# Patient Record
Sex: Male | Born: 1955
Health system: Southern US, Community
[De-identification: ages and names within clinical notes are randomized; demographics above are authoritative.]

## PROBLEM LIST (undated history)

## (undated) DIAGNOSIS — H409 Unspecified glaucoma: Secondary | ICD-10-CM

## (undated) DIAGNOSIS — T7840XA Allergy, unspecified, initial encounter: Secondary | ICD-10-CM

## (undated) DIAGNOSIS — E119 Type 2 diabetes mellitus without complications: Secondary | ICD-10-CM

## (undated) DIAGNOSIS — N400 Enlarged prostate without lower urinary tract symptoms: Secondary | ICD-10-CM

## (undated) DIAGNOSIS — E1169 Type 2 diabetes mellitus with other specified complication: Secondary | ICD-10-CM

## (undated) DIAGNOSIS — I10 Essential (primary) hypertension: Secondary | ICD-10-CM

## (undated) HISTORY — DX: Obesity, unspecified: E11.69

## (undated) HISTORY — DX: Benign prostatic hyperplasia without lower urinary tract symptoms: N40.0

## (undated) HISTORY — DX: Allergy, unspecified, initial encounter: T78.40XA

## (undated) HISTORY — DX: Type 2 diabetes mellitus without complications: E11.9

## (undated) HISTORY — DX: Unspecified glaucoma: H40.9

## (undated) HISTORY — PX: OTHER SURGICAL HISTORY: SHX169

## (undated) HISTORY — PX: TONSILLECTOMY: SUR1361

## (undated) HISTORY — DX: Essential (primary) hypertension: I10

## (undated) HISTORY — DX: Type 2 diabetes mellitus with other specified complication: E66.9

---

## 2015-01-14 DIAGNOSIS — F988 Other specified behavioral and emotional disorders with onset usually occurring in childhood and adolescence: Secondary | ICD-10-CM | POA: Insufficient documentation

## 2015-01-14 DIAGNOSIS — M62830 Muscle spasm of back: Secondary | ICD-10-CM | POA: Insufficient documentation

## 2015-01-14 DIAGNOSIS — E669 Obesity, unspecified: Secondary | ICD-10-CM | POA: Insufficient documentation

## 2015-01-14 DIAGNOSIS — L719 Rosacea, unspecified: Secondary | ICD-10-CM | POA: Insufficient documentation

## 2015-01-14 DIAGNOSIS — R030 Elevated blood-pressure reading, without diagnosis of hypertension: Secondary | ICD-10-CM | POA: Insufficient documentation

## 2015-01-15 DIAGNOSIS — R7989 Other specified abnormal findings of blood chemistry: Secondary | ICD-10-CM | POA: Insufficient documentation

## 2016-02-03 DIAGNOSIS — Z0000 Encounter for general adult medical examination without abnormal findings: Secondary | ICD-10-CM | POA: Insufficient documentation

## 2016-04-03 DIAGNOSIS — Z87898 Personal history of other specified conditions: Secondary | ICD-10-CM | POA: Insufficient documentation

## 2016-04-03 DIAGNOSIS — G8929 Other chronic pain: Secondary | ICD-10-CM | POA: Insufficient documentation

## 2018-01-17 ENCOUNTER — Encounter: Payer: Self-pay | Admitting: Family Medicine

## 2018-01-17 ENCOUNTER — Ambulatory Visit (INDEPENDENT_AMBULATORY_CARE_PROVIDER_SITE_OTHER): Payer: BLUE CROSS/BLUE SHIELD | Admitting: Family Medicine

## 2018-01-17 ENCOUNTER — Other Ambulatory Visit: Payer: Self-pay | Admitting: Family Medicine

## 2018-01-17 VITALS — BP 112/68 | HR 72 | Temp 98.0°F | Ht 73.0 in | Wt 254.0 lb

## 2018-01-17 DIAGNOSIS — Z125 Encounter for screening for malignant neoplasm of prostate: Secondary | ICD-10-CM | POA: Diagnosis not present

## 2018-01-17 DIAGNOSIS — Z0000 Encounter for general adult medical examination without abnormal findings: Secondary | ICD-10-CM

## 2018-01-17 DIAGNOSIS — Z1211 Encounter for screening for malignant neoplasm of colon: Secondary | ICD-10-CM

## 2018-01-17 DIAGNOSIS — N400 Enlarged prostate without lower urinary tract symptoms: Secondary | ICD-10-CM | POA: Insufficient documentation

## 2018-01-17 DIAGNOSIS — N401 Enlarged prostate with lower urinary tract symptoms: Secondary | ICD-10-CM

## 2018-01-17 DIAGNOSIS — Z114 Encounter for screening for human immunodeficiency virus [HIV]: Secondary | ICD-10-CM | POA: Diagnosis not present

## 2018-01-17 DIAGNOSIS — R350 Frequency of micturition: Secondary | ICD-10-CM

## 2018-01-17 DIAGNOSIS — R739 Hyperglycemia, unspecified: Secondary | ICD-10-CM

## 2018-01-17 LAB — COMPREHENSIVE METABOLIC PANEL
ALT: 26 U/L (ref 0–53)
AST: 16 U/L (ref 0–37)
Albumin: 3.9 g/dL (ref 3.5–5.2)
Alkaline Phosphatase: 75 U/L (ref 39–117)
BUN: 22 mg/dL (ref 6–23)
CO2: 30 meq/L (ref 19–32)
Calcium: 8.8 mg/dL (ref 8.4–10.5)
Chloride: 100 mEq/L (ref 96–112)
Creatinine, Ser: 0.92 mg/dL (ref 0.40–1.50)
GFR: 87.98 mL/min (ref >60.00)
Glucose, Bld: 180 mg/dL — ABNORMAL HIGH (ref 70–99)
POTASSIUM: 4.1 meq/L (ref 3.5–5.1)
Sodium: 138 mEq/L (ref 135–145)
Total Bilirubin: 1.1 mg/dL (ref 0.2–1.2)
Total Protein: 6.1 g/dL (ref 6.0–8.3)

## 2018-01-17 LAB — LIPID PANEL
Cholesterol: 150 mg/dL (ref 0–200)
HDL: 38 mg/dL — ABNORMAL LOW (ref >39.00)
LDL Cholesterol: 93 mg/dL (ref 0–99)
NONHDL: 111.52
Total CHOL/HDL Ratio: 4
Triglycerides: 95 mg/dL (ref 0.0–149.0)
VLDL: 19 mg/dL (ref 0.0–40.0)

## 2018-01-17 LAB — PSA: PSA: 1.29 ng/mL (ref 0.10–4.00)

## 2018-01-17 MED ORDER — TAMSULOSIN HCL 0.4 MG PO CAPS: 0.4000 mg | ORAL_CAPSULE | Freq: Every day | ORAL | 3 refills | Status: DC

## 2018-01-17 NOTE — Patient Instructions (Addendum)
Get me the info for when you got your Shingrix (shingles vaccine).  Give [place] 2-3 business days to get the results of your labs back.   Keep the diet clean and stay active.  Consider lifting weights to help with energy and functionality moving forward.  The biggest side effect from this medication is getting dizzy when you stand up. Stand up slowly.  Let us know if you need anything.

## 2018-01-17 NOTE — Progress Notes (Signed)
Chief Complaint  Patient presents with  . New Patient (Initial Visit)    [name] is here for a complete physical.   His last physical was >1 year ago.  Current diet: in general, an "OK" diet.  Current exercise: walking Weight trend: stable, has trouble losing weight they way he used to Daytime fatigue? No. Seat belt? Yes.    Health maintenance Colonoscopy- No Tetanus- Yes - 2 years ago HIV- No Hep C- No   Hx of freq urination, near incontinence and dribbling stream. Was placed on Flomax in past for kidney stone and it did wonders for his stream. Interested in this med. He has been screened with PSAs in past and all have been WNL. No bleeding or pain.    Past Medical History:  Diagnosis Date  . BPH (benign prostatic hyperplasia)   . Glaucoma   . Hypertension      History reviewed. No pertinent surgical history.  Medications  Current Outpatient Medications on File Prior to Visit  Medication Sig Dispense Refill  . amLODipine (NORVASC) 5 MG tablet Take 5 mg by mouth daily.    . Coenzyme Q10 (COQ10) 100 MG CAPS Take by mouth.    . Cyanocobalamin (VITAMIN B 12 PO) Take 1,000 mg by mouth daily.    . Ergocalciferol (VITAMIN D2) 50 MCG (2000 UT) TABS Take 2,000 mg by mouth daily.    . fexofenadine-pseudoephedrine (ALLEGRA-D 24) 180-240 MG 24 hr tablet Take 1 tablet by mouth daily.    Jon Townsend Oil 1000 MG CAPS Take 1,000 mg by mouth daily.    . Magnesium 400 MG CAPS Take 400 mg by mouth daily.    . metoprolol tartrate (LOPRESSOR) 50 MG tablet Take 50 mg by mouth 2 (two) times daily.    . TURMERIC CURCUMIN PO Take 750 capsules by mouth daily.     Allergies No Known Allergies  Family History Family History  Problem Relation Age of Onset  . Cancer Father   . Early death Father   . Diabetes Sister   . Heart disease Sister   . COPD Sister   . Diabetes Sister     Review of Systems: Constitutional:  no fevers Eye:  no recent significant change in  vision Ear/Nose/Mouth/Throat:  Ears:  no hearing loss Nose/Mouth/Throat:  no complaints of nasal congestion, no sore throat, +hoarseness (getting over cold) Cardiovascular:  no chest pain, no palpitations Respiratory:  no cough and no shortness of breath Gastrointestinal:  no abdominal pain, no change in bowel habits GU:  Male: negative for dysuria, frequency, and incontinence and positive for prostate symptoms Musculoskeletal/Extremities:  no pain, redness, or swelling of the joints Integumentary (Skin/Breast):  no abnormal skin lesions reported Neurologic:  no headaches Endocrine: No unexpected weight changes Hematologic/Lymphatic:  no abnormal bleeding  Exam BP 112/68 (BP Location: Left Arm, Patient Position: Sitting, Cuff Size: Large)   Pulse 72   Temp 98 F (36.7 C) (Oral)   Ht 6\' 1"  (1.854 m)   Wt 254 lb (115.2 kg)   SpO2 97%   BMI 33.51 kg/m  General:  well developed, well nourished, in no apparent distress Skin:  no significant moles, warts, or growths Head:  no masses, lesions, or tenderness Eyes:  pupils equal and round, sclera anicteric without injection Ears:  canals without lesions, TMs shiny without retraction, no obvious effusion, no erythema Nose:  nares patent, septum midline, mucosa normal Throat/Pharynx:  lips and gingiva without lesion; tongue and uvula midline; non-inflamed  pharynx; no exudates or postnasal drainage Neck: neck supple without adenopathy, thyromegaly, or masses Lungs:  clear to auscultation, breath sounds equal bilaterally, no respiratory distress Cardio:  regular rate and rhythm, no LE edema, no bruits Abdomen:  abdomen soft, nontender; bowel sounds normal; no masses or organomegaly Genital (male): circumcised penis, no lesions or discharge; testes present bilaterally without masses or tenderness Rectal: Deferred Musculoskeletal:  symmetrical muscle groups noted without atrophy or deformity Extremities:  no clubbing, cyanosis, or edema, no  deformities, no skin discoloration Neuro:  gait normal; deep tendon reflexes normal and symmetric Psych: well oriented with normal range of affect and appropriate judgment/insight  Assessment and Plan  Well adult exam - Plan: Comprehensive metabolic panel, Lipid panel  Benign prostatic hyperplasia with urinary frequency  Screening for prostate cancer - Plan: PSA  Screening for HIV (human immunodeficiency virus) - Plan: HIV Antibody (routine testing w rflx)  Screen for colon cancer - Plan: Ambulatory referral to Gastroenterology   Well 63 y.o. male. Counseled on diet and exercise. Counseled on risks and benefits of prostate cancer screening with PSA. The patient agrees to undergo testing. Immunizations, labs, and further orders as above. Follow up in 4 weeks to reck urination. The patient voiced understanding and agreement to the plan.  [place], DO 01/17/18 9:22 AM

## 2018-01-17 NOTE — Progress Notes (Signed)
Pre visit review using our clinic review tool, if applicable. No additional management support is needed unless otherwise documented below in the visit note. 

## 2018-01-18 ENCOUNTER — Other Ambulatory Visit (INDEPENDENT_AMBULATORY_CARE_PROVIDER_SITE_OTHER): Payer: BLUE CROSS/BLUE SHIELD

## 2018-01-18 ENCOUNTER — Encounter: Payer: Self-pay | Admitting: Family Medicine

## 2018-01-18 DIAGNOSIS — E1169 Type 2 diabetes mellitus with other specified complication: Secondary | ICD-10-CM | POA: Insufficient documentation

## 2018-01-18 DIAGNOSIS — R739 Hyperglycemia, unspecified: Secondary | ICD-10-CM

## 2018-01-18 DIAGNOSIS — E669 Obesity, unspecified: Secondary | ICD-10-CM

## 2018-01-18 DIAGNOSIS — E119 Type 2 diabetes mellitus without complications: Secondary | ICD-10-CM | POA: Insufficient documentation

## 2018-01-18 LAB — HIV ANTIBODY (ROUTINE TESTING W REFLEX): HIV 1&2 Ab, 4th Generation: NONREACTIVE

## 2018-01-18 LAB — HEMOGLOBIN A1C: Hgb A1c MFr Bld: 9.8 % — ABNORMAL HIGH (ref 4.6–6.5)

## 2018-01-23 ENCOUNTER — Encounter: Payer: Self-pay | Admitting: Family Medicine

## 2018-01-23 ENCOUNTER — Ambulatory Visit: Payer: BLUE CROSS/BLUE SHIELD | Admitting: Family Medicine

## 2018-01-23 VITALS — BP 118/64 | HR 76 | Temp 98.0°F | Ht 73.0 in | Wt 255.5 lb

## 2018-01-23 DIAGNOSIS — E1169 Type 2 diabetes mellitus with other specified complication: Secondary | ICD-10-CM | POA: Diagnosis not present

## 2018-01-23 DIAGNOSIS — E669 Obesity, unspecified: Secondary | ICD-10-CM | POA: Diagnosis not present

## 2018-01-23 DIAGNOSIS — Z23 Encounter for immunization: Secondary | ICD-10-CM

## 2018-01-23 LAB — GLUCOSE, POCT (MANUAL RESULT ENTRY): POC GLUCOSE: 327 mg/dL — AB (ref 70–99)

## 2018-01-23 LAB — MICROALBUMIN / CREATININE URINE RATIO
Creatinine,U: 134.8 mg/dL
MICROALB/CREAT RATIO: 0.5 mg/g (ref 0.0–30.0)
Microalb, Ur: <0.7 mg/dL (ref 0.0–1.9)

## 2018-01-23 MED ORDER — ATORVASTATIN CALCIUM 40 MG PO TABS: 40.0000 mg | ORAL_TABLET | Freq: Every day | ORAL | 3 refills | Status: DC

## 2018-01-23 MED ORDER — GLUCOSE BLOOD VI STRP: ORAL_STRIP | 3 refills | Status: DC

## 2018-01-23 MED ORDER — AZITHROMYCIN 250 MG PO TABS: ORAL_TABLET | ORAL | 0 refills | Status: DC

## 2018-01-23 MED ORDER — ONETOUCH ULTRASOFT LANCETS MISC: 3 refills | Status: DC

## 2018-01-23 MED ORDER — ONETOUCH VERIO W/DEVICE KIT: PACK | 0 refills | Status: DC

## 2018-01-23 MED ORDER — METFORMIN HCL 500 MG PO TABS: ORAL_TABLET | ORAL | 1 refills | Status: DC

## 2018-01-23 NOTE — Addendum Note (Signed)
Addended by: Sharon Seller B on: 01/23/2018 10:42 AM   Modules accepted: Orders

## 2018-01-23 NOTE — Patient Instructions (Signed)
Give [place] 2-3 business days to get the results of your labs back.   Aim to do some physical exertion for 150 minutes per week. This is typically divided into 5 days per week, 30 minutes per day. The activity should be enough to get your heart rate up. Anything is better than nothing if you have time constraints.  Call your eye doctor to let them know about your diagnosis and they will do a special exam once a year.  Healthy Eating Plan Many factors influence your heart health, including eating and exercise habits. Heart (coronary) risk increases with abnormal blood fat (lipid) levels. Heart-healthy meal planning includes limiting unhealthy fats, increasing healthy fats, and making other small dietary changes. This includes maintaining a healthy body weight to help keep lipid levels within a normal range.  WHAT IS MY PLAN?  Your health care provider recommends that you:  Drink a glass of water before meals to help with satiety.  Eat slowly.  An alternative to the water is to add Metamucil. This will help with satiety as well. It does contain calories, unlike water.  WHAT TYPES OF FAT SHOULD I CHOOSE?  Choose healthy fats more often. Choose monounsaturated and polyunsaturated fats, such as olive oil and canola oil, flaxseeds, walnuts, almonds, and seeds.  Eat more omega-3 fats. Good choices include salmon, mackerel, sardines, tuna, flaxseed oil, and ground flaxseeds. Aim to eat fish at least two times each week.  Avoid foods with partially hydrogenated oils in them. These contain trans fats. Examples of foods that contain trans fats are stick margarine, some tub margarines, cookies, crackers, and other baked goods. If you are going to avoid a fat, this is the one to avoid!  WHAT GENERAL GUIDELINES DO I NEED TO FOLLOW?  Check food labels carefully to identify foods with trans fats. Avoid these types of options when possible.  Fill one half of your plate with vegetables and green salads. Eat  4-5 servings of vegetables per day. A serving of vegetables equals 1 cup of raw leafy vegetables,  cup of raw or cooked cut-up vegetables, or  cup of vegetable juice.  Fill one fourth of your plate with whole grains. Look for the word "whole" as the first word in the ingredient list.  Fill one fourth of your plate with lean protein foods.  Eat 4-5 servings of fruit per day. A serving of fruit equals one medium whole fruit,  cup of dried fruit,  cup of fresh, frozen, or canned fruit. Try to avoid fruits in cups/syrups as the sugar content can be high.  Eat more foods that contain soluble fiber. Examples of foods that contain this type of fiber are apples, broccoli, carrots, beans, peas, and barley. Aim to get 20-30 g of fiber per day.  Eat more home-cooked food and less restaurant, buffet, and fast food.  Limit or avoid alcohol.  Limit foods that are high in starch and sugar.  Avoid fried foods when able.  Cook foods by using methods other than frying. Baking, boiling, grilling, and broiling are all great options. Other fat-reducing suggestions include: ? Removing the skin from poultry. ? Removing all visible fats from meats. ? Skimming the fat off of stews, soups, and gravies before serving them. ? Steaming vegetables in water or broth.  Lose weight if you are overweight. Losing just 5-10% of your initial body weight can help your overall health and prevent diseases such as diabetes and heart disease.  Increase your consumption of nuts,  legumes, and seeds to 4-5 servings per week. One serving of dried beans or legumes equals  cup after being cooked, one serving of nuts equals 1 ounces, and one serving of seeds equals  ounce or 1 tablespoon.  WHAT ARE GOOD FOODS CAN I EAT? Grains Grainy breads (try to find bread that is 3 g of fiber per slice or greater), oatmeal, light popcorn. Whole-grain cereals. Rice and pasta, including brown rice and those that are made with whole wheat.  Edamame pasta is a great alternative to grain pasta. It has a higher protein content. Try to avoid significant consumption of white bread, sugary cereals, or pastries/baked goods.  Vegetables All vegetables. Cooked white potatoes do not count as vegetables.  Fruits All fruits, but limit pineapple and bananas as these fruits have a higher sugar content.  Meats and Other Protein Sources Lean, well-trimmed beef, veal, pork, and lamb. Chicken and [place] without skin. All fish and shellfish. Wild duck, rabbit, pheasant, and venison. Egg whites or low-cholesterol egg substitutes. Dried beans, peas, lentils, and tofu.Seeds and most nuts.  Dairy Low-fat or nonfat cheeses, including ricotta, string, and mozzarella. Skim or 1% milk that is liquid, powdered, or evaporated. Buttermilk that is made with low-fat milk. Nonfat or low-fat yogurt. Soy/Almond milk are good alternatives if you cannot handle dairy.  Beverages Water is the best for you. Sports drinks with less sugar are more desirable unless you are a highly active athlete.  Sweets and Desserts Sherbets and fruit ices. Honey, jam, marmalade, jelly, and syrups. Dark chocolate.  Eat all sweets and desserts in moderation.  Fats and Oils Nonhydrogenated (trans-free) margarines. Vegetable oils, including soybean, sesame, sunflower, olive, peanut, safflower, corn, canola, and cottonseed. Salad dressings or mayonnaise that are made with a vegetable oil. Limit added fats and oils that you use for cooking, baking, salads, and as spreads.  Other Cocoa powder. Coffee and tea. Most condiments.  The items listed above may not be a complete list of recommended foods or beverages. Contact your dietitian for more options.

## 2018-01-23 NOTE — Progress Notes (Signed)
Chief Complaint  Patient presents with  . Diabetes    Subjective: Patient is a 63 y.o. male here for f/u a1c.  A1c 9.8 from last visit. Here to discuss next steps. Has questions about carbs/exercise/prognosis. He does not know how to use a glucometer. He does have an eye provider.   Objective: BP 118/64 (BP Location: Left Arm, Patient Position: Sitting, Cuff Size: Large)   Pulse 76   Temp 98 F (36.7 C) (Oral)   Ht 6\' 1"  (1.854 m)   Wt 255 lb 8 oz (115.9 kg)   SpO2 96%   BMI 33.71 kg/m  General: Awake, appears stated age Heart: 2+ DP pulses b/l feet Skin: No ext lesions noted Neuro: Sensation intact to pinprick b/l Lungs: No accessory muscle use Psych: Age appropriate judgment and insight, normal affect and mood  Assessment and Plan: Diabetes mellitus type 2 in obese (HCC) - Plan: Microalbumin / creatinine urine ratio, metFORMIN (GLUCOPHAGE) 500 MG tablet, HM DIABETES FOOT EXAM, atorvastatin (LIPITOR) 40 MG tablet  Start Metformin and statin. Glucometer and supplies called in. Counseled on diet and exercise, healthy diet handout given. Ft exam.  F/u in 3 mo.  The patient and his spouse voiced understanding and agreement to the plan.  Greater than 25 minutes were spent face to face with the patient with greater than 50% of this time spent counseling on diet, exercise, prognosis, complications of diabetes, and teaching with glucometer.   [place], DO 01/23/18  10:27 AM

## 2018-01-23 NOTE — Addendum Note (Signed)
Addended by: Sharon Seller B on: 01/23/2018 10:47 AM   Modules accepted: Orders

## 2018-01-25 ENCOUNTER — Encounter: Payer: Self-pay | Admitting: Gastroenterology

## 2018-01-29 ENCOUNTER — Ambulatory Visit (AMBULATORY_SURGERY_CENTER): Payer: Self-pay

## 2018-01-29 ENCOUNTER — Encounter: Payer: Self-pay | Admitting: Gastroenterology

## 2018-01-29 VITALS — Ht 73.0 in | Wt 252.2 lb

## 2018-01-29 DIAGNOSIS — Z1211 Encounter for screening for malignant neoplasm of colon: Secondary | ICD-10-CM

## 2018-01-29 MED ORDER — NA SULFATE-K SULFATE-MG SULF 17.5-3.13-1.6 GM/177ML PO SOLN: 1.0000 | Freq: Once | ORAL | 0 refills | Status: AC

## 2018-01-29 NOTE — Progress Notes (Signed)
Denies allergies to eggs or soy products. Denies complication of anesthesia or sedation. Denies use of weight loss medication. Denies use of O2.   Emmi instructions declined.   A 15.00 coupon for Suprep was given to the patient.

## 2018-02-12 ENCOUNTER — Encounter: Payer: Self-pay | Admitting: Family Medicine

## 2018-02-12 MED ORDER — AMLODIPINE BESYLATE 5 MG PO TABS: 5.0000 mg | ORAL_TABLET | Freq: Every day | ORAL | 1 refills | Status: DC

## 2018-02-13 ENCOUNTER — Ambulatory Visit (AMBULATORY_SURGERY_CENTER): Payer: BLUE CROSS/BLUE SHIELD | Admitting: Gastroenterology

## 2018-02-13 ENCOUNTER — Encounter: Payer: Self-pay | Admitting: Gastroenterology

## 2018-02-13 VITALS — BP 117/65 | HR 54 | Temp 97.8°F | Resp 15 | Ht 73.0 in | Wt 252.0 lb

## 2018-02-13 DIAGNOSIS — D122 Benign neoplasm of ascending colon: Secondary | ICD-10-CM

## 2018-02-13 DIAGNOSIS — Z1211 Encounter for screening for malignant neoplasm of colon: Secondary | ICD-10-CM

## 2018-02-13 MED ORDER — SODIUM CHLORIDE 0.9 % IV SOLN: 500.0000 mL | Freq: Once | INTRAVENOUS | Status: DC

## 2018-02-13 NOTE — Op Note (Signed)
[facility] Patient Name: Jon Townsend Procedure Date: 02/13/2018 11:40 AM MRN: [id number] Endoscopist: Remo Lipps P. Havery Moros , MD Age: 63 Referring MD:  Date of Birth: September 28, 1955 Gender: Male Account #: [id number] Procedure:                Colonoscopy Indications:              Screening for colorectal malignant neoplasm Medicines:                Monitored Anesthesia Care Procedure:                Pre-Anesthesia Assessment:                           - Prior to the procedure, a History and Physical                            was performed, and patient medications and                            allergies were reviewed. The patient's tolerance of                            previous anesthesia was also reviewed. The risks                            and benefits of the procedure and the sedation                            options and risks were discussed with the patient.                            All questions were answered, and informed consent                            was obtained. Prior Anticoagulants: The patient has                            taken no previous anticoagulant or antiplatelet                            agents. ASA Grade Assessment: II - A patient with                            mild systemic disease. After reviewing the risks                            and benefits, the patient was deemed in                            satisfactory condition to undergo the procedure.                           After obtaining informed consent, the colonoscope  was passed under direct vision. Throughout the                            procedure, the patient's blood pressure, pulse, and                            oxygen saturations were monitored continuously. The                            Colonoscope was introduced through the anus and                            advanced to the the cecum, identified by                            appendiceal orifice  and ileocecal valve. The                            colonoscopy was performed without difficulty. The                            patient tolerated the procedure well. The quality                            of the bowel preparation was fair. The ileocecal                            valve, appendiceal orifice, and rectum were                            photographed. Scope In: 11:47:37 AM Scope Out: 12:01:24 PM Scope Withdrawal Time: 0 hours 7 minutes 42 seconds  Total Procedure Duration: 0 hours 13 minutes 47 seconds  Findings:                 The perianal and digital rectal examinations were                            normal.                           A diminutive polyp was found in the ascending                            colon. The polyp was sessile. The polyp was removed                            with a cold snare. Resection and retrieval were                            complete.                           A moderate amount of semi-liquid stool was found at  the splenic flexure and in the cecum, making                            visualization difficult. There was residual seeds /                            nuts in these areas which could not be cleared,                            clogged the colonoscope. No large polyps or mass                            lesions noted but small or flat polyps may not have                            been appreciated.                           The exam was otherwise without abnormality. Complications:            No immediate complications. Estimated blood loss:                            Minimal. Estimated Blood Loss:     Estimated blood loss was minimal. Impression:               - Preparation of the colon was fair, particularly                            in dependant portions of the colon such as cecum                            and splenic flexure                           - One diminutive polyp in the ascending colon,                             removed with a cold snare. Resected and retrieved.                           - The examination was otherwise normal. Recommendation:           - Patient has a contact number available for                            emergencies. The signs and symptoms of potential                            delayed complications were discussed with the                            patient. Return to normal activities tomorrow.  Written discharge instructions were provided to the                            patient.                           - Resume previous diet.                           - Continue present medications.                           - Await pathology results.                           - Repeat colonoscopy within 1 year because the                            bowel preparation was suboptimal. Remo Lipps P. Coraline Talwar, MD 02/13/2018 12:06:43 PM This report has been signed electronically.

## 2018-02-13 NOTE — Patient Instructions (Signed)
Recommend repeat screening colonoscopy in one year.  Resume previous diet and medications today.  Return to normal activities tomorrow.    YOU HAD AN ENDOSCOPIC PROCEDURE TODAY AT [facility]:   Refer to the procedure report that was given to you for any specific questions about what was found during the examination.  If the procedure report does not answer your questions, please call your gastroenterologist to clarify.  If you requested that your care partner not be given the details of your procedure findings, then the procedure report has been included in a sealed envelope for you to review at your convenience later.  YOU SHOULD EXPECT: Some feelings of bloating in the abdomen. Passage of more gas than usual.  Walking can help get rid of the air that was put into your GI tract during the procedure and reduce the bloating. If you had a lower endoscopy (such as a colonoscopy or flexible sigmoidoscopy) you may notice spotting of blood in your stool or on the toilet paper. If you underwent a bowel prep for your procedure, you may not have a normal bowel movement for a few days.  Please Note:  You might notice some irritation and congestion in your nose or some drainage.  This is from the oxygen used during your procedure.  There is no need for concern and it should clear up in a day or so.  SYMPTOMS TO REPORT IMMEDIATELY:   Following lower endoscopy (colonoscopy or flexible sigmoidoscopy):  Excessive amounts of blood in the stool  Significant tenderness or worsening of abdominal pains  Swelling of the abdomen that is new, acute  Fever of 100F or higher    For urgent or emergent issues, a gastroenterologist can be reached at any hour by calling [phone].   DIET:  We do recommend a small meal at first, but then you may proceed to your regular diet.  Drink plenty of fluids but you should avoid alcoholic beverages for 24 hours.  ACTIVITY:  You should plan to take it  easy for the rest of today and you should NOT DRIVE or use heavy machinery until tomorrow (because of the sedation medicines used during the test).    FOLLOW UP: Our staff will call the number listed on your records the next business day following your procedure to check on you and address any questions or concerns that you may have regarding the information given to you following your procedure. If we do not reach you, we will leave a message.  However, if you are feeling well and you are not experiencing any problems, there is no need to return our call.  We will assume that you have returned to your regular daily activities without incident.  If any biopsies were taken you will be contacted by phone or by letter within the next 1-3 weeks.  Please call us at [phone] if you have not heard about the biopsies in 3 weeks.    SIGNATURES/CONFIDENTIALITY: You and/or your care partner have signed paperwork which will be entered into your electronic medical record.  These signatures attest to the fact that that the information above on your After Visit Summary has been reviewed and is understood.  Full responsibility of the confidentiality of this discharge information lies with you and/or your care-partner.

## 2018-02-13 NOTE — Progress Notes (Signed)
Called to room to assist during endoscopic procedure.  Patient ID and intended procedure confirmed with present staff. Received instructions for my participation in the procedure from the performing physician.  

## 2018-02-13 NOTE — Progress Notes (Signed)
A/ox3, pleased with MAC, report to RN 

## 2018-02-14 ENCOUNTER — Telehealth: Payer: Self-pay

## 2018-02-14 ENCOUNTER — Ambulatory Visit: Payer: BLUE CROSS/BLUE SHIELD | Admitting: Family Medicine

## 2018-02-14 NOTE — Telephone Encounter (Signed)
Attempted to reach patient for post-procedure f/u call. No answer. Left message that we will make another attempt to call him again later today and for him to please not hesitate to call us if he has any questions/concerns regarding his care.

## 2018-02-14 NOTE — Telephone Encounter (Signed)
  Follow up Call-  Call back number 02/13/2018  Post procedure Call Back phone  # [phone]  Permission to leave phone message Yes     Patient questions:  Do you have a fever, pain , or abdominal swelling? No. Pain Score  0 *  Have you tolerated food without any problems? Yes.    Have you been able to return to your normal activities? Yes.    Do you have any questions about your discharge instructions: Diet   No. Medications  No. Follow up visit  No.  Do you have questions or concerns about your Care? No.  Actions: * If pain score is 4 or above: No action needed, pain <4.

## 2018-03-07 LAB — HM DIABETES EYE EXAM

## 2018-03-21 ENCOUNTER — Encounter: Payer: Self-pay | Admitting: Family Medicine

## 2018-03-22 MED ORDER — METOPROLOL TARTRATE 50 MG PO TABS: 50.0000 mg | ORAL_TABLET | Freq: Two times a day (BID) | ORAL | 1 refills | Status: DC

## 2018-04-03 ENCOUNTER — Other Ambulatory Visit: Payer: Self-pay | Admitting: Family Medicine

## 2018-04-03 DIAGNOSIS — E1169 Type 2 diabetes mellitus with other specified complication: Secondary | ICD-10-CM

## 2018-04-03 DIAGNOSIS — E669 Obesity, unspecified: Principal | ICD-10-CM

## 2018-04-03 MED ORDER — METFORMIN HCL 500 MG PO TABS: ORAL_TABLET | ORAL | 3 refills | Status: DC

## 2018-04-23 ENCOUNTER — Encounter: Payer: Self-pay | Admitting: Family Medicine

## 2018-04-25 ENCOUNTER — Ambulatory Visit: Payer: BLUE CROSS/BLUE SHIELD | Admitting: Family Medicine

## 2018-04-26 ENCOUNTER — Other Ambulatory Visit: Payer: Self-pay

## 2018-04-26 ENCOUNTER — Ambulatory Visit (INDEPENDENT_AMBULATORY_CARE_PROVIDER_SITE_OTHER): Payer: BLUE CROSS/BLUE SHIELD | Admitting: Family Medicine

## 2018-04-26 ENCOUNTER — Encounter: Payer: Self-pay | Admitting: Family Medicine

## 2018-04-26 DIAGNOSIS — I10 Essential (primary) hypertension: Secondary | ICD-10-CM | POA: Diagnosis not present

## 2018-04-26 DIAGNOSIS — E669 Obesity, unspecified: Secondary | ICD-10-CM

## 2018-04-26 DIAGNOSIS — E1169 Type 2 diabetes mellitus with other specified complication: Secondary | ICD-10-CM

## 2018-04-26 NOTE — Progress Notes (Addendum)
Subjective:   Chief Complaint  Patient presents with  . Follow-up    diabetes    Jon Townsend is a 63 y.o. male here for follow-up of diabetes. Due to outbreak, we are interacting via web portal for an electronic face-to-face visit. I verified patient's ID using 2 identifiers.   Jon Townsend's self monitored glucose range is low 100's Patient denies hypoglycemic reactions. Patient does not require insulin.   Medications include: Metformin 1000 mg bid Exercise: walking, exercise bands Diet improved, has lost 13 lbs since last visit  Hypertension Patient presents for hypertension follow up. He does monitor home blood pressures. Blood pressures ranging on average from 120-130's/60-70's. He is compliant with medications- Norvasc 10 mg/d, lopressor 25 mg bid. Patient has these side effects of medication: none He is adhering to a healthy diet overall. Exercise: walking   Past Medical History:  Diagnosis Date  . Allergy   . BPH (benign prostatic hyperplasia)   . Diabetes mellitus type 2 in obese ([place])   . Glaucoma   . Hypertension      Related testing: Date of retinal exam: was scheduled then cancelled due to COVID-19 Pneumovax: done Flu Shot: done  Review of Systems: Pulmonary:  No SOB Cardiovascular:  No chest pain  Objective:  No conversational dyspnea Age appropriate judgment and insight Nml affect and mood  Assessment:   Diabetes mellitus type 2 in obese ([place]) - Plan: metFORMIN (GLUCOPHAGE) 500 MG tablet, Hemoglobin A1c  Essential hypertension   Plan:   Orders as above. Cont meds. Counseled on diet and exercise. He is doing well.  Cont BP meds.  F/u in 3-6 mo pending A1c. The patient voiced understanding and agreement to the plan.  [place], DO 04/26/18 4:17 PM

## 2018-05-20 ENCOUNTER — Other Ambulatory Visit: Payer: Self-pay | Admitting: Family Medicine

## 2018-06-20 ENCOUNTER — Other Ambulatory Visit: Payer: Self-pay | Admitting: Family Medicine

## 2018-06-20 ENCOUNTER — Encounter: Payer: Self-pay | Admitting: Family Medicine

## 2018-06-21 MED ORDER — TAMSULOSIN HCL 0.4 MG PO CAPS: 0.4000 mg | ORAL_CAPSULE | Freq: Every day | ORAL | 1 refills | Status: DC

## 2018-08-04 ENCOUNTER — Encounter: Payer: Self-pay | Admitting: Family Medicine

## 2018-08-05 ENCOUNTER — Other Ambulatory Visit: Payer: Self-pay | Admitting: Family Medicine

## 2018-08-05 DIAGNOSIS — E1169 Type 2 diabetes mellitus with other specified complication: Secondary | ICD-10-CM

## 2018-08-05 DIAGNOSIS — E669 Obesity, unspecified: Secondary | ICD-10-CM

## 2018-08-05 MED ORDER — METFORMIN HCL ER 500 MG PO TB24: 1000.0000 mg | ORAL_TABLET | Freq: Every day | ORAL | 5 refills | Status: DC

## 2018-08-05 MED ORDER — METFORMIN HCL 500 MG PO TABS: 1000.0000 mg | ORAL_TABLET | Freq: Two times a day (BID) | ORAL | 3 refills | Status: DC

## 2018-08-19 ENCOUNTER — Encounter: Payer: Self-pay | Admitting: Family Medicine

## 2018-08-19 ENCOUNTER — Other Ambulatory Visit: Payer: Self-pay

## 2018-08-19 ENCOUNTER — Ambulatory Visit: Payer: BLUE CROSS/BLUE SHIELD | Admitting: Family Medicine

## 2018-08-19 VITALS — BP 110/72 | HR 71 | Temp 97.8°F | Ht 73.0 in | Wt 245.2 lb

## 2018-08-19 DIAGNOSIS — M6751 Plica syndrome, right knee: Secondary | ICD-10-CM

## 2018-08-19 DIAGNOSIS — E669 Obesity, unspecified: Secondary | ICD-10-CM | POA: Diagnosis not present

## 2018-08-19 DIAGNOSIS — E1169 Type 2 diabetes mellitus with other specified complication: Secondary | ICD-10-CM

## 2018-08-19 MED ORDER — PREDNISONE 20 MG PO TABS: 40.0000 mg | ORAL_TABLET | Freq: Every day | ORAL | 0 refills | Status: AC

## 2018-08-19 MED ORDER — FEXOFENADINE-PSEUDOEPHED ER 180-240 MG PO TB24: 1.0000 | ORAL_TABLET | Freq: Every day | ORAL | 1 refills | Status: DC

## 2018-08-19 NOTE — Patient Instructions (Addendum)
Ice/cold pack over area for 10-15 min twice daily.  Give [place] 2-3 business days to get the results of your labs back.   OK to take Tylenol 1000 mg (2 extra strength tabs) or 975 mg (3 regular strength tabs) every 6 hours as needed.  No more ibuprofen.  If no improvement over next week, return to clinic and we will do injection.   Let us know if you need anything.

## 2018-08-19 NOTE — Progress Notes (Signed)
Musculoskeletal Exam  Patient: Jon Townsend DOB: [birth year]  DOS: 08/19/2018  SUBJECTIVE:  Chief Complaint:   Chief Complaint  Patient presents with  . Knee Pain    right knee swelling    Jon Townsend is a 63 y.o.  male for evaluation and treatment of R knee pain.   Onset:  2 weeks ago. No inj or change in activity.  Location: medial R knee Character:  aching and sharp  Progression of issue:  is unchanged Associated symptoms: swelling, worse at night or before bed, decreased ROM Treatment: to date has been icing, OTC NSAIDS and salon pas.   Neurovascular symptoms: no  ROS: Musculoskeletal/Extremities: +R knee pain  Past Medical History:  Diagnosis Date  . Allergy   . BPH (benign prostatic hyperplasia)   . Diabetes mellitus type 2 in obese ([place])   . Glaucoma   . Hypertension     Objective: VITAL SIGNS: BP 110/72 (BP Location: Left Arm, Patient Position: Sitting, Cuff Size: Large)   Pulse 71   Temp 97.8 F (36.6 C) (Oral)   Ht 6\' 1"  (1.854 m)   Wt 245 lb 4 oz (111.2 kg)   SpO2 97%   BMI 32.36 kg/m  Constitutional: Well formed, well developed. No acute distress. Cardiovascular: Brisk cap refill Thorax & Lungs: No accessory muscle use Musculoskeletal: R knee.   Normal active range of motion: yes.   Normal passive range of motion: yes Tenderness to palpation: yes over medial fem condyle, thin band of soft tissue that is ttp Deformity: no Ecchymosis: no Tests positive: none Tests negative: pat app/grind, ant/post drawer, Stine's, varus/valgus Neurologic: Normal sensory function. No focal deficits noted.  Psychiatric: Normal mood. Age appropriate judgment and insight. Alert & oriented x 3.    Assessment:  Synovial plica syndrome of right knee - Plan: predniSONE (DELTASONE) 20 MG tablet, ice, activity as tolerated. If no better in 1 week, will try injection.  Diabetes mellitus type 2 in obese ([place]) - Plan: Hemoglobin A1c  Plan: Orders as above. F/u  pending results. The patient voiced understanding and agreement to the plan.   [place], DO 08/19/18  2:39 PM

## 2018-08-20 LAB — HEMOGLOBIN A1C: Hgb A1c MFr Bld: 6.4 % (ref 4.6–6.5)

## 2018-08-28 ENCOUNTER — Other Ambulatory Visit: Payer: Self-pay | Admitting: Family Medicine

## 2018-09-05 LAB — HM DIABETES EYE EXAM

## 2018-09-13 ENCOUNTER — Encounter: Payer: Self-pay | Admitting: Family Medicine

## 2018-12-01 ENCOUNTER — Other Ambulatory Visit: Payer: Self-pay | Admitting: Family Medicine

## 2018-12-03 ENCOUNTER — Other Ambulatory Visit: Payer: Self-pay | Admitting: Family Medicine

## 2018-12-03 DIAGNOSIS — E1169 Type 2 diabetes mellitus with other specified complication: Secondary | ICD-10-CM

## 2018-12-06 ENCOUNTER — Other Ambulatory Visit: Payer: Self-pay | Admitting: Family Medicine

## 2018-12-18 ENCOUNTER — Other Ambulatory Visit: Payer: Self-pay | Admitting: Family Medicine

## 2019-01-14 ENCOUNTER — Telehealth: Payer: BC Managed Care – PPO | Admitting: Family

## 2019-01-14 DIAGNOSIS — B9689 Other specified bacterial agents as the cause of diseases classified elsewhere: Secondary | ICD-10-CM | POA: Diagnosis not present

## 2019-01-14 DIAGNOSIS — J019 Acute sinusitis, unspecified: Secondary | ICD-10-CM

## 2019-01-14 MED ORDER — AMOXICILLIN-POT CLAVULANATE 875-125 MG PO TABS: 1.0000 | ORAL_TABLET | Freq: Two times a day (BID) | ORAL | 0 refills | Status: DC

## 2019-01-14 NOTE — Progress Notes (Signed)

## 2019-01-22 ENCOUNTER — Telehealth: Payer: BC Managed Care – PPO | Admitting: Nurse Practitioner

## 2019-01-22 DIAGNOSIS — J00 Acute nasopharyngitis [common cold]: Secondary | ICD-10-CM | POA: Diagnosis not present

## 2019-01-22 MED ORDER — FLUTICASONE PROPIONATE 50 MCG/ACT NA SUSP: 2.0000 | Freq: Every day | NASAL | 6 refills | Status: DC

## 2019-01-22 NOTE — Progress Notes (Signed)
We are sorry you are not feeling well.  Here is how we plan to help!  Based on what you have shared with me, it looks like you may have a viral upper respiratory infection.  Upper respiratory infections are caused by a large number of viruses; however, rhinovirus is the most common cause.  * the fact that you have been on augmentin and you still are no better, tells me that this was a virus and not a bacterial upper resp infection. Antibiotics do not work for viruses. [place] resp infections last for 10 days no matter what you do. See plan of care below.  Symptoms vary from person to person, with common symptoms including sore throat, cough, fatigue or lack of energy and feeling of general discomfort.  A low-grade fever of up to 100.4 may present, but is often uncommon.  Symptoms vary however, and are closely related to a person's age or underlying illnesses.  The most common symptoms associated with an upper respiratory infection are nasal discharge or congestion, cough, sneezing, headache and pressure in the ears and face.  These symptoms usually persist for about 3 to 10 days, but can last up to 2 weeks.  It is important to know that upper respiratory infections do not cause serious illness or complications in most cases.    Upper respiratory infections can be transmitted from person to person, with the most common method of transmission being a person's hands.  The virus is able to live on the skin and can infect other persons for up to 2 hours after direct contact.  Also, these can be transmitted when someone coughs or sneezes; thus, it is important to cover the mouth to reduce this risk.  To keep the spread of the illness at [place], good hand hygiene is very important.  This is an infection that is most likely caused by a virus. There are no specific treatments other than to help you with the symptoms until the infection runs its course.  We are sorry you are not feeling well.  Here is how we plan to  help!   For nasal congestion, you may use an oral decongestants such as Mucinex D or if you have glaucoma or high blood pressure use plain Mucinex.  Saline nasal spray or nasal drops can help and can safely be used as often as needed for congestion.  For your congestion, I have prescribed Fluticasone nasal spray one spray in each nostril twice a day  If you do not have a history of heart disease, hypertension, diabetes or thyroid disease, prostate/bladder issues or glaucoma, you may also use Sudafed to treat nasal congestion.  It is highly recommended that you consult with a pharmacist or your primary care physician to ensure this medication is safe for you to take.     If you have a cough, you may use cough suppressants such as Delsym and Robitussin.  If you have glaucoma or high blood pressure, you can also use Coricidin HBP.     If you have a sore or scratchy throat, use a saltwater gargle-  to  teaspoon of salt dissolved in a 4-ounce to 8-ounce glass of warm water.  Gargle the solution for approximately 15-30 seconds and then spit.  It is important not to swallow the solution.  You can also use throat lozenges/cough drops and Chloraseptic spray to help with throat pain or discomfort.  Warm or cold liquids can also be helpful in relieving throat pain.  For  headache, pain or general discomfort, you can use Ibuprofen or Tylenol as directed.   Some authorities believe that zinc sprays or the use of Echinacea may shorten the course of your symptoms.   HOME CARE . Only take medications as instructed by your medical team. . Be sure to drink plenty of fluids. Water is fine as well as fruit juices, sodas and electrolyte beverages. You may want to stay away from caffeine or alcohol. If you are nauseated, try taking small sips of liquids. How do you know if you are getting enough fluid? Your urine should be a pale yellow or almost colorless. . Get rest. . Taking a steamy shower or using a humidifier  may help nasal congestion and ease sore throat pain. You can place a towel over your head and breathe in the steam from hot water coming from a faucet. . Using a saline nasal spray works much the same way. . Cough drops, hard candies and sore throat lozenges may ease your cough. . Avoid close contacts especially the very young and the elderly . Cover your mouth if you cough or sneeze . Always remember to wash your hands.   GET HELP RIGHT AWAY IF: . You develop worsening fever. . If your symptoms do not improve within 10 days . You develop yellow or green discharge from your nose over 3 days. . You have coughing fits . You develop a severe head ache or visual changes. . You develop shortness of breath, difficulty breathing or start having chest pain . Your symptoms persist after you have completed your treatment plan  MAKE SURE YOU   Understand these instructions.  Will watch your condition.  Will get help right away if you are not doing well or get worse.  Your e-visit answers were reviewed by a board certified advanced clinical practitioner to complete your personal care plan. Depending upon the condition, your plan could have included both over the counter or prescription medications. Please review your pharmacy choice. If there is a problem, you may call our nursing hot line at and have the prescription routed to another pharmacy. Your safety is important to [place]. If you have drug allergies check your prescription carefully.   You can use MyChart to ask questions about today's visit, request a non-urgent call back, or ask for a work or school excuse for 24 hours related to this e-Visit. If it has been greater than 24 hours you will need to follow up with your provider, or enter a new e-Visit to address those concerns. You will get an e-mail in the next two days asking about your experience.  I hope that your e-visit has been valuable and will speed your recovery. Thank you for using  e-visits.   5-10 minutes spent reviewing and documenting in chart.

## 2019-01-28 ENCOUNTER — Other Ambulatory Visit: Payer: Self-pay | Admitting: Family Medicine

## 2019-01-28 DIAGNOSIS — E669 Obesity, unspecified: Secondary | ICD-10-CM

## 2019-01-28 DIAGNOSIS — E1169 Type 2 diabetes mellitus with other specified complication: Secondary | ICD-10-CM

## 2019-03-02 ENCOUNTER — Other Ambulatory Visit: Payer: Self-pay | Admitting: Family Medicine

## 2019-03-10 ENCOUNTER — Other Ambulatory Visit: Payer: Self-pay | Admitting: Family Medicine

## 2019-03-10 DIAGNOSIS — E1169 Type 2 diabetes mellitus with other specified complication: Secondary | ICD-10-CM

## 2019-03-11 ENCOUNTER — Telehealth: Payer: Self-pay | Admitting: Family Medicine

## 2019-03-11 NOTE — Telephone Encounter (Signed)
Patient states recently diagnosis with covid on Sunday Feb 28,2021. Please advise patient on what do next. Patient states that he is concerned about his diabetes having covid .

## 2019-03-12 ENCOUNTER — Other Ambulatory Visit: Payer: Self-pay

## 2019-03-12 ENCOUNTER — Other Ambulatory Visit: Payer: Self-pay | Admitting: Adult Health

## 2019-03-12 ENCOUNTER — Encounter: Payer: Self-pay | Admitting: Family Medicine

## 2019-03-12 ENCOUNTER — Other Ambulatory Visit: Payer: Self-pay | Admitting: Internal Medicine

## 2019-03-12 ENCOUNTER — Ambulatory Visit (INDEPENDENT_AMBULATORY_CARE_PROVIDER_SITE_OTHER): Payer: BC Managed Care – PPO | Admitting: Family Medicine

## 2019-03-12 DIAGNOSIS — U071 COVID-19: Secondary | ICD-10-CM

## 2019-03-12 MED ORDER — SODIUM CHLORIDE 0.9 % IV SOLN
700.0000 mg | Freq: Once | INTRAVENOUS | Status: AC
  Administered 2019-03-13: 700 mg via INTRAVENOUS
  Filled 2019-03-12: qty 20

## 2019-03-12 MED ORDER — BENZONATATE 100 MG PO CAPS: 100.0000 mg | ORAL_CAPSULE | Freq: Three times a day (TID) | ORAL | 0 refills | Status: DC | PRN

## 2019-03-12 MED ORDER — ONDANSETRON 4 MG PO TBDP: 4.0000 mg | ORAL_TABLET | Freq: Three times a day (TID) | ORAL | 0 refills | Status: DC | PRN

## 2019-03-12 NOTE — Progress Notes (Signed)
Chief Complaint  Patient presents with  . Covid Exposure    tested postive on 2/28 , runny nose and coughing and chills.Marland Kitchen and wants to do antibody infusion.    [name] here for URI complaints. Due to COVID-19 pandemic, we are interacting via web portal for an electronic face-to-face visit. I verified patient's ID using 2 identifiers. Patient agreed to proceed with visit via this method. Patient is at home, I am at office. Patient and I are present for visit. His spouse also nearby.   Duration: 4 days  Associated symptoms: sinus congestion, low grade fevers, rhinorrhea, sore throat, chest tightness, myalgia and nausea, cough Denies: sinus pain, itchy watery eyes, ear pain, ear drainage, wheezing, shortness of breath and vomiting, diarrhea Treatment to date: Vit C, ASA, Tylenol Sick contacts: Yes- likely sick contacts at work  ROS:  HEENT: As noted in HPI Lungs: No SOB  Past Medical History:  Diagnosis Date  . Allergy   . BPH (benign prostatic hyperplasia)   . Diabetes mellitus type 2 in obese ([place])   . Glaucoma   . Hypertension    Exam No conversational dyspnea Age appropriate judgment and insight Nml affect and mood  COVID-19 - Plan: ondansetron (ZOFRAN-ODT) 4 MG disintegrating tablet, benzonatate (TESSALON) 100 MG capsule  Requesting infusions. Will reach out to infusion center. Deferred management decision to them as I am not certain he meets criteria.  Continue to push fluids, practice good hand hygiene, cover mouth when coughing. F/u prn. If starting to experience fevers, shaking, or shortness of breath, seek immediate care. Pt voiced understanding and agreement to the plan.  [place], DO 03/12/19 12:00 PM

## 2019-03-12 NOTE — Progress Notes (Signed)
  I connected by phone with Jon Townsend on 03/12/2019 at 1:55 PM to discuss the potential use of an new treatment for mild to moderate COVID-19 viral infection in non-hospitalized patients.  This patient is a 64 y.o. male that meets the FDA criteria for Emergency Use Authorization of bamlanivimab or casirivimab\imdevimab.  Has a (+) direct SARS-CoV-2 viral test result  Has mild or moderate COVID-19   Is ? 66 years of age and weighs ? 40 kg  Is NOT hospitalized due to COVID-19  Is NOT requiring oxygen therapy or requiring an increase in baseline oxygen flow rate due to COVID-19  Is within 10 days of symptom onset  Has at least one of the high risk factor(s) for progression to severe COVID-19 and/or hospitalization as defined in EUA.  Specific high risk criteria : Diabetes, htn, 65yo   I have spoken and communicated the following to the patient or parent/caregiver:  1. FDA has authorized the emergency use of bamlanivimab and casirivimab\imdevimab for the treatment of mild to moderate COVID-19 in adults and pediatric patients with positive results of direct SARS-CoV-2 viral testing who are 46 years of age and older weighing at least 40 kg, and who are at high risk for progressing to severe COVID-19 and/or hospitalization.  2. The significant known and potential risks and benefits of bamlanivimab and casirivimab\imdevimab, and the extent to which such potential risks and benefits are unknown.  3. Information on available alternative treatments and the risks and benefits of those alternatives, including clinical trials.  4. Patients treated with bamlanivimab and casirivimab\imdevimab should continue to self-isolate and use infection control measures (e.g., wear mask, isolate, social distance, avoid sharing personal items, clean and disinfect "high touch" surfaces, and frequent handwashing) according to CDC guidelines.   5. The patient or parent/caregiver has the option to accept or refuse  bamlanivimab or casirivimab\imdevimab .  After reviewing this information with the patient, The patient agreed to proceed with receiving the bamlanimivab infusion and will be provided a copy of the Fact sheet prior to receiving the infusion.   Infusion scheduled for 3/4 at 1030. Day 7 of symptoms  Alan Ripper, NP-C [place]

## 2019-03-12 NOTE — Telephone Encounter (Signed)
Contacted pt and he stated that he is feeling congested. He was prescribed prednisone and amoxicillin from an Urgent Care ([facility]?).  No fever currently. Pt did have chills last night and was not able to sleep very well.   Pt stated that he is scheduled for Friday with Percell Miller and would like to do virtual visit with Dr. Nani Ravens if possible.   Pt also asked about getting the infusion. If you feel the pt is a candidate a staff message can be sent to Kathrine Haddock, NP.

## 2019-03-12 NOTE — Telephone Encounter (Signed)
DONE

## 2019-03-12 NOTE — Telephone Encounter (Signed)
OK to put in at [phone] today. Ty.

## 2019-03-13 ENCOUNTER — Encounter (HOSPITAL_COMMUNITY): Payer: Self-pay

## 2019-03-13 ENCOUNTER — Ambulatory Visit (HOSPITAL_COMMUNITY)
Admission: RE | Admit: 2019-03-13 | Discharge: 2019-03-13 | Disposition: A | Discharge status: Home / Self Care | Payer: BC Managed Care – PPO | Source: Ambulatory Visit | Attending: Pulmonary Disease | Admitting: Pulmonary Disease

## 2019-03-13 DIAGNOSIS — U071 COVID-19: Secondary | ICD-10-CM | POA: Insufficient documentation

## 2019-03-13 MED ORDER — METHYLPREDNISOLONE SODIUM SUCC 125 MG IJ SOLR: 125.0000 mg | Freq: Once | INTRAMUSCULAR | Status: DC | PRN

## 2019-03-13 MED ORDER — ALBUTEROL SULFATE HFA 108 (90 BASE) MCG/ACT IN AERS: 2.0000 | INHALATION_SPRAY | Freq: Once | RESPIRATORY_TRACT | Status: DC | PRN

## 2019-03-13 MED ORDER — EPINEPHRINE 0.3 MG/0.3ML IJ SOAJ: 0.3000 mg | Freq: Once | INTRAMUSCULAR | Status: DC | PRN

## 2019-03-13 MED ORDER — SODIUM CHLORIDE 0.9 % IV SOLN
INTRAVENOUS | Status: DC | PRN
  Administered 2019-03-13: 250 mL via INTRAVENOUS

## 2019-03-13 MED ORDER — FAMOTIDINE IN NACL 20-0.9 MG/50ML-% IV SOLN: 20.0000 mg | Freq: Once | INTRAVENOUS | Status: DC | PRN

## 2019-03-13 MED ORDER — DIPHENHYDRAMINE HCL 50 MG/ML IJ SOLN: 50.0000 mg | Freq: Once | INTRAMUSCULAR | Status: DC | PRN

## 2019-03-13 NOTE — Progress Notes (Signed)
  Diagnosis: COVID-19  Physician:  Procedure: Covid Infusion Clinic Med: bamlanivimab infusion - Provided patient with bamlanimivab fact sheet for patients, parents and caregivers prior to infusion.  Complications: No immediate complications noted.  Discharge: Discharged home   [place], [place] 03/13/2019

## 2019-03-13 NOTE — Discharge Instructions (Signed)
10 Things You Can Do to Manage Your COVID-19 Symptoms at Home If you have possible or confirmed COVID-19: 1. Stay home from work and school. And stay away from other public places. If you must go out, avoid using any kind of public transportation, ridesharing, or taxis. 2. Monitor your symptoms carefully. If your symptoms get worse, call your healthcare provider immediately. 3. Get rest and stay hydrated. 4. If you have a medical appointment, call the healthcare provider ahead of time and tell them that you have or may have COVID-19. 5. For medical emergencies, call 911 and notify the dispatch personnel that you have or may have COVID-19. 6. Cover your cough and sneezes with a tissue or use the inside of your elbow. 7. Wash your hands often with soap and water for at least 20 seconds or clean your hands with an alcohol-based hand sanitizer that contains at least 60% alcohol. 8. As much as possible, stay in a specific room and away from other people in your home. Also, you should use a separate bathroom, if available. If you need to be around other people in or outside of the home, wear a mask. 9. Avoid sharing personal items with other people in your household, like dishes, towels, and bedding. 10. Clean all surfaces that are touched often, like counters, tabletops, and doorknobs. Use household cleaning sprays or wipes according to the label instructions. [email or web address] 07/10/2018 This information is not intended to replace advice given to you by your health care provider. Make sure you discuss any questions you have with your health care provider. Document Revised: 12/12/2018 Document Reviewed: 12/12/2018 Elsevier Patient Education  [place]. What types of side effects do monoclonal antibody drugs cause?  Common side effects  In general, the more common side effects caused by monoclonal antibody drugs include: . Allergic reactions, such as hives or itching . Flu-like signs and  symptoms, including chills, fatigue, fever, and muscle aches and pains . Nausea, vomiting . Diarrhea . Skin rashes . Low blood pressure   The CDC is recommending patients who receive monoclonal antibody treatments wait at least 90 days before being vaccinated.  Currently, there are no data on the safety and efficacy of mRNA COVID-19 vaccines in persons who received monoclonal antibodies or convalescent plasma as part of COVID-19 treatment. Based on the estimated half-life of such therapies as well as evidence suggesting that reinfection is uncommon in the 90 days after initial infection, vaccination should be deferred for at least 90 days, as a precautionary measure until additional information becomes available, to avoid interference of the antibody treatment with vaccine-induced immune responses.    03/13/2019

## 2019-03-14 ENCOUNTER — Ambulatory Visit: Payer: BC Managed Care – PPO | Admitting: Family Medicine

## 2019-03-14 ENCOUNTER — Ambulatory Visit: Payer: BC Managed Care – PPO | Admitting: Medical

## 2019-03-20 ENCOUNTER — Encounter: Payer: Self-pay | Admitting: Family Medicine

## 2019-03-21 ENCOUNTER — Encounter: Payer: Self-pay | Admitting: Family Medicine

## 2019-03-24 ENCOUNTER — Other Ambulatory Visit: Payer: Self-pay | Admitting: Family Medicine

## 2019-04-30 ENCOUNTER — Other Ambulatory Visit: Payer: Self-pay | Admitting: Family Medicine

## 2019-04-30 DIAGNOSIS — E1169 Type 2 diabetes mellitus with other specified complication: Secondary | ICD-10-CM

## 2019-06-05 ENCOUNTER — Other Ambulatory Visit: Payer: Self-pay | Admitting: Family Medicine

## 2019-06-05 DIAGNOSIS — E669 Obesity, unspecified: Secondary | ICD-10-CM

## 2019-06-26 ENCOUNTER — Other Ambulatory Visit: Payer: Self-pay | Admitting: Family Medicine

## 2019-07-03 LAB — HM DIABETES EYE EXAM

## 2019-07-08 ENCOUNTER — Encounter: Payer: Self-pay | Admitting: *Deleted

## 2019-08-04 ENCOUNTER — Other Ambulatory Visit: Payer: Self-pay | Admitting: Family Medicine

## 2019-08-04 DIAGNOSIS — E1169 Type 2 diabetes mellitus with other specified complication: Secondary | ICD-10-CM

## 2019-08-04 MED ORDER — ATORVASTATIN CALCIUM 40 MG PO TABS: 40.0000 mg | ORAL_TABLET | Freq: Every day | ORAL | 0 refills | Status: DC

## 2019-08-04 NOTE — Telephone Encounter (Signed)
Rx sent 

## 2019-08-04 NOTE — Telephone Encounter (Signed)
Medication:  atorvastatin (LIPITOR) 40 MG tablet [[id number]]   Has the patient contacted their pharmacy? No. (If no, request that the patient contact the pharmacy for the refill.) (If yes, when and what did the pharmacy advise?)  Preferred Pharmacy (with phone number or street name):  [facility] [address], [place],  [address] Agent: Please be advised that RX refills may take up to 3 business days. We ask that you follow-up with your pharmacy.

## 2019-08-20 ENCOUNTER — Other Ambulatory Visit: Payer: Self-pay

## 2019-08-20 ENCOUNTER — Encounter: Payer: Self-pay | Admitting: Family Medicine

## 2019-08-20 ENCOUNTER — Ambulatory Visit (INDEPENDENT_AMBULATORY_CARE_PROVIDER_SITE_OTHER): Payer: BC Managed Care – PPO | Admitting: Family Medicine

## 2019-08-20 VITALS — BP 128/82 | HR 74 | Temp 98.0°F | Ht 73.0 in | Wt 250.0 lb

## 2019-08-20 DIAGNOSIS — E1169 Type 2 diabetes mellitus with other specified complication: Secondary | ICD-10-CM

## 2019-08-20 DIAGNOSIS — Z0000 Encounter for general adult medical examination without abnormal findings: Secondary | ICD-10-CM

## 2019-08-20 DIAGNOSIS — Z125 Encounter for screening for malignant neoplasm of prostate: Secondary | ICD-10-CM | POA: Diagnosis not present

## 2019-08-20 DIAGNOSIS — E669 Obesity, unspecified: Secondary | ICD-10-CM

## 2019-08-20 DIAGNOSIS — Z136 Encounter for screening for cardiovascular disorders: Secondary | ICD-10-CM | POA: Diagnosis not present

## 2019-08-20 LAB — COMPREHENSIVE METABOLIC PANEL
ALT: 16 U/L (ref 0–53)
AST: 16 U/L (ref 0–37)
Albumin: 4.2 g/dL (ref 3.5–5.2)
Alkaline Phosphatase: 77 U/L (ref 39–117)
BUN: 17 mg/dL (ref 6–23)
CO2: 29 mEq/L (ref 19–32)
Calcium: 9.2 mg/dL (ref 8.4–10.5)
Chloride: 101 mEq/L (ref 96–112)
Creatinine, Ser: 0.92 mg/dL (ref 0.40–1.50)
GFR: 82.37 mL/min (ref >60.00)
Glucose, Bld: 121 mg/dL — ABNORMAL HIGH (ref 70–99)
Potassium: 4.6 mEq/L (ref 3.5–5.1)
Sodium: 140 mEq/L (ref 135–145)
Total Bilirubin: 1.4 mg/dL — ABNORMAL HIGH (ref 0.2–1.2)
Total Protein: 6.5 g/dL (ref 6.0–8.3)

## 2019-08-20 LAB — CBC
HCT: 45.5 % (ref 39.0–52.0)
Hemoglobin: 15.3 g/dL (ref 13.0–17.0)
MCHC: 33.5 g/dL (ref 30.0–36.0)
MCV: 93.5 fl (ref 78.0–100.0)
Platelets: 200 10*3/uL (ref 150.0–400.0)
RBC: 4.86 Mil/uL (ref 4.22–5.81)
RDW: 13.4 % (ref 11.5–15.5)
WBC: 6.1 10*3/uL (ref 4.0–10.5)

## 2019-08-20 LAB — LIPID PANEL
Cholesterol: 107 mg/dL (ref 0–200)
HDL: 41 mg/dL (ref >39.00)
LDL Cholesterol: 52 mg/dL (ref 0–99)
NonHDL: 66.32
Total CHOL/HDL Ratio: 3
Triglycerides: 74 mg/dL (ref 0.0–149.0)
VLDL: 14.8 mg/dL (ref 0.0–40.0)

## 2019-08-20 LAB — MICROALBUMIN / CREATININE URINE RATIO
Creatinine,U: 31.4 mg/dL
Microalb Creat Ratio: 2.2 mg/g (ref 0.0–30.0)
Microalb, Ur: <0.7 mg/dL (ref 0.0–1.9)

## 2019-08-20 LAB — PSA: PSA: 0.83 ng/mL (ref 0.10–4.00)

## 2019-08-20 LAB — HEMOGLOBIN A1C: Hgb A1c MFr Bld: 6.6 % — ABNORMAL HIGH (ref 4.6–6.5)

## 2019-08-20 NOTE — Progress Notes (Signed)
Chief Complaint  Patient presents with  . Annual Exam    [name] is here for a complete physical.   His last physical was >1 year ago.  Current diet: in general, a "healthy" diet.   Current exercise: walking Weight trend: stable Fatigue out of ordinary? No. Seat belt? Yes.    Health maintenance Shingrix- Yes Colonoscopy- Yes Tetanus- Yes Hep C- Yes Pneumonia vaccine- Yes AAA screening- No  Past Medical History:  Diagnosis Date  . Allergy   . BPH (benign prostatic hyperplasia)   . Diabetes mellitus type 2 in obese ([facility])   . Glaucoma   . Hypertension      Past Surgical History:  Procedure Laterality Date  . Broken finger    . TONSILLECTOMY      Medications  Current Outpatient Medications on File Prior to Visit  Medication Sig Dispense Refill  . amLODipine (NORVASC) 5 MG tablet Take 1 tablet by mouth once daily 90 tablet 0  . atorvastatin (LIPITOR) 40 MG tablet Take 1 tablet (40 mg total) by mouth daily. 90 tablet 0  . benzonatate (TESSALON) 100 MG capsule Take 1 capsule (100 mg total) by mouth 3 (three) times daily as needed. 30 capsule 0  . Blood Glucose Monitoring Suppl (ONETOUCH VERIO) w/Device KIT Use once daily to check blood sugar 1 kit 0  . Coenzyme Q10 (COQ10) 100 MG CAPS Take by mouth.    . Cyanocobalamin (VITAMIN B 12 PO) Take 1,000 mg by mouth daily.    . Ergocalciferol (VITAMIN D2) 50 MCG (2000 UT) TABS Take 2,000 mg by mouth daily.    . fexofenadine-pseudoephedrine (ALLEGRA-D 24) 180-240 MG 24 hr tablet Take 1 tablet by mouth daily. 90 tablet 1  . glucose blood (ONETOUCH VERIO) test strip Use as once daily to check blood sugar. 100 each 3  . Krill Oil 1000 MG CAPS Take 1,000 mg by mouth daily.    . Lancets (ONETOUCH ULTRASOFT) lancets Use daily to check blood sugar. 100 each 3  . Magnesium 400 MG CAPS Take 400 mg by mouth daily.    . metFORMIN (GLUCOPHAGE-XR) 500 MG 24 hr tablet Take 2 tablets (1,000 mg total) by mouth daily with  breakfast. 30 tablet 5  . metoprolol tartrate (LOPRESSOR) 50 MG tablet Take 1 tablet by mouth twice daily 180 tablet 0  . ondansetron (ZOFRAN-ODT) 4 MG disintegrating tablet Take 1 tablet (4 mg total) by mouth every 8 (eight) hours as needed for nausea or vomiting. 20 tablet 0  . tamsulosin (FLOMAX) 0.4 MG CAPS capsule Take 1 capsule by mouth once daily 90 capsule 0  . TURMERIC CURCUMIN PO Take 750 capsules by mouth daily.     Allergies No Known Allergies   Family History Family History  Problem Relation Age of Onset  . Cancer Father   . Early death Father   . Diabetes Sister   . Heart disease Sister   . COPD Sister   . Diabetes Sister   . Colon cancer Neg Hx   . Esophageal cancer Neg Hx   . Stomach cancer Neg Hx   . Rectal cancer Neg Hx     Review of Systems: Constitutional:  no fevers Eye:  no recent significant change in vision Ears:  No changes in hearing Nose/Mouth/Throat:  no complaints of nasal congestion, no sore throat Cardiovascular: no chest pain Respiratory:  No shortness of breath Gastrointestinal:  No change in bowel habits GU:  No frequency Integumentary:  no abnormal skin  lesions reported Neurologic:  no headaches Endocrine:  denies unexplained weight changes  Exam BP 128/82 (BP Location: Left Arm, Patient Position: Sitting, Cuff Size: Large)   Pulse 74   Temp 98 F (36.7 C) (Oral)   Ht _0  (1.854 m)   Wt 250 lb (113.4 kg)   SpO2 98%   BMI 32.98 kg/m  General:  well developed, well nourished, in no apparent distress Skin:  no significant moles, warts, or growths Head:  no masses, lesions, or tenderness Eyes:  pupils equal and round, sclera anicteric without injection Ears:  canals without lesions, TMs shiny without retraction, no obvious effusion, no erythema Nose:  nares patent, septum midline, mucosa normal Throat/Pharynx:  lips and gingiva without lesion; tongue and uvula midline; non-inflamed pharynx; no exudates or postnasal drainage Lungs:   clear to auscultation, breath sounds equal bilaterally, no respiratory distress Cardio:  regular rate and rhythm, no LE edema or bruits Rectal: Deferred GI: BS+, S, NT, ND, no masses or organomegaly Musculoskeletal:  symmetrical muscle groups noted without atrophy or deformity Neuro:  gait normal; deep tendon reflexes normal and symmetric Psych: well oriented with normal range of affect and appropriate judgment/insight  Assessment and Plan  Well adult exam - Plan: CBC, Comprehensive metabolic panel, Lipid panel  Diabetes mellitus type 2 in obese ([facility]) - Plan: Microalbumin / creatinine urine ratio, Hemoglobin A1c  Screening for AAA (abdominal aortic aneurysm) - Plan: US AORTA MEDICARE SCREENING  Screening for prostate cancer - Plan: PSA   Well 64 y.o. male. Counseled on diet and exercise. Other orders as above. Counseled on risks and benefits of prostate cancer screening. He agrees to undergo testing.  We had a good talk about the COVID vaccination. He is still hesitant, but appears to be more open to getting it. I offered to answer any future question he may have about it. Follow up in 3-6 mo pending results.   The patient voiced understanding and agreement to the plan.  [place], DO 08/20/19 10:14 AM

## 2019-08-20 NOTE — Patient Instructions (Addendum)
Give [place] 2-3 business days to get the results of your labs back.   Keep the diet clean and stay active.  I recommend getting the COVID vaccine, specifically Coca-Cola or Moderna.   Let us know if you need anything.  Ankle Exercises It is normal to feel mild stretching, pulling, tightness, or discomfort as you do these exercises, but you should stop right away if you feel sudden pain or your pain gets worse.  Stretching and range of motion exercises These exercises warm up your muscles and joints and improve the movement and flexibility of your ankle. These exercises also help to relieve pain, numbness, and tingling. Exercise A: Dorsiflexion/Plantar Flexion   1. Sit with your affected knee straight or bent. Do not rest your foot on anything. 2. Flex your affected ankle to tilt the top of your foot toward your shin. 3. Hold this position for 5 seconds. 4. Point your toes downward to tilt the top of your foot away from your shin. 5. Hold this position for 5 seconds. Repeat 2 times. Complete this exercise 3 times per week. Exercise B: Ankle Alphabet   1. Sit with your affected foot supported at your lower leg. ? Do not rest your foot on anything. ? Make sure your foot has room to move freely. 2. Think of your affected foot as a paintbrush, and move your foot to trace each letter of the alphabet in the air. Keep your hip and knee still while you trace. Make the letters as large as you can without increasing any discomfort. 3. Trace every letter from A to Z. Repeat 2 times. Complete this exercise 3 times per week. Strengthening exercises These exercises build strength and endurance in your ankle. Endurance is the ability to use your muscles for a long time, even after they get tired. Exercise D: Dorsiflexors   1. Secure a rubber exercise band or tube to an object, such as a table leg, that will stay still when the band is pulled. Secure the other end around your affected foot. 2. Sit on  the floor, facing the object with your affected leg extended. The band or tube should be slightly tense when your foot is relaxed. 3. Slowly flex your affected ankle and toes to bring your foot toward you. 4. Hold this position for 3 seconds.  5. Slowly return your foot to the starting position, controlling the band as you do that. Do a total of 10 repetitions. Repeat 2 times. Complete this exercise 3 times per week. Exercise E: Plantar Flexors   1. Sit on the floor with your affected leg extended. 2. Loop a rubber exercise band or tube around the ball of your affected foot. The ball of your foot is on the walking surface, right under your toes. The band or tube should be slightly tense when your foot is relaxed. 3. Slowly point your toes downward, pushing them away from you. 4. Hold this position for 3 seconds. 5. Slowly release the tension in the band or tube, controlling smoothly until your foot is back in the starting position. Repeat for a total of 10 repetitions. Repeat 2 times. Complete this exercise 3 times per week. Exercise F: Towel Curls   1. Sit in a chair on a non-carpeted surface, and put your feet on the floor. 2. Place a towel in front of your feet.  3. Keeping your heel on the floor, put your affected foot on the towel. 4. Pull the towel toward you by grabbing  the towel with your toes and curling them under. Keep your heel on the floor. 5. Let your toes relax. 6. Grab the towel again. Keep going until the towel is completely underneath your foot. Repeat for a total of 10 repetitions. Repeat 2 times. Complete this exercise 3 times per week. Exercise G: Heel Raise ( Plantar Flexors, Standing)    1. Stand with your feet shoulder-width apart. 2. Keep your weight spread evenly over the width of your feet while you rise up on your toes. Use a wall or table to steady yourself, but try not to use it for support. 3. If this exercise is too easy, try these options: ? Shift  your weight toward your affected leg until you feel challenged. ? If told by your health care provider, lift your uninjured leg off the floor. 4. Hold this position for 3 seconds. Repeat for a total of 10 repetitions. Repeat 2 times. Complete this exercise 3 times per week. Exercise H: Tandem Walking 1. Stand with one foot directly in front of the other. 2. Slowly raise your back foot up, lifting your heel before your toes, and place it directly in front of your other foot. 3. Continue to walk in this heel-to-toe way for 10 steps or for as long as told by your health care provider. Have a countertop or wall nearby to use if needed to keep your balance, but try not to hold onto anything for support. Repeat 2 times. Complete this exercises 3 times per week. Make sure you discuss any questions you have with your health care provider. Document Released: 11/09/2004 Document Revised: 08/26/2015 Document Reviewed: 09/13/2014 Elsevier Interactive Patient Education  2018 [employer].

## 2019-08-22 ENCOUNTER — Other Ambulatory Visit: Payer: Self-pay | Admitting: Family Medicine

## 2019-08-22 DIAGNOSIS — Z136 Encounter for screening for cardiovascular disorders: Secondary | ICD-10-CM

## 2019-08-22 NOTE — Progress Notes (Signed)
US

## 2019-09-07 ENCOUNTER — Other Ambulatory Visit: Payer: Self-pay

## 2019-09-08 MED ORDER — METFORMIN HCL ER 500 MG PO TB24: 1000.0000 mg | ORAL_TABLET | Freq: Every day | ORAL | 5 refills | Status: DC

## 2019-09-08 MED ORDER — AMLODIPINE BESYLATE 5 MG PO TABS: 5.0000 mg | ORAL_TABLET | Freq: Every day | ORAL | 1 refills | Status: DC

## 2019-09-09 ENCOUNTER — Other Ambulatory Visit: Payer: Self-pay | Admitting: Family Medicine

## 2019-09-10 ENCOUNTER — Ambulatory Visit (HOSPITAL_BASED_OUTPATIENT_CLINIC_OR_DEPARTMENT_OTHER): Payer: BC Managed Care – PPO

## 2019-09-12 ENCOUNTER — Other Ambulatory Visit: Payer: Self-pay | Admitting: Family Medicine

## 2019-09-12 DIAGNOSIS — E1169 Type 2 diabetes mellitus with other specified complication: Secondary | ICD-10-CM

## 2019-10-07 ENCOUNTER — Other Ambulatory Visit: Payer: Self-pay | Admitting: Family Medicine

## 2019-10-14 MED ORDER — TAMSULOSIN HCL 0.4 MG PO CAPS: 0.4000 mg | ORAL_CAPSULE | Freq: Every day | ORAL | 3 refills | Status: DC

## 2019-11-07 DIAGNOSIS — E1169 Type 2 diabetes mellitus with other specified complication: Secondary | ICD-10-CM

## 2019-11-07 MED ORDER — METOPROLOL TARTRATE 50 MG PO TABS: 50.0000 mg | ORAL_TABLET | Freq: Two times a day (BID) | ORAL | 3 refills | Status: DC

## 2019-11-07 MED ORDER — ATORVASTATIN CALCIUM 40 MG PO TABS: 40.0000 mg | ORAL_TABLET | Freq: Every day | ORAL | 3 refills | Status: DC

## 2019-11-20 ENCOUNTER — Ambulatory Visit: Payer: BC Managed Care – PPO | Admitting: Orthopaedic Surgery

## 2019-12-10 ENCOUNTER — Ambulatory Visit: Payer: Self-pay

## 2019-12-10 ENCOUNTER — Ambulatory Visit: Payer: BC Managed Care – PPO | Admitting: Orthopaedic Surgery

## 2019-12-10 ENCOUNTER — Encounter: Payer: Self-pay | Admitting: Orthopaedic Surgery

## 2019-12-10 DIAGNOSIS — M25551 Pain in right hip: Secondary | ICD-10-CM

## 2019-12-10 DIAGNOSIS — M1711 Unilateral primary osteoarthritis, right knee: Secondary | ICD-10-CM | POA: Diagnosis not present

## 2019-12-10 MED ORDER — BUPIVACAINE HCL 0.25 % IJ SOLN
2.0000 mL | INTRAMUSCULAR | Status: AC | PRN
  Administered 2019-12-10: 2 mL via INTRA_ARTICULAR

## 2019-12-10 MED ORDER — METHYLPREDNISOLONE ACETATE 40 MG/ML IJ SUSP
40.0000 mg | INTRAMUSCULAR | Status: AC | PRN
  Administered 2019-12-10: 40 mg via INTRA_ARTICULAR

## 2019-12-10 MED ORDER — LIDOCAINE HCL 1 % IJ SOLN
2.0000 mL | INTRAMUSCULAR | Status: AC | PRN
  Administered 2019-12-10: 2 mL

## 2019-12-10 MED ORDER — MELOXICAM 7.5 MG PO TABS: 7.5000 mg | ORAL_TABLET | Freq: Two times a day (BID) | ORAL | 2 refills | Status: DC | PRN

## 2019-12-10 NOTE — Progress Notes (Signed)
Office Visit Note   Patient: Jon Townsend           Date of Birth: 06-19-1955           MRN: [id number] Visit Date: 12/10/2019              Requested by: [name], [address] [place],  [place] [address] PCP: [name], DO   Assessment & Plan: Visit Diagnoses:  1. Primary osteoarthritis of right knee   2. Pain in right hip     Plan: Impression is right knee osteoarthritis and mild trochanteric bursitis to the right hip.  I believe the patient has been walking with an altered gait due to his knee pain which is caused bursitis and possible referred pain from the back.  We discussed cortisone injection to the right knee today for which she would like to proceed.  We have also called in anti-inflammatories and provided him with a handout for Voltaren gel.  I have also provided him with a iliotibial band exercise program.  He will follow up with [place] as needed.  Follow-Up Instructions: Return if symptoms worsen or fail to improve.   Orders:  Orders Placed This Encounter  Procedures  . Large Joint Inj: R knee  . XR HIP UNILAT W OR W/O PELVIS 2-3 VIEWS RIGHT  . XR KNEE 3 VIEW RIGHT   Meds ordered this encounter  Medications  . meloxicam (MOBIC) 7.5 MG tablet    Sig: Take 1 tablet (7.5 mg total) by mouth 2 (two) times daily as needed for pain.    Dispense:  30 tablet    Refill:  2      Procedures: Large Joint Inj: R knee on 12/10/2019 9:57 AM Indications: pain Details: 22 G needle, anterolateral approach Medications: 2 mL lidocaine 1 %; 2 mL bupivacaine 0.25 %; 40 mg methylPREDNISolone acetate 40 MG/ML      Clinical Data: No additional findings.   Subjective: Chief Complaint  Patient presents with  . Right Hip - Pain  . Right Knee - Pain    HPI patient is a very pleasant 64 year old gentleman who comes in today with right hip and right knee pain.  In regards to his right hip pain is on only to the groin but the  lateral hip and occasionally into the buttocks.  He has had this intermittently for over a year but has progressively worsened over the past 6 months to 1 year.  No known injury or change in activity.  He denies any weakness to the right lower extremity.  Pain he has is primarily with external rotation of the hip is when he is putting on his shoes.  No numbness, tingling or burning to the right lower extremity.  He has not previously had an epidural steroid injection or surgical intervention to his back or hip.  He has not been to physical therapy but he notes he has been seeing a chiropractor for this.  In regards to his knee, the pain is all to the medial aspect.  He describes this as an ache with a feeling of heaviness at times.  He has been using ice, Salonpas and taking Tylenol without significant relief of symptoms.  No previous cortisone injection to the right knee.  Review of Systems as detailed in HPI.  All others reviewed and are negative.   Objective: Vital Signs: There were no vitals taken for this visit.  Physical Exam well-developed well-nourished gentleman  in no acute distress.  Alert oriented x3.  Ortho Exam right hip exam shows negative logroll negative FADIR.  He has mild tenderness to the greater trochanter.  Negative straight leg raise.  Right knee exam shows no effusion.  Range of motion 0 to 120 degrees.  No joint line tenderness.  Ligaments are stable.  He is neurovascular intact distally.  Specialty Comments:  No specialty comments available.  Imaging: XR HIP UNILAT W OR W/O PELVIS 2-3 VIEWS RIGHT  Result Date: 12/10/2019 X-rays reveal well-preserved joint space  XR KNEE 3 VIEW RIGHT  Result Date: 12/10/2019 Moderate degenerative changes the medial patellofemoral compartments    PMFS History: Patient Active Problem List   Diagnosis Date Noted  . Diabetes mellitus type 2 in obese ([place])   . BPH (benign prostatic hyperplasia)    Past Medical History:  Diagnosis  Date  . Allergy   . BPH (benign prostatic hyperplasia)   . Diabetes mellitus type 2 in obese ([place])   . Glaucoma   . Hypertension     Family History  Problem Relation Age of Onset  . Cancer Father   . Early death Father   . Diabetes Sister   . Heart disease Sister   . COPD Sister   . Diabetes Sister   . Colon cancer Neg Hx   . Esophageal cancer Neg Hx   . Stomach cancer Neg Hx   . Rectal cancer Neg Hx     Past Surgical History:  Procedure Laterality Date  . Broken finger    . TONSILLECTOMY     Social History   Occupational History  . Not on file  Tobacco Use  . Smoking status: Former Research scientist (life sciences)  . Smokeless tobacco: Never Used  . Tobacco comment: Quit 35 years ago  Substance and Sexual Activity  . Alcohol use: Never  . Drug use: Never  . Sexual activity: Not on file

## 2019-12-16 ENCOUNTER — Telehealth (INDEPENDENT_AMBULATORY_CARE_PROVIDER_SITE_OTHER): Payer: BC Managed Care – PPO | Admitting: Family Medicine

## 2019-12-16 ENCOUNTER — Other Ambulatory Visit: Payer: Self-pay

## 2019-12-16 ENCOUNTER — Encounter: Payer: Self-pay | Admitting: Family Medicine

## 2019-12-16 DIAGNOSIS — J209 Acute bronchitis, unspecified: Secondary | ICD-10-CM | POA: Diagnosis not present

## 2019-12-16 MED ORDER — PREDNISONE 20 MG PO TABS: 40.0000 mg | ORAL_TABLET | Freq: Every day | ORAL | 0 refills | Status: AC

## 2019-12-16 NOTE — Progress Notes (Signed)
Chief Complaint  Patient presents with  . Cough    drainage    [name] here for URI complaints. Due to COVID-19 pandemic, we are interacting via web portal for an electronic face-to-face visit. I verified patient's ID using 2 identifiers. Patient agreed to proceed with visit via this method. Patient is at home, I am at office. Patient and I are present for visit.   Duration: 1 week  Associated symptoms: sinus congestion, rhinorrhea, itchy watery eyes, chest tightness and cough Denies: sinus pain, ear pain, ear drainage, sore throat, wheezing, shortness of breath, myalgia and fevers, N/V/D Treatment to date: OTC cold/flu meds, Chlortab Sick contacts: Yes- grandkids  Past Medical History:  Diagnosis Date  . Allergy   . BPH (benign prostatic hyperplasia)   . Diabetes mellitus type 2 in obese ([place])   . Glaucoma   . Hypertension    Exam No conversational dyspnea Age appropriate judgment and insight Nml affect and mood  Acute bronchitis, unspecified organism - Plan: predniSONE (DELTASONE) 20 MG tablet  5 d pred burst, 40 mg/d. Send message in 2-3 d, Zpak as contingency.  Continue to push fluids, practice good hand hygiene, cover mouth when coughing. F/u prn. If starting to experience fevers, shaking, or shortness of breath, seek immediate care. Pt voiced understanding and agreement to the plan.  [place], DO 12/16/19 3:41 PM

## 2020-01-28 MED ORDER — PREDNISONE 20 MG PO TABS: 40.0000 mg | ORAL_TABLET | Freq: Every day | ORAL | 0 refills | Status: AC

## 2020-01-28 NOTE — Telephone Encounter (Signed)
Patient would like to speak to you.

## 2020-02-02 ENCOUNTER — Emergency Department (HOSPITAL_COMMUNITY): Payer: Medicare Other

## 2020-02-02 ENCOUNTER — Other Ambulatory Visit: Payer: Self-pay

## 2020-02-02 ENCOUNTER — Encounter (HOSPITAL_COMMUNITY): Payer: Self-pay | Admitting: *Deleted

## 2020-02-02 ENCOUNTER — Emergency Department (EMERGENCY_DEPARTMENT_HOSPITAL)
Admission: EM | Admit: 2020-02-02 | Discharge: 2020-02-02 | Disposition: A | Discharge status: Home / Self Care | Payer: Medicare Other | Source: Home / Self Care | Attending: Emergency Medicine | Admitting: Emergency Medicine

## 2020-02-02 DIAGNOSIS — Z7984 Long term (current) use of oral hypoglycemic drugs: Secondary | ICD-10-CM | POA: Insufficient documentation

## 2020-02-02 DIAGNOSIS — E119 Type 2 diabetes mellitus without complications: Secondary | ICD-10-CM | POA: Insufficient documentation

## 2020-02-02 DIAGNOSIS — Z87891 Personal history of nicotine dependence: Secondary | ICD-10-CM | POA: Insufficient documentation

## 2020-02-02 DIAGNOSIS — R202 Paresthesia of skin: Secondary | ICD-10-CM | POA: Insufficient documentation

## 2020-02-02 DIAGNOSIS — Z79899 Other long term (current) drug therapy: Secondary | ICD-10-CM | POA: Insufficient documentation

## 2020-02-02 DIAGNOSIS — R519 Headache, unspecified: Secondary | ICD-10-CM

## 2020-02-02 DIAGNOSIS — U071 COVID-19: Secondary | ICD-10-CM | POA: Insufficient documentation

## 2020-02-02 DIAGNOSIS — R200 Anesthesia of skin: Secondary | ICD-10-CM | POA: Diagnosis not present

## 2020-02-02 DIAGNOSIS — R531 Weakness: Secondary | ICD-10-CM | POA: Diagnosis not present

## 2020-02-02 DIAGNOSIS — I10 Essential (primary) hypertension: Secondary | ICD-10-CM | POA: Insufficient documentation

## 2020-02-02 DIAGNOSIS — G610 Guillain-Barre syndrome: Secondary | ICD-10-CM | POA: Diagnosis not present

## 2020-02-02 LAB — CBC WITH DIFFERENTIAL/PLATELET
Abs Immature Granulocytes: 0.06 10*3/uL (ref 0.00–0.07)
Basophils Absolute: 0 10*3/uL (ref 0.0–0.1)
Basophils Relative: 0 %
Eosinophils Absolute: 0 10*3/uL (ref 0.0–0.5)
Eosinophils Relative: 0 %
HCT: 47.4 % (ref 39.0–52.0)
Hemoglobin: 16.3 g/dL (ref 13.0–17.0)
Immature Granulocytes: 1 %
Lymphocytes Relative: 15 %
Lymphs Abs: 1.6 10*3/uL (ref 0.7–4.0)
MCH: 31.4 pg (ref 26.0–34.0)
MCHC: 34.4 g/dL (ref 30.0–36.0)
MCV: 91.3 fL (ref 80.0–100.0)
Monocytes Absolute: 0.9 10*3/uL (ref 0.1–1.0)
Monocytes Relative: 8 %
Neutro Abs: 7.9 10*3/uL — ABNORMAL HIGH (ref 1.7–7.7)
Neutrophils Relative %: 76 %
Platelets: 200 10*3/uL (ref 150–400)
RBC: 5.19 MIL/uL (ref 4.22–5.81)
RDW: 12.3 % (ref 11.5–15.5)
WBC: 10.4 10*3/uL (ref 4.0–10.5)
nRBC: 0 % (ref 0.0–0.2)

## 2020-02-02 LAB — URINALYSIS, ROUTINE W REFLEX MICROSCOPIC
Bilirubin Urine: NEGATIVE
Glucose, UA: 50 mg/dL — AB
Hgb urine dipstick: NEGATIVE
Ketones, ur: NEGATIVE mg/dL
Leukocytes,Ua: NEGATIVE
Nitrite: NEGATIVE
Protein, ur: NEGATIVE mg/dL
Specific Gravity, Urine: 1.008 (ref 1.005–1.030)
pH: 7 (ref 5.0–8.0)

## 2020-02-02 LAB — COMPREHENSIVE METABOLIC PANEL
ALT: 19 U/L (ref 0–44)
AST: 15 U/L (ref 15–41)
Albumin: 3.5 g/dL (ref 3.5–5.0)
Alkaline Phosphatase: 77 U/L (ref 38–126)
Anion gap: 11 (ref 5–15)
BUN: 16 mg/dL (ref 8–23)
CO2: 27 mmol/L (ref 22–32)
Calcium: 9 mg/dL (ref 8.9–10.3)
Chloride: 101 mmol/L (ref 98–111)
Creatinine, Ser: 0.95 mg/dL (ref 0.61–1.24)
GFR, Estimated: >60 mL/min (ref >60)
Glucose, Bld: 152 mg/dL — ABNORMAL HIGH (ref 70–99)
Potassium: 3.3 mmol/L — ABNORMAL LOW (ref 3.5–5.1)
Sodium: 139 mmol/L (ref 135–145)
Total Bilirubin: 1.5 mg/dL — ABNORMAL HIGH (ref 0.3–1.2)
Total Protein: 6.6 g/dL (ref 6.5–8.1)

## 2020-02-02 LAB — POC SARS CORONAVIRUS 2 AG -  ED: SARS Coronavirus 2 Ag: POSITIVE — AB

## 2020-02-02 LAB — CK: Total CK: 96 U/L (ref 49–397)

## 2020-02-02 LAB — VITAMIN B12: Vitamin B-12: 1081 pg/mL — ABNORMAL HIGH (ref 180–914)

## 2020-02-02 MED ORDER — GADOBUTROL 1 MMOL/ML IV SOLN
10.0000 mL | Freq: Once | INTRAVENOUS | Status: AC | PRN
  Administered 2020-02-02: 10 mL via INTRAVENOUS

## 2020-02-02 MED ORDER — ACETAMINOPHEN 500 MG PO TABS
1000.0000 mg | ORAL_TABLET | Freq: Once | ORAL | Status: AC
  Administered 2020-02-02: 1000 mg via ORAL
  Filled 2020-02-02: qty 2

## 2020-02-02 MED ORDER — POTASSIUM CHLORIDE CRYS ER 20 MEQ PO TBCR
40.0000 meq | EXTENDED_RELEASE_TABLET | Freq: Once | ORAL | Status: AC
  Administered 2020-02-02: 40 meq via ORAL
  Filled 2020-02-02: qty 2

## 2020-02-02 NOTE — ED Provider Notes (Signed)
I provided a substantive portion of the care of this patient.  I personally performed the entirety of the history for this encounter.  EKG Interpretation  Date/Time:  Monday February 02 2020 07:45:46 EST Ventricular Rate:  78 PR Interval:    QRS Duration: 93 QT Interval:  364 QTC Calculation: 415 R Axis:   10 Text Interpretation: Sinus rhythm Sinus pause Abnormal R-wave progression, early transition LVH by voltage Borderline T abnormalities, inferior leads No old tracing to compare Confirmed by Addison Lank [phone]) on 02/03/2020 10:23:41 AM  Patient symptoms for start with headache and nausea.  He then started to develop some numbness and tingling sensation in hands and feet.  Subsequent to this severe general weakness has developed.  Patient has had 2 falls.  This is very atypical.  Patient at baseline is very active with normal mental status.  He reports it just feels like his legs are so weak and heavy that when he is in a certain position he loses balance and falls over.  Patient is alert with clear mental status.  Normal cognitive function.  Finger-nose exam patient exhibits some incoordination bilaterally.  He can elevate each lower extremity off the bed independently but fatigues and them to fall back to the bed sooner than anticipated given baseline normal strength.  Patient has tested positive for Covid.  Degree of weakness and neurologic symptoms of paresthesias atypical for uncomplicated Covid course.  Symptoms are diffuse and less consistent with stroke presentation.  Consideration given to Guillain-Barr, transverse myelitis, or other Covid complications such as venous sinus thrombosis.  Neurology consulted for further diagnostic and management planning.  Patient's respiratory status and airway remained stable.   Charlesetta Shanks, MD 02/04/20 [phone]

## 2020-02-02 NOTE — ED Triage Notes (Addendum)
To ED via [facility] for eval of freq falls over the past few days, feeling unsteady, and HA. Pupils equal. No neuro deficits noted. Speech clear. Complains of tingling to hands and feet. Pt states he doesn't feel steady but not dizzy.

## 2020-02-02 NOTE — ED Notes (Signed)
Patient placed in hospital bed pillow given

## 2020-02-02 NOTE — Consult Note (Addendum)
Neurology Consultation  Reason for Consult: Weakness and paraesthesia in B/l Upper and Lower Extrimities Referring Physician: Dr. Charlesetta Shanks MD  CC: Weakness and paraesthesia in B/l Upper and Lower Extrimities  History is obtained from: Pt and EMR  HPI: Jon Townsend is a 65 y.o. male with Past medical history of Type 2 DM, HTN, BPH, Glaucoma presented to ED with weakness and paraesthesia in B/l Upper and Lower Extremities x1-2 day. He states since Saturday night/sunday morning he has been been feeling unsteady with pins and needles feeling in all of his limbs.  This started as paresthesias just in his fingertips, but then moved to involve his hands and his feet.  He states he is able to move his extremities but feel weaker than before. (Rt>Lt). Pt states he had sinus infection in December (12/16/2019) and received prednisone 40 mg for 5 days and then got better for about 2 weeks.  He then again had cough and bronchitis for which he got Prednisone for 5 more days, which he completed on 1/23 (yesterday); due to insurance issues he was not seen in the office at that time.  He states on Friday he developed head ache, cough and nausea for which he took over the counter pain and nausea medication and his headache got better on Saturday. Pt denies any fever, dizziness, vomiting , abdominal pain and diarrhea. He states he normally have case of bronchitis this time of the year. Pt was found positive for COVID this morning.    LKW: Friday  tpa given?: no,  Premorbid modified Rankin scale (mRS):  0-Completely asymptomatic   ROS: A 14 point ROS was performed and is negative except as noted in the HPI.  Past Medical History:  Diagnosis Date  . Allergy   . BPH (benign prostatic hyperplasia)   . Diabetes mellitus type 2 in obese ([place])   . Glaucoma   . Hypertension     0-Completely asymptomatic and back to baseline post- stroke Essential (primary) hypertension, Type 2 diabetes mellitus w/o  complications and Obesity   Family History  Problem Relation Age of Onset  . Cancer Father   . Early death Father   . Diabetes Sister   . Heart disease Sister   . COPD Sister   . Diabetes Sister   . Colon cancer Neg Hx   . Esophageal cancer Neg Hx   . Stomach cancer Neg Hx   . Rectal cancer Neg Hx      Social History:   reports that he has quit smoking. He has never used smokeless tobacco. He reports that he does not drink alcohol and does not use drugs.  Medications  Current Facility-Administered Medications:  .  potassium chloride SA (KLOR-CON) CR tablet 40 mEq, 40 mEq, Oral, Once, Jacqlyn Larsen, PA-C  Current Outpatient Medications:  .  amLODipine (NORVASC) 5 MG tablet, Take 1 tablet (5 mg total) by mouth daily., Disp: 90 tablet, Rfl: 1 .  atorvastatin (LIPITOR) 40 MG tablet, Take 1 tablet (40 mg total) by mouth daily., Disp: 90 tablet, Rfl: 3 .  Blood Glucose Monitoring Suppl (ONETOUCH VERIO) w/Device KIT, Use once daily to check blood sugar, Disp: 1 kit, Rfl: 0 .  Coenzyme Q10 (COQ10) 100 MG CAPS, Take by mouth., Disp: , Rfl:  .  Ergocalciferol (VITAMIN D2) 50 MCG (2000 UT) TABS, Take 2,000 mg by mouth daily., Disp: , Rfl:  .  glucose blood (ONETOUCH VERIO) test strip, Use as once daily to check blood sugar., Disp:  100 each, Rfl: 3 .  Krill Oil 1000 MG CAPS, Take 1,000 mg by mouth daily., Disp: , Rfl:  .  Lancets (ONETOUCH ULTRASOFT) lancets, Use daily to check blood sugar., Disp: 100 each, Rfl: 3 .  meloxicam (MOBIC) 7.5 MG tablet, Take 1 tablet (7.5 mg total) by mouth 2 (two) times daily as needed for pain., Disp: 30 tablet, Rfl: 2 .  metFORMIN (GLUCOPHAGE-XR) 500 MG 24 hr tablet, Take 2 tablets (1,000 mg total) by mouth daily with breakfast., Disp: 60 tablet, Rfl: 5 .  metoprolol tartrate (LOPRESSOR) 50 MG tablet, Take 1 tablet (50 mg total) by mouth 2 (two) times daily., Disp: 180 tablet, Rfl: 3 .  predniSONE (DELTASONE) 20 MG tablet, Take 2 tablets (40 mg total) by  mouth daily with breakfast for 5 days., Disp: 10 tablet, Rfl: 0 .  tamsulosin (FLOMAX) 0.4 MG CAPS capsule, Take 1 capsule (0.4 mg total) by mouth daily., Disp: 90 capsule, Rfl: 3 .  TURMERIC CURCUMIN PO, Take 750 capsules by mouth daily., Disp: , Rfl:   Exam: Current vital signs: BP (!) 137/93   Pulse 84   Temp 98.6 F (37 C) (Oral)   Resp 18   SpO2 97%  Vital signs in last 24 hours: Temp:  [98.6 F (37 C)-98.9 F (37.2 C)] 98.6 F (37 C) (01/24 0757) Pulse Rate:  [80-84] 84 (01/24 1100) Resp:  [16-18] 18 (01/24 1100) BP: (137-158)/(91-93) 137/93 (01/24 1100) SpO2:  [97 %] 97 % (01/24 1100)  GENERAL: Awake, alert in NAD HEENT: - Normocephalic and atraumatic, dry mucous membranes LUNGS - symmetrical chest rise, no labored breathing CV - No JVD, RRR, No peripheral Edema  ABDOMEN - Soft,  nondistended  Ext: warm, well perfused,  NEURO:  Mental Status: AA&O to person place time and situation, Attention and Concentration intact Language: speech is normal;.  Pt is able to name simple objects, repetition intact,  fluency, and comprehension intact Cranial Nerves: PERRL, EOMI, visual fields full, subtle right facial droop, facial sensation intact, hearing intact, tongue/uvula/soft palate midline, normal sternocleidomastoid and trapezius muscle strength. No evidence of tongue atrophy or fibrillations Motor: symmetric mild drift without pronation of the bilateral upper extremities.    Right/left Deltoids 5/5 Biceps 4 -/4 Triceps 4+/4+ Wrist extension 4/4+ Wrist flexion 5/5 Finger extension 4 -/4 - Finger flexion 5/5 Hip flexion 4/4 Knee flexion 5/5 Knee extension 5/5 Ankle dorsiflexion 4-/4 Plantar flexion 5/5  Reflexes-biceps 2+ and symmetric, patellar and brachioradialis difficult to elicit secondary to patient not relaxing, and Achilles 2+ and symmetric, [place]- Down going bilaterally  Tone: is normal  Sensation-reduced sensation in all 4 extremities to temperature.   Length dependent loss of pinprick but not temperature in all 4 extremities.  Impaired vibration more in the left than the right (absent at the great toe on the left, 5 to 6 seconds on the right), mildly impaired proprioception in the bilateral great toes Coordination: FTN intact bilaterally, no ataxia in BLE. Gait- deferred, patient sat at the side of the bed but reported he felt too weak to attempt to stand, though he had ambulated to the wheelchair for x-ray earlier   Labs I have reviewed labs in epic and the results pertinent to this consultation are: Hypokalemia to 3.3 Creatinine 0.95 SARS-CoV-2 positive  CBC    Component Value Date/Time   WBC 10.4 02/02/2020 0800   RBC 5.19 02/02/2020 0800   HGB 16.3 02/02/2020 0800   HCT 47.4 02/02/2020 0800   PLT 200 02/02/2020 0800  MCV 91.3 02/02/2020 0800   MCH 31.4 02/02/2020 0800   MCHC 34.4 02/02/2020 0800   RDW 12.3 02/02/2020 0800   LYMPHSABS 1.6 02/02/2020 0800   MONOABS 0.9 02/02/2020 0800   EOSABS 0.0 02/02/2020 0800   BASOSABS 0.0 02/02/2020 0800    CMP     Component Value Date/Time   NA 139 02/02/2020 0800   K 3.3 (L) 02/02/2020 0800   CL 101 02/02/2020 0800   CO2 27 02/02/2020 0800   GLUCOSE 152 (H) 02/02/2020 0800   BUN 16 02/02/2020 0800   CREATININE 0.95 02/02/2020 0800   CALCIUM 9.0 02/02/2020 0800   PROT 6.6 02/02/2020 0800   ALBUMIN 3.5 02/02/2020 0800   AST 15 02/02/2020 0800   ALT 19 02/02/2020 0800   ALKPHOS 77 02/02/2020 0800   BILITOT 1.5 (H) 02/02/2020 0800   GFRNONAA >60 02/02/2020 0800    Lipid Panel     Component Value Date/Time   CHOL 107 08/20/2019 1023   TRIG 74.0 08/20/2019 1023   HDL 41.00 08/20/2019 1023   CHOLHDL 3 08/20/2019 1023   VLDL 14.8 08/20/2019 1023   LDLCALC 52 08/20/2019 1023     Imaging I have reviewed the images obtained:  CT-scan of the brain No evidence of acute intracranial abnormality. Mild paranasal sinus disease as described.   Assessment: This is a  65 year old gentleman with vascular risk factors of hypertension, hyperlipidemia, diabetes and prior tobacco use who presents with COVID-19 infection and an atypical pattern of weakness.  He does not have typical extensor greater than flexor weakness in the upper extremities that would be expected with an upper motor neuron lesion.  In the upper extremities he is weaker distally and in the lower extremities he is weaker proximally.  His sensory examination is also somewhat atypical with vibration impaired more than proprioception and temperature impaired more than pinprick.  This is in the setting of COVID-19 infection as well as recent steroid use.  Steroid use can be associated with a myopathy, so we will check a CK.  We will additionally check a B12 and copper/zinc levels to assess for nutritional causes of myelopathy.  GBS was considered but seems very unlikely given intact ankle jerk reflexes.  This would also be atypical for a process such as transverse myelitis given the pattern of weakness, but will confirm there is no significant CNS pathology with imaging.  Recommendations: -MRI brain, C-spine, T-spine with and without contrast -B12, MMA, copper, zinc, CK -PT/OT evaluation -Treatment of COVID-19 infection per primary team  Dr. Armando Reichert MD PGY1 [facility] Neurology  Attending Neurologist's note:  I personally saw this patient, gathering history, performing a full neurologic examination, reviewing relevant labs, personally reviewing relevant imaging including MRI brain, C-spine, and thoracic spine, and formulated the assessment and plan, adding the note above for completeness and clarity to accurately reflect my thoughts  Work-up at this point have been resulted reassuring including normal (actually supratherapeutic) B12, normal CK, normal imaging.  Again, given intact reflexes on my evaluation without any significant upper motor neuron lesion, GBS is highly unlikely.  Suspect unmasking  of pre-existing neuropathy in the setting of acute infection.  PT/OT evaluation and supportive treatment of COVID-19 infection per primary team.  Outpatient EMG/nerve conduction study, referral to outpatient neurology placed

## 2020-02-02 NOTE — ED Provider Notes (Signed)
[facility] EMERGENCY DEPARTMENT Provider Note   CSN: [id number] Arrival date & time: 02/02/20  8850     History Chief Complaint  Patient presents with  . Fall  . Headache         Jon Townsend is a 65 y.o. male.  Jon Townsend is a 65 y.o. male with a history of hypertension, diabetes, BPH, glaucoma, allergies, who presents to the ED via [facility] for evaluation of weakness, falls, headache and nausea.  Patient reports he first started having symptoms on Friday.  He reports he started with a headache, Saturday he noticed headache seemed to ease off a bit but he started to have some tingling and paresthesias in his hands and feet.  He reports he then started to notice that his left legs were feeling more weak.  He reports the leg weakness has gotten progressively worse and caused him to fall a few times, twice onto his knees and once falling backwards, but he denies injury from the falls.  Reports Sunday headache returned and he has been feeling unwell with some nausea and dry heaving but no vomiting.  He denies any associated abdominal pain, normal bowel movements.  Denies any chest pain or shortness of breath.  Has had a cough recently and was treated by his primary care provider for bronchitis, just completed a course of steroids which he thinks helped some but not completely.  Denies fevers.  Denies body aches.  No pain in his legs associated with weakness, no back pain.  No loss of bowel or bladder control.  No paresthesias elsewhere.  No changes in speech, or visual changes.  No history of neurologic issues.  Unsure if he has had any sick contacts, family members were unable to find at home Covid test to do, has not been vaccinated for Covid, but had Covid infection 1 year ago.        Past Medical History:  Diagnosis Date  . Allergy   . BPH (benign prostatic hyperplasia)   . Diabetes mellitus type 2 in obese ([place])   . Glaucoma   . Hypertension      Patient Active Problem List   Diagnosis Date Noted  . Diabetes mellitus type 2 in obese ([place])   . BPH (benign prostatic hyperplasia)     Past Surgical History:  Procedure Laterality Date  . Broken finger    . TONSILLECTOMY         Family History  Problem Relation Age of Onset  . Cancer Father   . Early death Father   . Diabetes Sister   . Heart disease Sister   . COPD Sister   . Diabetes Sister   . Colon cancer Neg Hx   . Esophageal cancer Neg Hx   . Stomach cancer Neg Hx   . Rectal cancer Neg Hx     Social History   Tobacco Use  . Smoking status: Former Research scientist (life sciences)  . Smokeless tobacco: Never Used  . Tobacco comment: Quit 35 years ago  Substance Use Topics  . Alcohol use: Never  . Drug use: Never    Home Medications Prior to Admission medications   Medication Sig Start Date End Date Taking? Authorizing Provider  amLODipine (NORVASC) 5 MG tablet Take 1 tablet (5 mg total) by mouth daily. 09/08/19   [name], DO  atorvastatin (LIPITOR) 40 MG tablet Take 1 tablet (40 mg total) by mouth daily. 11/07/19   [name], DO  Blood Glucose  Monitoring [facility]l [facility] VERIO) w/Device KIT Use once daily to check blood sugar 01/23/18   Wendling, Crosby Oyster, DO  Coenzyme Q10 (COQ10) 100 MG CAPS Take by mouth.    [provider]  Ergocalciferol (VITAMIN D2) 50 MCG (2000 UT) TABS Take 2,000 mg by mouth daily.    [provider]  glucose blood (ONETOUCH VERIO) test strip Use as once daily to check blood sugar. 01/23/18   Wendling, Crosby Oyster, DO  Krill Oil 1000 MG CAPS Take 1,000 mg by mouth daily.    [provider]  Lancets [facility] ULTRASOFT) lancets Use daily to check blood sugar. 01/23/18   [name], DO  meloxicam (MOBIC) 7.5 MG tablet Take 1 tablet (7.5 mg total) by mouth 2 (two) times daily as needed for pain. 12/10/19   Leandrew Koyanagi, MD  metFORMIN (GLUCOPHAGE-XR) 500 MG 24 hr tablet Take 2 tablets  (1,000 mg total) by mouth daily with breakfast. 09/08/19   [name], DO  metoprolol tartrate (LOPRESSOR) 50 MG tablet Take 1 tablet (50 mg total) by mouth 2 (two) times daily. 11/07/19   [name], DO  predniSONE (DELTASONE) 20 MG tablet Take 2 tablets (40 mg total) by mouth daily with breakfast for 5 days. 01/28/20 02/02/20  [name], DO  tamsulosin (FLOMAX) 0.4 MG CAPS capsule Take 1 capsule (0.4 mg total) by mouth daily. 10/14/19   Wendling, Crosby Oyster, DO  TURMERIC CURCUMIN PO Take 750 capsules by mouth daily.    [provider]    Allergies    Patient has no known allergies.  Review of Systems   Review of Systems  Constitutional: Positive for fatigue. Negative for chills and fever.  HENT: Negative.   Respiratory: Positive for cough. Negative for shortness of breath.   Cardiovascular: Negative for chest pain.  Gastrointestinal: Positive for nausea. Negative for abdominal pain, blood in stool, constipation, diarrhea and vomiting.  Genitourinary: Negative for dysuria and frequency.  Musculoskeletal: Negative for arthralgias, back pain, myalgias and neck pain.  Skin: Negative for color change and rash.  Neurological: Positive for weakness and headaches. Negative for dizziness, tremors, seizures, syncope, speech difficulty, light-headedness and numbness.       Paresthesias  All other systems reviewed and are negative.   Physical Exam Updated Vital Signs BP (!) 158/91 (BP Location: Right Arm)   Pulse 80   Temp 98.6 F (37 C) (Oral)   Resp 16   SpO2 97%   Physical Exam Vitals and nursing note reviewed.  Constitutional:      General: He is not in acute distress.    Appearance: He is well-developed and well-nourished. He is not ill-appearing or diaphoretic.  HENT:     Head: Normocephalic and atraumatic.     Mouth/Throat:     Mouth: Oropharynx is clear and moist. Mucous membranes are moist.     Pharynx: Oropharynx is clear.   Eyes:     General:        Right eye: No discharge.        Left eye: No discharge.     Extraocular Movements: EOM normal.     Pupils: Pupils are equal, round, and reactive to light.  Cardiovascular:     Rate and Rhythm: Normal rate and regular rhythm.     Pulses: Intact distal pulses.     Heart sounds: Normal heart sounds. No murmur heard. No friction rub. No gallop.   Pulmonary:     Effort: Pulmonary effort is  normal. No respiratory distress.     Breath sounds: Normal breath sounds. No wheezing or rales.     Comments: Respirations equal and unlabored, patient able to speak in full sentences, lungs clear to auscultation bilaterally  Abdominal:     General: Bowel sounds are normal. There is no distension.     Palpations: Abdomen is soft. There is no mass.     Tenderness: There is no abdominal tenderness. There is no guarding.     Comments: Abdomen soft, nondistended, nontender to palpation in all quadrants without guarding or peritoneal signs   Musculoskeletal:        General: No deformity or edema.     Cervical back: Neck supple. No rigidity.  Skin:    General: Skin is warm and dry.     Capillary Refill: Capillary refill takes less than 2 seconds.  Neurological:     Mental Status: He is alert.     Motor: Weakness present.     Coordination: Coordination normal.     Comments: Speech is clear, able to follow commands CN III-XII intact Normal strength in bilateral upper extremities in proximal and distal muscles, and bilateral lower extremities with 4/5 strength Sensation normal to light and sharp touch, patient does report paresthesias in the hands and feet Moves extremities without ataxia, coordination intact No pronator drift.  Psychiatric:        Mood and Affect: Mood normal.        Behavior: Behavior normal.     ED Results / Procedures / Treatments   Labs (all labs ordered are listed, but only abnormal results are displayed) Labs Reviewed  COMPREHENSIVE METABOLIC PANEL  - Abnormal; Notable for the following components:      Result Value   Potassium 3.3 (*)    Glucose, Bld 152 (*)    Total Bilirubin 1.5 (*)    All other components within normal limits  CBC WITH DIFFERENTIAL/PLATELET - Abnormal; Notable for the following components:   Neutro Abs 7.9 (*)    All other components within normal limits  URINALYSIS, ROUTINE W REFLEX MICROSCOPIC - Abnormal; Notable for the following components:   Glucose, UA 50 (*)    All other components within normal limits  POC SARS CORONAVIRUS 2 AG -  ED - Abnormal; Notable for the following components:   SARS Coronavirus 2 Ag POSITIVE (*)    All other components within normal limits    EKG None  Radiology CT Head Wo Contrast  Result Date: 02/02/2020 CLINICAL DATA:  Dizziness, nonspecific, lower extremity weakness, tingling. Additional history provided: Patient reports frequent falls over the past few days, feeling unsteady, headache. a EXAM: CT HEAD WITHOUT CONTRAST TECHNIQUE: Contiguous axial images were obtained from the base of the skull through the vertex without intravenous contrast. COMPARISON:  No pertinent prior exams available for comparison. FINDINGS: Brain: Cerebral volume is normal. There is no acute intracranial hemorrhage. No demarcated cortical infarct. No extra-axial fluid collection. No evidence of intracranial mass. No midline shift. Partially empty sella turcica. Vascular: No hyperdense vessel.  Atherosclerotic calcifications. Skull: Visualized orbits show no acute finding. Sinuses/Orbits: Visualized orbits show no acute finding. Trace bilateral ethmoid and maxillary sinus mucosal thickening. Small bilateral maxillary sinus mucous retention cysts. IMPRESSION: No evidence of acute intracranial abnormality. Mild paranasal sinus disease as described. Electronically Signed   By: Kellie Simmering DO   On: 02/02/2020 08:39   DG Chest Port 1 View  Result Date: 02/02/2020 CLINICAL DATA:  Cough. Denies shortness of  breath or chest pain. History of diabetes hypertension EXAM: PORTABLE CHEST 1 VIEW COMPARISON:  None. FINDINGS: The heart size and mediastinal contours are within normal limits. Both lungs are clear. The visualized skeletal structures are unremarkable. IMPRESSION: No active disease. Electronically Signed   By: Dahlia Bailiff MD   On: 02/02/2020 08:16    Procedures Procedures (including critical care time)  Medications Ordered in ED Medications  potassium chloride SA (KLOR-CON) CR tablet 40 mEq (has no administration in time range)    ED Course  I have reviewed the triage vital signs and the nursing notes.  Pertinent labs & imaging results that were available during my care of the patient were reviewed by me and considered in my medical decision making (see chart for details).    MDM Rules/Calculators/A&P                         65 year old male presents with weakness, worsening in the lower extremities leading to some falls without injury, headaches, nausea.  Symptoms present over the past 4 days and worsening.  No history of prior strokes or neurologic symptoms, weakness present bilaterally and does not localize to one side.  No associated vision changes, speech changes, currently reports improvement in headache.  No associated fevers, neck or back pain.  Nausea but no associated vomiting, abdominal pain or abnormal bowel movements.  Has had some cough and was recently treated for bronchitis with prednisone, denies chest pain or shortness of breath.  Will begin evaluation with EKG, basic lab work, urinalysis, chest x-ray and head CT, as well as Covid test.  I have independently ordered, reviewed and interpreted all labs and imaging: CBC: No leukocytosis, normal hemoglobin CMP: Mild hypokalemia of 3.3, glucose of 152, no other electrolyte derangements, normal renal and liver function. UA: No signs of infection, no hematuria  Rapid antigen Covid test is positive  Chest x-ray is clear with  no active cardiopulmonary disease.  Head CT is unremarkable.  Given reassuring work-up thus far but with lower extremity weakness noted on exam bilaterally and paresthesias in the hands and feet will consult neurology.   Case discussed with Dr. Curly Shores with neurology, who is concerned for potential Guillain-Barr syndrome, but would like to see and examine the patient before ordering further imaging or testing.  Dr. Curly Shores has seen and evaluated patient, she feels that patient's weakness and sensory deficits do not fit the pattern consistent with Guillain-Barr syndrome and patient has intact ankle jerk reflexes.  She has ordered additional lab work including CK vitamin B12, zinc, copper and methylmalonic acid as well as MRIs of the brain, cervical and thoracic spine.  If this additional work-up is unremarkable patient can be discharged home with outpatient neurology follow-up, if there are any acute abnormalities neurology can be reconsulted for further recommendations.   At shift change care signed out to [place] pending MRIs and additional lab work, he will follow up on the studies and disposition appropriately.  Final Clinical Impression(s) / ED Diagnoses Final diagnoses:  Weakness  Acute nonintractable headache, unspecified headache type  COVID-19 virus infection    Rx / DC Orders ED Discharge Orders    None       Janet Berlin 02/02/20 1603    Charlesetta Shanks, MD 02/12/20 [phone]

## 2020-02-02 NOTE — Discharge Instructions (Signed)
Please read and follow all provided instructions.  Your diagnoses today include:  1. Weakness   2. Acute nonintractable headache, unspecified headache type   3. COVID-19 virus infection     Tests performed today include:  COVID testing - Positive  CT head  MRI brain, cervical spine, thoracic spine - no problems which would cause headache or weakness  Vital signs. See below for your results today.   Medications prescribed:   None  Take any prescribed medications only as directed.  Home care instructions:  Follow any educational materials contained in this packet.  Follow-up instructions: Please follow-up with your primary care provider and neurology in the next 7days for further evaluation of your symptoms.   Return instructions:   Please return to the Emergency Department if you experience worsening symptoms.   Return if you have worsening weakness in your arms or legs, slurred speech, trouble walking or talking, confusion, or trouble with your balance.   Please return if you have any other emergent concerns.  Additional Information:  Your vital signs today were: BP 137/82   Pulse 83   Temp 98.6 F (37 C) (Oral)   Resp 11   SpO2 95%  If your blood pressure (BP) was elevated above 135/85 this visit, please have this repeated by your doctor within one month. --------------

## 2020-02-02 NOTE — ED Provider Notes (Signed)
6:36 PM signout from [employer] at shift change. Patient has been seen by neurology. Pending labs and MRI of the brain, cervical and thoracic spine.   Imaging is negative. CK is normal. B12 is mildly elevated and not low.  Copper, zinc, MMA level still outstanding. Feel that patient can follow-up with PCP/neurology regarding these.  BP 137/82   Pulse 83   Temp 98.6 F (37 C) (Oral)   Resp 11   SpO2 95%   Discussed results with patient at bedside. He is agreeable to discharge to home. Currently symptoms are controlled. We discussed plan, he is anxious to leave.   Detailed discussion had with with patient regarding COVID-19 precautions and written instructions given as well.  We discussed need to isolate themselves for 5 days from onset of symptoms and have 24 hours of improvement prior to breaking isolation.  We discussed that when breaking isolation, mask wearing for 5 additional days is required.  We discussed signs symptoms to return which include worsening shortness of breath, trouble breathing, or increased work of breathing.  Also return with persistent vomiting, confusion, passing out, or if they have any other concerns. Counseled on the need for rest and good hydration. Discussed that high-risk contacts should be aware of positive result and they need to quarantine and be tested if they develop any symptoms. Patient verbalizes understanding.   Jon Townsend was evaluated in Emergency Department on 02/02/2020 for the symptoms described in the history of present illness. He was evaluated in the context of the global COVID-19 pandemic, which necessitated consideration that the patient might be at risk for infection with the SARS-CoV-2 virus that causes COVID-19. Institutional protocols and algorithms that pertain to the evaluation of patients at risk for COVID-19 are in a state of rapid change based on information released by regulatory bodies including the CDC and federal and state  organizations. These policies and algorithms were followed during the patient's care in the ED.     Carlisle Cater, PA-C 02/02/20 Pecola Leisure, MD 02/03/20 [id number]

## 2020-02-03 ENCOUNTER — Telehealth: Payer: Self-pay

## 2020-02-03 ENCOUNTER — Encounter (HOSPITAL_COMMUNITY): Payer: Self-pay | Admitting: Emergency Medicine

## 2020-02-03 ENCOUNTER — Inpatient Hospital Stay (HOSPITAL_COMMUNITY): Payer: Medicare Other

## 2020-02-03 ENCOUNTER — Inpatient Hospital Stay (HOSPITAL_COMMUNITY)
Admission: EM | Admit: 2020-02-03 | Discharge: 2020-02-19 | DRG: 094 | Disposition: A | Discharge status: Rehabilitation Facility | Payer: Medicare Other | Attending: Internal Medicine | Admitting: Internal Medicine

## 2020-02-03 ENCOUNTER — Emergency Department (HOSPITAL_COMMUNITY): Payer: Medicare Other

## 2020-02-03 ENCOUNTER — Other Ambulatory Visit: Payer: Self-pay

## 2020-02-03 DIAGNOSIS — R200 Anesthesia of skin: Secondary | ICD-10-CM | POA: Diagnosis present

## 2020-02-03 DIAGNOSIS — R109 Unspecified abdominal pain: Secondary | ICD-10-CM | POA: Diagnosis not present

## 2020-02-03 DIAGNOSIS — M4716 Other spondylosis with myelopathy, lumbar region: Secondary | ICD-10-CM | POA: Diagnosis present

## 2020-02-03 DIAGNOSIS — Z7984 Long term (current) use of oral hypoglycemic drugs: Secondary | ICD-10-CM | POA: Diagnosis not present

## 2020-02-03 DIAGNOSIS — M48061 Spinal stenosis, lumbar region without neurogenic claudication: Secondary | ICD-10-CM | POA: Diagnosis present

## 2020-02-03 DIAGNOSIS — Z8249 Family history of ischemic heart disease and other diseases of the circulatory system: Secondary | ICD-10-CM

## 2020-02-03 DIAGNOSIS — R338 Other retention of urine: Secondary | ICD-10-CM | POA: Diagnosis present

## 2020-02-03 DIAGNOSIS — N401 Enlarged prostate with lower urinary tract symptoms: Secondary | ICD-10-CM | POA: Diagnosis present

## 2020-02-03 DIAGNOSIS — R531 Weakness: Secondary | ICD-10-CM | POA: Diagnosis not present

## 2020-02-03 DIAGNOSIS — E785 Hyperlipidemia, unspecified: Secondary | ICD-10-CM | POA: Diagnosis present

## 2020-02-03 DIAGNOSIS — U071 COVID-19: Secondary | ICD-10-CM | POA: Diagnosis not present

## 2020-02-03 DIAGNOSIS — K560 Paralytic ileus: Secondary | ICD-10-CM | POA: Diagnosis not present

## 2020-02-03 DIAGNOSIS — Z79899 Other long term (current) drug therapy: Secondary | ICD-10-CM | POA: Diagnosis not present

## 2020-02-03 DIAGNOSIS — R933 Abnormal findings on diagnostic imaging of other parts of digestive tract: Secondary | ICD-10-CM | POA: Diagnosis not present

## 2020-02-03 DIAGNOSIS — R5381 Other malaise: Secondary | ICD-10-CM | POA: Diagnosis present

## 2020-02-03 DIAGNOSIS — I10 Essential (primary) hypertension: Secondary | ICD-10-CM | POA: Diagnosis not present

## 2020-02-03 DIAGNOSIS — Z6832 Body mass index (BMI) 32.0-32.9, adult: Secondary | ICD-10-CM

## 2020-02-03 DIAGNOSIS — E860 Dehydration: Secondary | ICD-10-CM | POA: Diagnosis present

## 2020-02-03 DIAGNOSIS — K9189 Other postprocedural complications and disorders of digestive system: Secondary | ICD-10-CM | POA: Diagnosis not present

## 2020-02-03 DIAGNOSIS — H409 Unspecified glaucoma: Secondary | ICD-10-CM | POA: Diagnosis present

## 2020-02-03 DIAGNOSIS — K529 Noninfective gastroenteritis and colitis, unspecified: Secondary | ICD-10-CM | POA: Diagnosis not present

## 2020-02-03 DIAGNOSIS — Z833 Family history of diabetes mellitus: Secondary | ICD-10-CM

## 2020-02-03 DIAGNOSIS — E1140 Type 2 diabetes mellitus with diabetic neuropathy, unspecified: Secondary | ICD-10-CM | POA: Diagnosis present

## 2020-02-03 DIAGNOSIS — H532 Diplopia: Secondary | ICD-10-CM | POA: Diagnosis present

## 2020-02-03 DIAGNOSIS — G610 Guillain-Barre syndrome: Principal | ICD-10-CM | POA: Diagnosis present

## 2020-02-03 DIAGNOSIS — Z8616 Personal history of COVID-19: Secondary | ICD-10-CM | POA: Diagnosis not present

## 2020-02-03 DIAGNOSIS — R140 Abdominal distension (gaseous): Secondary | ICD-10-CM | POA: Diagnosis not present

## 2020-02-03 DIAGNOSIS — Z87891 Personal history of nicotine dependence: Secondary | ICD-10-CM | POA: Diagnosis not present

## 2020-02-03 DIAGNOSIS — E1169 Type 2 diabetes mellitus with other specified complication: Secondary | ICD-10-CM | POA: Diagnosis present

## 2020-02-03 DIAGNOSIS — R296 Repeated falls: Secondary | ICD-10-CM | POA: Diagnosis present

## 2020-02-03 DIAGNOSIS — K5900 Constipation, unspecified: Secondary | ICD-10-CM | POA: Diagnosis present

## 2020-02-03 DIAGNOSIS — E669 Obesity, unspecified: Secondary | ICD-10-CM | POA: Diagnosis not present

## 2020-02-03 DIAGNOSIS — G629 Polyneuropathy, unspecified: Secondary | ICD-10-CM

## 2020-02-03 DIAGNOSIS — E1165 Type 2 diabetes mellitus with hyperglycemia: Secondary | ICD-10-CM | POA: Diagnosis not present

## 2020-02-03 DIAGNOSIS — T40605A Adverse effect of unspecified narcotics, initial encounter: Secondary | ICD-10-CM | POA: Diagnosis present

## 2020-02-03 DIAGNOSIS — K922 Gastrointestinal hemorrhage, unspecified: Secondary | ICD-10-CM | POA: Diagnosis not present

## 2020-02-03 DIAGNOSIS — K760 Fatty (change of) liver, not elsewhere classified: Secondary | ICD-10-CM | POA: Diagnosis not present

## 2020-02-03 DIAGNOSIS — D62 Acute posthemorrhagic anemia: Secondary | ICD-10-CM | POA: Diagnosis not present

## 2020-02-03 DIAGNOSIS — E871 Hypo-osmolality and hyponatremia: Secondary | ICD-10-CM | POA: Diagnosis present

## 2020-02-03 DIAGNOSIS — K921 Melena: Secondary | ICD-10-CM | POA: Diagnosis not present

## 2020-02-03 DIAGNOSIS — M47816 Spondylosis without myelopathy or radiculopathy, lumbar region: Secondary | ICD-10-CM | POA: Diagnosis not present

## 2020-02-03 DIAGNOSIS — K567 Ileus, unspecified: Secondary | ICD-10-CM | POA: Diagnosis not present

## 2020-02-03 DIAGNOSIS — R52 Pain, unspecified: Secondary | ICD-10-CM

## 2020-02-03 LAB — CBC WITH DIFFERENTIAL/PLATELET
Abs Immature Granulocytes: 0.07 10*3/uL (ref 0.00–0.07)
Basophils Absolute: 0.1 10*3/uL (ref 0.0–0.1)
Basophils Relative: 1 %
Eosinophils Absolute: 0 10*3/uL (ref 0.0–0.5)
Eosinophils Relative: 0 %
HCT: 50.6 % (ref 39.0–52.0)
Hemoglobin: 16.3 g/dL (ref 13.0–17.0)
Immature Granulocytes: 1 %
Lymphocytes Relative: 11 %
Lymphs Abs: 1.1 10*3/uL (ref 0.7–4.0)
MCH: 30.8 pg (ref 26.0–34.0)
MCHC: 32.2 g/dL (ref 30.0–36.0)
MCV: 95.5 fL (ref 80.0–100.0)
Monocytes Absolute: 1.1 10*3/uL — ABNORMAL HIGH (ref 0.1–1.0)
Monocytes Relative: 11 %
Neutro Abs: 7.7 10*3/uL (ref 1.7–7.7)
Neutrophils Relative %: 76 %
Platelets: 183 10*3/uL (ref 150–400)
RBC: 5.3 MIL/uL (ref 4.22–5.81)
RDW: 12.2 % (ref 11.5–15.5)
WBC: 10.1 10*3/uL (ref 4.0–10.5)
nRBC: 0 % (ref 0.0–0.2)

## 2020-02-03 LAB — COMPREHENSIVE METABOLIC PANEL
ALT: 16 U/L (ref 0–44)
AST: 28 U/L (ref 15–41)
Albumin: 3.3 g/dL — ABNORMAL LOW (ref 3.5–5.0)
Alkaline Phosphatase: 74 U/L (ref 38–126)
Anion gap: 12 (ref 5–15)
BUN: 27 mg/dL — ABNORMAL HIGH (ref 8–23)
CO2: 21 mmol/L — ABNORMAL LOW (ref 22–32)
Calcium: 8.5 mg/dL — ABNORMAL LOW (ref 8.9–10.3)
Chloride: 101 mmol/L (ref 98–111)
Creatinine, Ser: 0.79 mg/dL (ref 0.61–1.24)
GFR, Estimated: >60 mL/min (ref >60)
Glucose, Bld: 172 mg/dL — ABNORMAL HIGH (ref 70–99)
Potassium: 4.3 mmol/L (ref 3.5–5.1)
Sodium: 134 mmol/L — ABNORMAL LOW (ref 135–145)
Total Bilirubin: 1.7 mg/dL — ABNORMAL HIGH (ref 0.3–1.2)
Total Protein: 6 g/dL — ABNORMAL LOW (ref 6.5–8.1)

## 2020-02-03 LAB — SEDIMENTATION RATE: Sed Rate: 5 mm/hr (ref 0–16)

## 2020-02-03 LAB — HEMOGLOBIN A1C
Hgb A1c MFr Bld: 7.7 % — ABNORMAL HIGH (ref 4.8–5.6)
Mean Plasma Glucose: 174.29 mg/dL

## 2020-02-03 LAB — GLUCOSE, CAPILLARY: Glucose-Capillary: 145 mg/dL — ABNORMAL HIGH (ref 70–99)

## 2020-02-03 LAB — HIV ANTIBODY (ROUTINE TESTING W REFLEX): HIV Screen 4th Generation wRfx: NONREACTIVE

## 2020-02-03 LAB — CK: Total CK: 216 U/L (ref 49–397)

## 2020-02-03 LAB — D-DIMER, QUANTITATIVE: D-Dimer, Quant: 0.76 ug/mL-FEU — ABNORMAL HIGH (ref 0.00–0.50)

## 2020-02-03 MED ORDER — LIDOCAINE HCL (PF) 1 % IJ SOLN
5.0000 mL | Freq: Once | INTRAMUSCULAR | Status: AC
  Administered 2020-02-03: 5 mL

## 2020-02-03 MED ORDER — TAMSULOSIN HCL 0.4 MG PO CAPS
0.4000 mg | ORAL_CAPSULE | Freq: Every day | ORAL | Status: DC
  Administered 2020-02-04 – 2020-02-19 (×16): 0.4 mg via ORAL
  Filled 2020-02-03 (×16): qty 1

## 2020-02-03 MED ORDER — ACETAMINOPHEN 325 MG PO TABS
650.0000 mg | ORAL_TABLET | Freq: Four times a day (QID) | ORAL | Status: DC | PRN
  Administered 2020-02-03 – 2020-02-11 (×7): 650 mg via ORAL
  Filled 2020-02-03 (×8): qty 2

## 2020-02-03 MED ORDER — AMLODIPINE BESYLATE 5 MG PO TABS
5.0000 mg | ORAL_TABLET | Freq: Every day | ORAL | Status: DC
  Administered 2020-02-04 – 2020-02-19 (×16): 5 mg via ORAL
  Filled 2020-02-03 (×16): qty 1

## 2020-02-03 MED ORDER — FENTANYL CITRATE (PF) 100 MCG/2ML IJ SOLN
25.0000 ug | INTRAMUSCULAR | Status: DC | PRN
  Administered 2020-02-03 – 2020-02-06 (×8): 25 ug via INTRAVENOUS
  Filled 2020-02-03 (×8): qty 2

## 2020-02-03 MED ORDER — METOPROLOL TARTRATE 50 MG PO TABS
50.0000 mg | ORAL_TABLET | Freq: Two times a day (BID) | ORAL | Status: DC
  Administered 2020-02-03 – 2020-02-19 (×32): 50 mg via ORAL
  Filled 2020-02-03 (×32): qty 1

## 2020-02-03 MED ORDER — ATORVASTATIN CALCIUM 40 MG PO TABS
40.0000 mg | ORAL_TABLET | Freq: Every day | ORAL | Status: DC
  Administered 2020-02-03 – 2020-02-18 (×16): 40 mg via ORAL
  Filled 2020-02-03 (×16): qty 1

## 2020-02-03 MED ORDER — IMMUNE GLOBULIN (HUMAN) 10 GM/100ML IV SOLN
400.0000 mg/kg | INTRAVENOUS | Status: DC
  Filled 2020-02-03: qty 450

## 2020-02-03 MED ORDER — ACETAMINOPHEN 650 MG RE SUPP: 650.0000 mg | Freq: Four times a day (QID) | RECTAL | Status: DC | PRN

## 2020-02-03 MED ORDER — INSULIN ASPART 100 UNIT/ML ~~LOC~~ SOLN
0.0000 [IU] | Freq: Three times a day (TID) | SUBCUTANEOUS | Status: DC
  Administered 2020-02-04: 2 [IU] via SUBCUTANEOUS
  Administered 2020-02-04: 3 [IU] via SUBCUTANEOUS
  Administered 2020-02-04 – 2020-02-06 (×6): 2 [IU] via SUBCUTANEOUS
  Administered 2020-02-06 – 2020-02-07 (×2): 3 [IU] via SUBCUTANEOUS
  Administered 2020-02-07 (×2): 2 [IU] via SUBCUTANEOUS
  Administered 2020-02-08 (×2): 3 [IU] via SUBCUTANEOUS
  Administered 2020-02-08: 2 [IU] via SUBCUTANEOUS
  Administered 2020-02-09: 3 [IU] via SUBCUTANEOUS
  Administered 2020-02-09: 2 [IU] via SUBCUTANEOUS
  Administered 2020-02-09: 3 [IU] via SUBCUTANEOUS
  Administered 2020-02-10: 2 [IU] via SUBCUTANEOUS
  Administered 2020-02-10: 3 [IU] via SUBCUTANEOUS
  Administered 2020-02-10: 5 [IU] via SUBCUTANEOUS
  Administered 2020-02-11: 2 [IU] via SUBCUTANEOUS
  Administered 2020-02-11: 5 [IU] via SUBCUTANEOUS
  Administered 2020-02-11: 2 [IU] via SUBCUTANEOUS
  Administered 2020-02-12: 1 [IU] via SUBCUTANEOUS
  Administered 2020-02-12: 3 [IU] via SUBCUTANEOUS
  Administered 2020-02-12 – 2020-02-13 (×2): 2 [IU] via SUBCUTANEOUS
  Administered 2020-02-13: 3 [IU] via SUBCUTANEOUS
  Administered 2020-02-13 – 2020-02-14 (×2): 5 [IU] via SUBCUTANEOUS
  Administered 2020-02-14 (×2): 2 [IU] via SUBCUTANEOUS
  Administered 2020-02-15 (×2): 3 [IU] via SUBCUTANEOUS
  Administered 2020-02-15: 5 [IU] via SUBCUTANEOUS
  Administered 2020-02-16 (×2): 3 [IU] via SUBCUTANEOUS
  Administered 2020-02-16 – 2020-02-17 (×2): 2 [IU] via SUBCUTANEOUS
  Administered 2020-02-17: 5 [IU] via SUBCUTANEOUS
  Administered 2020-02-18: 2 [IU] via SUBCUTANEOUS
  Administered 2020-02-18: 5 [IU] via SUBCUTANEOUS
  Administered 2020-02-18: 3 [IU] via SUBCUTANEOUS
  Administered 2020-02-19: 2 [IU] via SUBCUTANEOUS
  Administered 2020-02-19: 3 [IU] via SUBCUTANEOUS

## 2020-02-03 NOTE — ED Provider Notes (Signed)
Sign out note  65 year old male with progressive weakness, frequent falls and difficulty walking.  Covid positive yesterday but minimal symptoms.  Eval by neurology, MRI brain, C-spine, T-spine yesterday were grossly within normal limits.  Came back today with worsening symptoms.  Plan to obtain CSF to further work-up, rule out GBS.  Dr. Carmelia Roller unsuccessful with LP and IR was also unsuccessful.  Reviewed with radiology as well as neurology.  For now, the next step will be to obtain an MRI both for diagnostic purposes of his weakness as well as to help guide repeat LP attempt tomorrow.  His negative inspiratory force is within normal limits.  Will admit to hospital service for further observation and work-up.   Lucrezia Starch, MD 02/03/20 1946

## 2020-02-03 NOTE — ED Notes (Signed)
Pt back in room from [facility]. LP unsuccessful.

## 2020-02-03 NOTE — Consult Note (Signed)
NEURO HOSPITALIST CONSULT NOTE   Requestig physician: Dr. Hal Hope  Reason for Consult: Progressive tetraparesis beginning Saturday night  History obtained from:  Patient and Chart     HPI:                                                                                                                                          Jon Townsend is an 65 y.o. male currently positive for Covid but asymptomatic, also with a history of Covid infection in 2021 (s/p antibody infusion), obesity, DM and HTN, who re-presents to the ED for re-assessment of progressive tetraparesis beginning Saturday night. The patient was in his USOH on Friday when he started to feel poorly with nausea, poor appetite, general malaise and dull headache. On Saturday evening he started to experience paresthesias in his hands and feet as well as BLE weakness worse than upper extremity weakness. By Sunday, he noted that his weakness had worsened. When he tried to walk, his legs gave out from under him. He dragged himself to a recliner and spent the rest of the day there. On Monday morning he was significantly weaker, being unable to get out of bed, so he was brought to the ED. Neurology was consulted, with recommendation for MRI brain, C-spine, T-spine with and without contrast. B12 level was supratherapeutic, CK was normal. Imaging came back normal. Copper, zinc and MMA were also recommended. Of note, the patient's reflexes were intact per Dr. Lyn Records note from Monday.    Additional history was obtained by Dr. Curly Shores yesterday, which has been reviewed: "He states since Saturday night/sunday morning he has been been feeling unsteady with pins and needles feeling in all of his limbs.  This started as paresthesias just in his fingertips, but then moved to involve his hands and his feet.  He states he is able to move his extremities but feel weaker than before. (Rt>Lt). Pt states he had sinus infection in December  (12/16/2019) and received prednisone 40 mg for 5 days and then got better for about 2 weeks.  He then again had cough and bronchitis for which he got Prednisone for 5 more days, which he completed on 1/23 (yesterday); due to insurance issues he was not seen in the office at that time.  He states on Friday he developed head ache, cough and nausea for which he took over the counter pain and nausea medication and his headache got better on Saturday. Pt denies any fever, dizziness, vomiting , abdominal pain and diarrhea. He states he normally have case of bronchitis this time of the year. Pt was found positive for COVID this morning."  The patient re-presented to day after his weakness at home continued to worsen. He in fact was so weak last night that after returning  to home from the ED, he could not get out of his care. FD had to be called to help him out of the car and place him in a wheelchair for transport into his house.   He denies any fever or cough. He has no difficulty breathing. He has continued paresthesias and numbness of his hands and feet. He has no bulbar symptoms. His strength is rated by patient as being 25% of his baseline in his arms and legs. He is having severe muscle-spasm pain in his right hip region and lower back, which is exacerbated by movement.    Past Medical History:  Diagnosis Date  . Allergy   . BPH (benign prostatic hyperplasia)   . Diabetes mellitus type 2 in obese ([place])   . Glaucoma   . Hypertension     Past Surgical History:  Procedure Laterality Date  . Broken finger    . TONSILLECTOMY      Family History  Problem Relation Age of Onset  . Cancer Father   . Early death Father   . Diabetes Sister   . Heart disease Sister   . COPD Sister   . Diabetes Sister   . Colon cancer Neg Hx   . Esophageal cancer Neg Hx   . Stomach cancer Neg Hx   . Rectal cancer Neg Hx               Social History:  reports that he has quit smoking. He has never used smokeless  tobacco. He reports that he does not drink alcohol and does not use drugs.  No Known Allergies  MEDICATIONS:                                                                                                                     Prior to Admission:  Medications Prior to Admission  Medication Sig Dispense Refill Last Dose  . amLODipine (NORVASC) 5 MG tablet Take 1 tablet (5 mg total) by mouth daily. 90 tablet 1 02/03/2020 at am  . atorvastatin (LIPITOR) 40 MG tablet Take 1 tablet (40 mg total) by mouth daily. (Patient taking differently: Take 40 mg by mouth at bedtime.) 90 tablet 3 02/02/2020 at pm  . Coenzyme Q10 (COQ10) 100 MG CAPS Take 100 mg by mouth in the morning.   02/03/2020 at am  . Ergocalciferol (VITAMIN D2) 50 MCG (2000 UT) TABS Take 2,000 mg by mouth daily.   02/03/2020 at am  . fexofenadine (ALLEGRA) 180 MG tablet Take 180 mg by mouth in the morning.   02/03/2020 at am  . ibuprofen (ADVIL) 200 MG tablet Take 400 mg by mouth every 6 (six) hours as needed for mild pain (or headaches).     Javier Docker Oil 1000 MG CAPS Take 1,000 mg by mouth daily.   02/03/2020 at am  . meloxicam (MOBIC) 7.5 MG tablet Take 1 tablet (7.5 mg total) by mouth 2 (two) times daily as needed for pain. 30 tablet 2   . metFORMIN (  GLUCOPHAGE-XR) 500 MG 24 hr tablet Take 2 tablets (1,000 mg total) by mouth daily with breakfast. 60 tablet 5 02/03/2020 at am  . metoprolol tartrate (LOPRESSOR) 50 MG tablet Take 1 tablet (50 mg total) by mouth 2 (two) times daily. (Patient taking differently: Take 50 mg by mouth in the morning and at bedtime.) 180 tablet 3 02/03/2020 at 0800  . tamsulosin (FLOMAX) 0.4 MG CAPS capsule Take 1 capsule (0.4 mg total) by mouth daily. 90 capsule 3 02/03/2020 at am  . vitamin C (ASCORBIC ACID) 500 MG tablet Take 500-1,000 mg by mouth daily.   02/03/2020 at am  . Blood Glucose Monitoring Suppl (ONETOUCH VERIO) w/Device KIT Use once daily to check blood sugar 1 kit 0   . glucose blood (ONETOUCH VERIO) test  strip Use as once daily to check blood sugar. 100 each 3   . Lancets (ONETOUCH ULTRASOFT) lancets Use daily to check blood sugar. 100 each 3     ROS:                                                                                                                                       As per HPI.    Blood pressure (!) 151/85, pulse 76, temperature 98 F (36.7 C), temperature source Oral, resp. rate (!) 21, height $RemoveBe'6\' 1"'dAITXSrAS$  (1.854 m), weight 113.4 kg, SpO2 98 %.   General Examination:                                                                                                       Physical Exam  HEENT-  [place]/AT    Lungs- Respirations unlabored Extremities- No edema  Neurological Examination Mental Status: Awake and alert. Thought content appropriate.  Fully oriented. Speech fluent without evidence of aphasia.  Able to follow all commands without difficulty. Good insight.  Cranial Nerves: II: Visual fields intact with no extinction to DSS. PERRL.  III,IV, VI: No ptosis. EOMI.  V,VII: No facial droop or bulbar weakness noted. Temp sensation equal bilaterally  VIII: hearing intact to voice IX,X: No hoarseness or hypophonia XI: Symmetric XII: Midline tongue extension Motor: RUE 4-/5 deltoid, biceps, triceps and grip.  LUE 4-/5 deltoid, biceps, triceps and grip. RLE 2/5 hip flexion and knee extension, 3/5 knee flexion, 4-/5 ADF and APF LLE 3/5 hip flexion and knee extension, 4-/5 knee flexion, 4-/5 ADF and APF Sensory: Decreased temp and FT sensation in upper extremities extending from mid-upper arm to fingertips, worsens as one progresses  distally. Pins and needles dysesthesias are present to FT stimulation as well, worse distally.  Decreased temp and FT sensation in lower extremities extending from mid-thighs to toes, worsens as one progresses distally. Pins and needles dysesthesias are present to FT stimulation as well, worse distally. Severely impaired proprioception to toes bilaterally.   Deep Tendon Reflexes: Trace right brachioradialis, biceps and triceps. 0 left brachioradialis and biceps, trace left triceps. 0 bilateral patellae and achilles on multiple trials.  Plantars: Mute bilaterally Cerebellar: No ataxia with FNF bilaterally  Gait: Deferred   Lab Results: Basic Metabolic Panel: Recent Labs  Lab 02/02/20 0800 02/03/20 1738  NA 139 134*  K 3.3* 4.3  CL 101 101  CO2 27 21*  GLUCOSE 152* 172*  BUN 16 27*  CREATININE 0.95 0.79  CALCIUM 9.0 8.5*    CBC: Recent Labs  Lab 02/02/20 0800 02/03/20 1734  WBC 10.4 10.1  NEUTROABS 7.9* 7.7  HGB 16.3 16.3  HCT 47.4 50.6  MCV 91.3 95.5  PLT 200 183    Cardiac Enzymes: Recent Labs  Lab 02/02/20 1556 02/03/20 1738  CKTOTAL 96 216    Lipid Panel: No results for input(s): CHOL, TRIG, HDL, CHOLHDL, VLDL, LDLCALC in the last 168 hours.  Imaging: CT Head Wo Contrast  Result Date: 02/02/2020 CLINICAL DATA:  Dizziness, nonspecific, lower extremity weakness, tingling. Additional history provided: Patient reports frequent falls over the past few days, feeling unsteady, headache. a EXAM: CT HEAD WITHOUT CONTRAST TECHNIQUE: Contiguous axial images were obtained from the base of the skull through the vertex without intravenous contrast. COMPARISON:  No pertinent prior exams available for comparison. FINDINGS: Brain: Cerebral volume is normal. There is no acute intracranial hemorrhage. No demarcated cortical infarct. No extra-axial fluid collection. No evidence of intracranial mass. No midline shift. Partially empty sella turcica. Vascular: No hyperdense vessel.  Atherosclerotic calcifications. Skull: Visualized orbits show no acute finding. Sinuses/Orbits: Visualized orbits show no acute finding. Trace bilateral ethmoid and maxillary sinus mucosal thickening. Small bilateral maxillary sinus mucous retention cysts. IMPRESSION: No evidence of acute intracranial abnormality. Mild paranasal sinus disease as described.  Electronically Signed   By: Kellie Simmering DO   On: 02/02/2020 08:39   MR BRAIN W WO CONTRAST  Result Date: 02/02/2020 CLINICAL DATA:  Acute stroke. Dizziness. Lower extremity weakness. Frequent falling. EXAM: MRI HEAD WITHOUT AND WITH CONTRAST TECHNIQUE: Multiplanar, multiecho pulse sequences of the brain and surrounding structures were obtained without and with intravenous contrast. CONTRAST:  35mL GADAVIST GADOBUTROL 1 MMOL/ML IV SOLN COMPARISON:  Head CT same day FINDINGS: Brain: The brain has a normal appearance without evidence of malformation, atrophy, old or acute small or large vessel infarction, mass lesion, hemorrhage, hydrocephalus or extra-axial collection. After contrast administration, no abnormal enhancement occurs. Vascular: Major vessels at the base of the brain show flow. Venous sinuses appear patent. Skull and upper cervical spine: Normal. Sinuses/Orbits: Clear/normal. Other: None significant. IMPRESSION: Normal examination. No abnormality seen to explain the clinical presentation. Electronically Signed   By: Nelson Chimes M.D.   On: 02/02/2020 17:44   MR CERVICAL SPINE W WO CONTRAST  Result Date: 02/02/2020 CLINICAL DATA:  Acute progressive myelopathy. EXAM: MRI CERVICAL SPINE WITHOUT AND WITH CONTRAST TECHNIQUE: Multiplanar and multiecho pulse sequences of the cervical spine, to include the craniocervical junction and cervicothoracic junction, were obtained without and with intravenous contrast. CONTRAST:  61mL GADAVIST GADOBUTROL 1 MMOL/ML IV SOLN, 84mL GADAVIST GADOBUTROL 1 MMOL/ML IV SOLN COMPARISON:  None. FINDINGS: Alignment: Normal Vertebrae: Normal Cord: Normal.  No cord compression. No focal cord lesion. The study does suffer from some motion degradation but no abnormality is suspected or documented. No abnormal contrast enhancement. Posterior Fossa, vertebral arteries, paraspinal tissues: Negative Disc levels: Normal from the foramen magnum through C3-4. C4-5: Mild uncovertebral  hypertrophy.  No compressive stenosis. C5-6: Uncovertebral hypertrophy right more than left. Right foraminal narrowing that could affect the right C6 nerve. C6-7: Disc bulge. No compressive narrowing of the canal or foramina. C7-T1: Minimal uncovertebral prominence.  No significant stenosis. IMPRESSION: 1. No focal cord lesion. 2. Degenerative spondylosis as outlined above. Right foraminal narrowing at C5-6 that could affect the right C6 nerve. No other compressive canal or foraminal stenosis. Electronically Signed   By: Nelson Chimes M.D.   On: 02/02/2020 17:47   MR THORACIC SPINE W WO CONTRAST  Result Date: 02/02/2020 CLINICAL DATA:  Acute or progressive myelopathy. EXAM: MRI THORACIC WITHOUT AND WITH CONTRAST TECHNIQUE: Multiplanar and multiecho pulse sequences of the thoracic spine were obtained without and with intravenous contrast. CONTRAST:  36mL GADAVIST GADOBUTROL 1 MMOL/ML IV SOLN COMPARISON:  None. FINDINGS: Alignment:  Normal Vertebrae: Normal.  No fracture or primary lesion. Cord: No cord compression or primary cord lesion. No abnormal T2 signal or contrast enhancement. Paraspinal and other soft tissues: Negative Disc levels: No significant disc level pathology. No disc herniation. No compressive stenosis of the canal. No significant facet arthropathy. No significant foraminal stenosis. Some degenerative T2 signal in the lower thoracic discs, but without evidence of enhancement or endplate changes to suggest infection. IMPRESSION: Negative examination. No evidence of cord compression or primary cord lesion. No compressive canal or foraminal stenosis. No evidence of inflammatory arthropathy. Some degenerative T2 signal in the lower thoracic discs, but without evidence of enhancement or endplate changes to suggest infection. Electronically Signed   By: Nelson Chimes M.D.   On: 02/02/2020 17:49   DG Fluoro Rm 1-60 Min  Result Date: 02/03/2020 CLINICAL DATA:  Weakness, leg weakness and numbness in the  lower extremities and hands. History of COVID infection in this 65 year old male EXAM: DIAGNOSTIC LUMBAR PUNCTURE UNDER FLUOROSCOPIC GUIDANCE FLUOROSCOPY TIME:  Fluoroscopy Time:  1 minutes 20 seconds Radiation Exposure Index (if provided by the fluoroscopic device): 11.6 mGy Number of Acquired Spot Images: 1 PROCEDURE: Informed consent was obtained from the patient prior to the procedure, including potential complications of headache, allergy, and pain. With the patient prone, the lower back was prepped with Betadine. 1% Lidocaine was used for local anesthesia. Lumbar puncture was attempted at the L4-5, L3-4 and L2-3 levels level using a 20, 5 inch gauge needle. Access was unsuccessful despite multiple attempts by 2 different radiologists. Attempts were made in both the oblique and prone position. Saved images show a needle adjacent to the L3-4 level. This was for leveling purposes only. Attempts were made to access the lucent area adjacent to spinous processes at this level on both sides of the spinous process, at the level below and the level above without success. Lateral image saved due to difficulty in accessing the spinal canal with 5 inch needle shows that with approximately 1-2 inches outside of the skin there was still a distance to travel to access the canal. Final set of images shows no significant change with pillow in place under the abdomen in the appearance of the spine. IMPRESSION: 1. Unsuccessful lumbar puncture attempt at multiple levels as outlined above. 2. With suggest MRI of the lumbar spine for further evaluation to assess both the nature of  degenerative changes and the position of the conus for further attempts, potentially in a biplane room which would aid in needle localization and allow for positioning that might improve access to the spinal canal. These results were called by telephone at the time of interpretation on 02/03/2020 at 5:22 pm to provider Dr. Roslynn Amble, who verbally  acknowledged these results. Electronically Signed   By: Zetta Bills M.D.   On: 02/03/2020 17:59   DG Chest Port 1 View  Result Date: 02/02/2020 CLINICAL DATA:  Cough. Denies shortness of breath or chest pain. History of diabetes hypertension EXAM: PORTABLE CHEST 1 VIEW COMPARISON:  None. FINDINGS: The heart size and mediastinal contours are within normal limits. Both lungs are clear. The visualized skeletal structures are unremarkable. IMPRESSION: No active disease. Electronically Signed   By: Dahlia Bailiff MD   On: 02/02/2020 08:16    Assessment: 65 year old male re-presenting with worsening tetraparesis and sensory disturbance in the context of repeat Covid infection without fever or pulmonary symptoms, but with general malaise and anorexia 1. Exam findings and history of progressive sensory disturbance and motor weakness with areflexia in the setting of an immunological trigger (Covid infection) are most consistent with GBS.  2. LP was attempted twice, once under fluoro, but unable to obtain CSF.  3. MRI brain: Normal examination.  4. MRI C-spine:  No focal cord lesion. Degenerative spondylosis. Right foraminal narrowing at C5-6 that could affect the right C6 nerve. No other compressive canal or foraminal stenosis. 5. MRI T-spine: Negative examination. No evidence of cord compression or primary cord lesion. No compressive canal or foraminal stenosis. No evidence of inflammatory arthropathy. Some degenerative T2 signal in the lower thoracic discs, but without evidence of enhancement or endplate changes to suggest infection. 6. Overall presentation is not consistent with myasthenia gravis. Not a myositis based on normal CK level.   Recommendations: 1. Start empiric IVIG 400 mg/kg qd x 5 days (ordered). Discussed with patient who wishes to proceed with treatment. Benefits of waiting for a 3rd LP attempt are outweighed by risks of clinical worsening. 2. RT consult for FVC and NIF qd.  3.  PT/OT 4. Anti-GQ1b and anti-GM1 antibody levels (ordered).  5. Pain management with fentanyl. Avoid muscle relaxers.   Electronically signed: Dr. Kerney Elbe 02/03/2020, 9:05 PM

## 2020-02-03 NOTE — ED Provider Notes (Signed)
[facility] EMERGENCY DEPARTMENT Provider Note   CSN: [id number] Arrival date & time: 02/03/20  1213     History Chief Complaint  Patient presents with  . Fall  . Numbness    Jon Townsend is a 65 y.o. male.  Patient with c/o recent frequent falls and generalized weakness/trouble walking. States started ~ 4 days ago, noted nausea, poor appetite, general malaise and dull headache. Symptoms acute onset, mild-moderate, constant, persistent. States then over past few days, several falls related to bilateral leg weakness. Denies specific injury w fall. States also w bil hand/foot numbness/tingling sensation. Notes very little po intake for three days due to no appetite. Denies trouble swallowing, or any choking or gagging. Denies any eye pain or visual changes. No change in speech. No recent febrile illness. Did test positive for covid yesterday. Has not been vaccinated. Denies cough, sob or fever. Was seen in ED yesterday for same, and evaluated by neurology then - indicates CTs and MRIs negative.   The history is provided by the patient and a relative.  Fall Associated symptoms include headaches. Pertinent negatives include no chest pain, no abdominal pain and no shortness of breath.       Past Medical History:  Diagnosis Date  . Allergy   . BPH (benign prostatic hyperplasia)   . Diabetes mellitus type 2 in obese ([place])   . Glaucoma   . Hypertension     Patient Active Problem List   Diagnosis Date Noted  . Diabetes mellitus type 2 in obese ([place])   . BPH (benign prostatic hyperplasia)     Past Surgical History:  Procedure Laterality Date  . Broken finger    . TONSILLECTOMY         Family History  Problem Relation Age of Onset  . Cancer Father   . Early death Father   . Diabetes Sister   . Heart disease Sister   . COPD Sister   . Diabetes Sister   . Colon cancer Neg Hx   . Esophageal cancer Neg Hx   . Stomach cancer Neg Hx   . Rectal cancer  Neg Hx     Social History   Tobacco Use  . Smoking status: Former Research scientist (life sciences)  . Smokeless tobacco: Never Used  . Tobacco comment: Quit 35 years ago  Substance Use Topics  . Alcohol use: Never  . Drug use: Never    Home Medications Prior to Admission medications   Medication Sig Start Date End Date Taking? Authorizing Provider  amLODipine (NORVASC) 5 MG tablet Take 1 tablet (5 mg total) by mouth daily. 09/08/19   [name], DO  atorvastatin (LIPITOR) 40 MG tablet Take 1 tablet (40 mg total) by mouth daily. 11/07/19   [name], DO  Blood Glucose Monitoring Suppl [facility] VERIO) w/Device KIT Use once daily to check blood sugar 01/23/18   Wendling, Crosby Oyster, DO  Coenzyme Q10 (COQ10) 100 MG CAPS Take by mouth.    [provider]  Ergocalciferol (VITAMIN D2) 50 MCG (2000 UT) TABS Take 2,000 mg by mouth daily.    [provider]  glucose blood (ONETOUCH VERIO) test strip Use as once daily to check blood sugar. 01/23/18   Wendling, Crosby Oyster, DO  Krill Oil 1000 MG CAPS Take 1,000 mg by mouth daily.    [provider]  Lancets [facility] ULTRASOFT) lancets Use daily to check blood sugar. 01/23/18   [name], DO  meloxicam (MOBIC) 7.5 MG tablet  Take 1 tablet (7.5 mg total) by mouth 2 (two) times daily as needed for pain. 12/10/19   Leandrew Koyanagi, MD  metFORMIN (GLUCOPHAGE-XR) 500 MG 24 hr tablet Take 2 tablets (1,000 mg total) by mouth daily with breakfast. 09/08/19   [name], DO  metoprolol tartrate (LOPRESSOR) 50 MG tablet Take 1 tablet (50 mg total) by mouth 2 (two) times daily. 11/07/19   [name], DO  tamsulosin (FLOMAX) 0.4 MG CAPS capsule Take 1 capsule (0.4 mg total) by mouth daily. 10/14/19   Wendling, Crosby Oyster, DO  TURMERIC CURCUMIN PO Take 750 capsules by mouth daily.    [provider]    Allergies    Patient has no known allergies.  Review of Systems   Review of  Systems  Constitutional: Negative for chills and fever.  HENT: Negative for sore throat and trouble swallowing.   Eyes: Negative for pain and visual disturbance.  Respiratory: Negative for cough and shortness of breath.   Cardiovascular: Negative for chest pain and leg swelling.  Gastrointestinal: Negative for abdominal pain, diarrhea and vomiting.  Genitourinary: Negative for dysuria and flank pain.  Musculoskeletal: Negative for back pain, neck pain and neck stiffness.  Skin: Negative for rash.  Neurological: Positive for weakness, numbness and headaches. Negative for speech difficulty.  Hematological: Does not bruise/bleed easily.  Psychiatric/Behavioral: Negative for confusion.    Physical Exam Updated Vital Signs BP (!) 159/93 (BP Location: Right Arm)   Pulse 79   Temp 98 F (36.7 C) (Oral)   Resp 14   Ht 1.854 m ($Remove'6\' 1"'[id number]$ )   Wt 113.4 kg   SpO2 97%   BMI 32.98 kg/m   Physical Exam Vitals and nursing note reviewed.  Constitutional:      Appearance: Normal appearance. He is well-developed.  HENT:     Head: Atraumatic.     Comments: No sinus or temporal tenderness.     Nose: Nose normal.     Mouth/Throat:     Mouth: Mucous membranes are moist.     Pharynx: Oropharynx is clear.  Eyes:     General: No scleral icterus.    Extraocular Movements: Extraocular movements intact.     Conjunctiva/sclera: Conjunctivae normal.     Pupils: Pupils are equal, round, and reactive to light.  Neck:     Vascular: No carotid bruit.     Trachea: No tracheal deviation.     Comments: No stiffness or rigidity. No meningismus.  Cardiovascular:     Rate and Rhythm: Normal rate and regular rhythm.     Pulses: Normal pulses.     Heart sounds: Normal heart sounds. No murmur heard. No friction rub. No gallop.   Pulmonary:     Effort: Pulmonary effort is normal. No accessory muscle usage or respiratory distress.     Breath sounds: Normal breath sounds.  Chest:     Chest wall: No tenderness.   Abdominal:     General: Bowel sounds are normal. There is no distension.     Palpations: Abdomen is soft.     Tenderness: There is no abdominal tenderness. There is no guarding.  Genitourinary:    Comments: No cva tenderness. Musculoskeletal:        General: No swelling or tenderness.     Cervical back: Normal range of motion and neck supple. No rigidity.     Comments: CTLS spine, non tender, aligned, no step off.   Skin:    General: Skin is warm and dry.  Findings: No rash.     Comments: Superficial small scabs to anterior knees. No sign of infection to area.   Neurological:     Mental Status: He is alert.     Comments: Alert, speech clear. No dysarthria or aphasia. No pronator drift. Motor: mild distal bilateral upper extremity weakness. Mod proximal bilateral lower ext weakness, mild distal weakness. Unable to elicit achilles or patellar reflex.   Psychiatric:        Mood and Affect: Mood normal.     ED Results / Procedures / Treatments   Labs (all labs ordered are listed, but only abnormal results are displayed) Results for orders placed or performed during the hospital encounter of 02/02/20  Comprehensive metabolic panel  Result Value Ref Range   Sodium 139 135 - 145 mmol/L   Potassium 3.3 (L) 3.5 - 5.1 mmol/L   Chloride 101 98 - 111 mmol/L   CO2 27 22 - 32 mmol/L   Glucose, Bld 152 (H) 70 - 99 mg/dL   BUN 16 8 - 23 mg/dL   Creatinine, Ser 0.95 0.61 - 1.24 mg/dL   Calcium 9.0 8.9 - 10.3 mg/dL   Total Protein 6.6 6.5 - 8.1 g/dL   Albumin 3.5 3.5 - 5.0 g/dL   AST 15 15 - 41 U/L   ALT 19 0 - 44 U/L   Alkaline Phosphatase 77 38 - 126 U/L   Total Bilirubin 1.5 (H) 0.3 - 1.2 mg/dL   GFR, Estimated >60 >60 mL/min   Anion gap 11 5 - 15  CBC with Differential  Result Value Ref Range   WBC 10.4 4.0 - 10.5 K/uL   RBC 5.19 4.22 - 5.81 MIL/uL   Hemoglobin 16.3 13.0 - 17.0 g/dL   HCT 47.4 39.0 - 52.0 %   MCV 91.3 80.0 - 100.0 fL   MCH 31.4 26.0 - 34.0 pg   MCHC 34.4  30.0 - 36.0 g/dL   RDW 12.3 11.5 - 15.5 %   Platelets 200 150 - 400 K/uL   nRBC 0.0 0.0 - 0.2 %   Neutrophils Relative % 76 %   Neutro Abs 7.9 (H) 1.7 - 7.7 K/uL   Lymphocytes Relative 15 %   Lymphs Abs 1.6 0.7 - 4.0 K/uL   Monocytes Relative 8 %   Monocytes Absolute 0.9 0.1 - 1.0 K/uL   Eosinophils Relative 0 %   Eosinophils Absolute 0.0 0.0 - 0.5 K/uL   Basophils Relative 0 %   Basophils Absolute 0.0 0.0 - 0.1 K/uL   Immature Granulocytes 1 %   Abs Immature Granulocytes 0.06 0.00 - 0.07 K/uL  Urinalysis, Routine w reflex microscopic Urine, Clean Catch  Result Value Ref Range   Color, Urine YELLOW YELLOW   APPearance CLEAR CLEAR   Specific Gravity, Urine 1.008 1.005 - 1.030   pH 7.0 5.0 - 8.0   Glucose, UA 50 (A) NEGATIVE mg/dL   Hgb urine dipstick NEGATIVE NEGATIVE   Bilirubin Urine NEGATIVE NEGATIVE   Ketones, ur NEGATIVE NEGATIVE mg/dL   Protein, ur NEGATIVE NEGATIVE mg/dL   Nitrite NEGATIVE NEGATIVE   Leukocytes,Ua NEGATIVE NEGATIVE  CK  Result Value Ref Range   Total CK 96 49 - 397 U/L  Vitamin B12  Result Value Ref Range   Vitamin B-12 1,081 (H) 180 - 914 pg/mL  POC SARS Coronavirus 2 Ag-ED - Nasal Swab  Result Value Ref Range   SARS Coronavirus 2 Ag POSITIVE (A) NEGATIVE   CT Head Wo Contrast  Result Date:  02/02/2020 CLINICAL DATA:  Dizziness, nonspecific, lower extremity weakness, tingling. Additional history provided: Patient reports frequent falls over the past few days, feeling unsteady, headache. a EXAM: CT HEAD WITHOUT CONTRAST TECHNIQUE: Contiguous axial images were obtained from the base of the skull through the vertex without intravenous contrast. COMPARISON:  No pertinent prior exams available for comparison. FINDINGS: Brain: Cerebral volume is normal. There is no acute intracranial hemorrhage. No demarcated cortical infarct. No extra-axial fluid collection. No evidence of intracranial mass. No midline shift. Partially empty sella turcica. Vascular: No  hyperdense vessel.  Atherosclerotic calcifications. Skull: Visualized orbits show no acute finding. Sinuses/Orbits: Visualized orbits show no acute finding. Trace bilateral ethmoid and maxillary sinus mucosal thickening. Small bilateral maxillary sinus mucous retention cysts. IMPRESSION: No evidence of acute intracranial abnormality. Mild paranasal sinus disease as described. Electronically Signed   By: Kellie Simmering DO   On: 02/02/2020 08:39   MR BRAIN W WO CONTRAST  Result Date: 02/02/2020 CLINICAL DATA:  Acute stroke. Dizziness. Lower extremity weakness. Frequent falling. EXAM: MRI HEAD WITHOUT AND WITH CONTRAST TECHNIQUE: Multiplanar, multiecho pulse sequences of the brain and surrounding structures were obtained without and with intravenous contrast. CONTRAST:  5mL GADAVIST GADOBUTROL 1 MMOL/ML IV SOLN COMPARISON:  Head CT same day FINDINGS: Brain: The brain has a normal appearance without evidence of malformation, atrophy, old or acute small or large vessel infarction, mass lesion, hemorrhage, hydrocephalus or extra-axial collection. After contrast administration, no abnormal enhancement occurs. Vascular: Major vessels at the base of the brain show flow. Venous sinuses appear patent. Skull and upper cervical spine: Normal. Sinuses/Orbits: Clear/normal. Other: None significant. IMPRESSION: Normal examination. No abnormality seen to explain the clinical presentation. Electronically Signed   By: Nelson Chimes M.D.   On: 02/02/2020 17:44   MR CERVICAL SPINE W WO CONTRAST  Result Date: 02/02/2020 CLINICAL DATA:  Acute progressive myelopathy. EXAM: MRI CERVICAL SPINE WITHOUT AND WITH CONTRAST TECHNIQUE: Multiplanar and multiecho pulse sequences of the cervical spine, to include the craniocervical junction and cervicothoracic junction, were obtained without and with intravenous contrast. CONTRAST:  15mL GADAVIST GADOBUTROL 1 MMOL/ML IV SOLN, 60mL GADAVIST GADOBUTROL 1 MMOL/ML IV SOLN COMPARISON:  None.  FINDINGS: Alignment: Normal Vertebrae: Normal Cord: Normal. No cord compression. No focal cord lesion. The study does suffer from some motion degradation but no abnormality is suspected or documented. No abnormal contrast enhancement. Posterior Fossa, vertebral arteries, paraspinal tissues: Negative Disc levels: Normal from the foramen magnum through C3-4. C4-5: Mild uncovertebral hypertrophy.  No compressive stenosis. C5-6: Uncovertebral hypertrophy right more than left. Right foraminal narrowing that could affect the right C6 nerve. C6-7: Disc bulge. No compressive narrowing of the canal or foramina. C7-T1: Minimal uncovertebral prominence.  No significant stenosis. IMPRESSION: 1. No focal cord lesion. 2. Degenerative spondylosis as outlined above. Right foraminal narrowing at C5-6 that could affect the right C6 nerve. No other compressive canal or foraminal stenosis. Electronically Signed   By: Nelson Chimes M.D.   On: 02/02/2020 17:47   MR THORACIC SPINE W WO CONTRAST  Result Date: 02/02/2020 CLINICAL DATA:  Acute or progressive myelopathy. EXAM: MRI THORACIC WITHOUT AND WITH CONTRAST TECHNIQUE: Multiplanar and multiecho pulse sequences of the thoracic spine were obtained without and with intravenous contrast. CONTRAST:  65mL GADAVIST GADOBUTROL 1 MMOL/ML IV SOLN COMPARISON:  None. FINDINGS: Alignment:  Normal Vertebrae: Normal.  No fracture or primary lesion. Cord: No cord compression or primary cord lesion. No abnormal T2 signal or contrast enhancement. Paraspinal and other soft tissues: Negative Disc  levels: No significant disc level pathology. No disc herniation. No compressive stenosis of the canal. No significant facet arthropathy. No significant foraminal stenosis. Some degenerative T2 signal in the lower thoracic discs, but without evidence of enhancement or endplate changes to suggest infection. IMPRESSION: Negative examination. No evidence of cord compression or primary cord lesion. No compressive  canal or foraminal stenosis. No evidence of inflammatory arthropathy. Some degenerative T2 signal in the lower thoracic discs, but without evidence of enhancement or endplate changes to suggest infection. Electronically Signed   By: Nelson Chimes M.D.   On: 02/02/2020 17:49   DG Chest Port 1 View  Result Date: 02/02/2020 CLINICAL DATA:  Cough. Denies shortness of breath or chest pain. History of diabetes hypertension EXAM: PORTABLE CHEST 1 VIEW COMPARISON:  None. FINDINGS: The heart size and mediastinal contours are within normal limits. Both lungs are clear. The visualized skeletal structures are unremarkable. IMPRESSION: No active disease. Electronically Signed   By: Dahlia Bailiff MD   On: 02/02/2020 08:16    EKG None  Radiology CT Head Wo Contrast  Result Date: 02/02/2020 CLINICAL DATA:  Dizziness, nonspecific, lower extremity weakness, tingling. Additional history provided: Patient reports frequent falls over the past few days, feeling unsteady, headache. a EXAM: CT HEAD WITHOUT CONTRAST TECHNIQUE: Contiguous axial images were obtained from the base of the skull through the vertex without intravenous contrast. COMPARISON:  No pertinent prior exams available for comparison. FINDINGS: Brain: Cerebral volume is normal. There is no acute intracranial hemorrhage. No demarcated cortical infarct. No extra-axial fluid collection. No evidence of intracranial mass. No midline shift. Partially empty sella turcica. Vascular: No hyperdense vessel.  Atherosclerotic calcifications. Skull: Visualized orbits show no acute finding. Sinuses/Orbits: Visualized orbits show no acute finding. Trace bilateral ethmoid and maxillary sinus mucosal thickening. Small bilateral maxillary sinus mucous retention cysts. IMPRESSION: No evidence of acute intracranial abnormality. Mild paranasal sinus disease as described. Electronically Signed   By: Kellie Simmering DO   On: 02/02/2020 08:39   MR BRAIN W WO CONTRAST  Result Date:  02/02/2020 CLINICAL DATA:  Acute stroke. Dizziness. Lower extremity weakness. Frequent falling. EXAM: MRI HEAD WITHOUT AND WITH CONTRAST TECHNIQUE: Multiplanar, multiecho pulse sequences of the brain and surrounding structures were obtained without and with intravenous contrast. CONTRAST:  56mL GADAVIST GADOBUTROL 1 MMOL/ML IV SOLN COMPARISON:  Head CT same day FINDINGS: Brain: The brain has a normal appearance without evidence of malformation, atrophy, old or acute small or large vessel infarction, mass lesion, hemorrhage, hydrocephalus or extra-axial collection. After contrast administration, no abnormal enhancement occurs. Vascular: Major vessels at the base of the brain show flow. Venous sinuses appear patent. Skull and upper cervical spine: Normal. Sinuses/Orbits: Clear/normal. Other: None significant. IMPRESSION: Normal examination. No abnormality seen to explain the clinical presentation. Electronically Signed   By: Nelson Chimes M.D.   On: 02/02/2020 17:44   MR CERVICAL SPINE W WO CONTRAST  Result Date: 02/02/2020 CLINICAL DATA:  Acute progressive myelopathy. EXAM: MRI CERVICAL SPINE WITHOUT AND WITH CONTRAST TECHNIQUE: Multiplanar and multiecho pulse sequences of the cervical spine, to include the craniocervical junction and cervicothoracic junction, were obtained without and with intravenous contrast. CONTRAST:  45mL GADAVIST GADOBUTROL 1 MMOL/ML IV SOLN, 42mL GADAVIST GADOBUTROL 1 MMOL/ML IV SOLN COMPARISON:  None. FINDINGS: Alignment: Normal Vertebrae: Normal Cord: Normal. No cord compression. No focal cord lesion. The study does suffer from some motion degradation but no abnormality is suspected or documented. No abnormal contrast enhancement. Posterior Fossa, vertebral arteries, paraspinal tissues: Negative  Disc levels: Normal from the foramen magnum through C3-4. C4-5: Mild uncovertebral hypertrophy.  No compressive stenosis. C5-6: Uncovertebral hypertrophy right more than left. Right foraminal  narrowing that could affect the right C6 nerve. C6-7: Disc bulge. No compressive narrowing of the canal or foramina. C7-T1: Minimal uncovertebral prominence.  No significant stenosis. IMPRESSION: 1. No focal cord lesion. 2. Degenerative spondylosis as outlined above. Right foraminal narrowing at C5-6 that could affect the right C6 nerve. No other compressive canal or foraminal stenosis. Electronically Signed   By: Nelson Chimes M.D.   On: 02/02/2020 17:47   MR THORACIC SPINE W WO CONTRAST  Result Date: 02/02/2020 CLINICAL DATA:  Acute or progressive myelopathy. EXAM: MRI THORACIC WITHOUT AND WITH CONTRAST TECHNIQUE: Multiplanar and multiecho pulse sequences of the thoracic spine were obtained without and with intravenous contrast. CONTRAST:  63mL GADAVIST GADOBUTROL 1 MMOL/ML IV SOLN COMPARISON:  None. FINDINGS: Alignment:  Normal Vertebrae: Normal.  No fracture or primary lesion. Cord: No cord compression or primary cord lesion. No abnormal T2 signal or contrast enhancement. Paraspinal and other soft tissues: Negative Disc levels: No significant disc level pathology. No disc herniation. No compressive stenosis of the canal. No significant facet arthropathy. No significant foraminal stenosis. Some degenerative T2 signal in the lower thoracic discs, but without evidence of enhancement or endplate changes to suggest infection. IMPRESSION: Negative examination. No evidence of cord compression or primary cord lesion. No compressive canal or foraminal stenosis. No evidence of inflammatory arthropathy. Some degenerative T2 signal in the lower thoracic discs, but without evidence of enhancement or endplate changes to suggest infection. Electronically Signed   By: Nelson Chimes M.D.   On: 02/02/2020 17:49   DG Chest Port 1 View  Result Date: 02/02/2020 CLINICAL DATA:  Cough. Denies shortness of breath or chest pain. History of diabetes hypertension EXAM: PORTABLE CHEST 1 VIEW COMPARISON:  None. FINDINGS: The heart  size and mediastinal contours are within normal limits. Both lungs are clear. The visualized skeletal structures are unremarkable. IMPRESSION: No active disease. Electronically Signed   By: Dahlia Bailiff MD   On: 02/02/2020 08:16    Procedures Procedures   Medications Ordered in ED Medications - No data to display  ED Course  I have reviewed the triage vital signs and the nursing notes.  Pertinent labs & imaging results that were available during my care of the patient were reviewed by me and considered in my medical decision making (see chart for details).    MDM Rules/Calculators/A&P                         Iv ns. Pt with very recent labs and imaging studies - reviewed. Feel no added value in repeating now.   Reviewed nursing notes and prior charts for additional history.  Recent CT and MRI brain and C/T spine negative for acute pathology.   Labs reviewed/intepreted by me from 1/24 - wbc normal. K sl low.   From yesterday's workup, no LP, ?possible GBS - will get LP.  Neurology also consulted - discussed pt with Dr Curly Shores who was familiar with patient and had evaluated yesterday - she agrees w LP, will add any additional tests/analysis she needs, and will consult on patient.   ED LP attempt not successful. Sterile technique, sterile prep and drap, skin anesthetized, LP needle inserted, no csf returned. Pt tolerated attempt well. No unusual pain or discomfort, no radicular pain, no numbness/tingling or weakness during attempt. Sterile dressing.  Will obtain LP via fluoro/radiology.   1525, LP/labs pending. Signed out to Dr Roslynn Amble to check labs when resulted, and consult hospitalists for admission re GBS vs covid myopathy vs other neurologic process.        Final Clinical Impression(s) / ED Diagnoses Final diagnoses:  None    Rx / DC Orders ED Discharge Orders    None       Lajean Saver, MD 02/03/20 [phone]

## 2020-02-03 NOTE — ED Triage Notes (Signed)
Pt arrives from ED with c/o fall last night around 8pm after d/c from [facility] ED. Pt reports continued and worsening numbness and tingling to bilateral hands and feet. Pt states that he has become progressively weak and lethargic since Sunday 1/23 to the point where he cannot walk or eat. Pt unable to feed self this morning. Pt reports recently retiring and always working outside. Was dx with COVID on 1/24, has had COVID last year. Currently A&O x4 with c/o of mild generalized back/neck pain.

## 2020-02-03 NOTE — H&P (Signed)
History and Physical    Bonner Larue MKL:[id number] DOB: 07-20-1955 DOA: 02/03/2020  PCP: [name], DO  Patient coming from: Home.  Chief Complaint: Tingling and numbness of the extremities with weakness.  HPI: Jon Townsend is a 65 y.o. male with history of hypertension, hyperlipidemia, diabetes mellitus type 2 who has had Covid infection in March 2021 was given antibodies following which symptoms resolved has been having some upper respiratory tract symptoms last month for which patient received prednisone antibiotics and also last week for which again patient received prednisone.  Patient started experiencing tingling and numbness of the upper extremities on Sunday February 01, 2020 which slowly progressed proximally and also started involving the lower extremities.  At that point patient came to the ER was evaluated by neurologist and outpatient further work-up was recommended.  But since patient's symptoms got more worse with weakness of the lower extremities patient decided to come to the ER.  Patient also has been having low back pain denies any fever chills or any incontinence of urine or bowel.  ED Course: In the ER patient was recommended to get a LP by the neurologist but ER physician attempted and was unable to obtain an radiology under fluoroscopy also attempted was unable to obtain.  Neurologist again evaluated and felt that patient symptoms were consistent with possible Guillain-Barr syndrome for which IVIG was ordered and admitted for further management.  Patient again turned out to be positive for Covid but asymptomatic at this time.  Review of Systems: As per HPI, rest all negative.   Past Medical History:  Diagnosis Date  . Allergy   . BPH (benign prostatic hyperplasia)   . Diabetes mellitus type 2 in obese ([place])   . Glaucoma   . Hypertension     Past Surgical History:  Procedure Laterality Date  . Broken finger    . TONSILLECTOMY       reports  that he has quit smoking. He has never used smokeless tobacco. He reports that he does not drink alcohol and does not use drugs.  No Known Allergies  Family History  Problem Relation Age of Onset  . Cancer Father   . Early death Father   . Diabetes Sister   . Heart disease Sister   . COPD Sister   . Diabetes Sister   . Colon cancer Neg Hx   . Esophageal cancer Neg Hx   . Stomach cancer Neg Hx   . Rectal cancer Neg Hx     Prior to Admission medications   Medication Sig Start Date End Date Taking? Authorizing Provider  amLODipine (NORVASC) 5 MG tablet Take 1 tablet (5 mg total) by mouth daily. 09/08/19  Yes [name], DO  atorvastatin (LIPITOR) 40 MG tablet Take 1 tablet (40 mg total) by mouth daily. Patient taking differently: Take 40 mg by mouth at bedtime. 11/07/19  Yes [name], DO  Coenzyme Q10 (COQ10) 100 MG CAPS Take 100 mg by mouth in the morning.   Yes [provider]  Ergocalciferol (VITAMIN D2) 50 MCG (2000 UT) TABS Take 2,000 mg by mouth daily.   Yes [provider]  fexofenadine (ALLEGRA) 180 MG tablet Take 180 mg by mouth in the morning.   Yes [provider]  ibuprofen (ADVIL) 200 MG tablet Take 400 mg by mouth every 6 (six) hours as needed for mild pain (or headaches).   Yes [provider]  Javier Docker Oil 1000 MG CAPS Take 1,000 mg by  mouth daily.   Yes [provider]  meloxicam (MOBIC) 7.5 MG tablet Take 1 tablet (7.5 mg total) by mouth 2 (two) times daily as needed for pain. 12/10/19  Yes Leandrew Koyanagi, MD  metFORMIN (GLUCOPHAGE-XR) 500 MG 24 hr tablet Take 2 tablets (1,000 mg total) by mouth daily with breakfast. 09/08/19  Yes Wendling, Crosby Oyster, DO  metoprolol tartrate (LOPRESSOR) 50 MG tablet Take 1 tablet (50 mg total) by mouth 2 (two) times daily. Patient taking differently: Take 50 mg by mouth in the morning and at bedtime. 11/07/19  Yes [name], DO  tamsulosin (FLOMAX) 0.4  MG CAPS capsule Take 1 capsule (0.4 mg total) by mouth daily. 10/14/19  Yes [name], DO  vitamin C (ASCORBIC ACID) 500 MG tablet Take 500-1,000 mg by mouth daily.   Yes [provider]  Blood Glucose Monitoring Suppl (ONETOUCH VERIO) w/Device KIT Use once daily to check blood sugar 01/23/18   Wendling, Crosby Oyster, DO  glucose blood (ONETOUCH VERIO) test strip Use as once daily to check blood sugar. 01/23/18   [name], DO  Lancets [facility] ULTRASOFT) lancets Use daily to check blood sugar. 01/23/18   [name], DO    Physical Exam: Constitutional: Moderately built and nourished. Vitals:   02/03/20 1900 02/03/20 1945 02/03/20 2000 02/03/20 2120  BP: 139/76  (!) 151/85 (!) 150/95  Pulse: 79 80 76 80  Resp: 19 15 (!) 21 18  Temp:    98.3 F (36.8 C)  TempSrc:    Oral  SpO2: 95% 95% 98% 99%  Weight:      Height:       Eyes: Anicteric no pallor. ENMT: No discharge from the ears eyes nose or mouth. Neck: No mass felt.  No neck rigidity. Respiratory: No rhonchi or crepitations. Cardiovascular: S1-S2 heard. Abdomen: Soft nontender bowel sounds present. Musculoskeletal: No edema. Skin: No rash. Neurologic: Alert awake oriented to time place and person.  Moving all extremities with lower extremities around 4 x 5 in strength upper extremity 5 x 5 in strength.  Poor deep tendon reflex of the lower extremities. Psychiatric: Appears normal.  Normal affect.   Labs on Admission: I have personally reviewed following labs and imaging studies  CBC: Recent Labs  Lab 02/02/20 0800 02/03/20 1734  WBC 10.4 10.1  NEUTROABS 7.9* 7.7  HGB 16.3 16.3  HCT 47.4 50.6  MCV 91.3 95.5  PLT 200 098   Basic Metabolic Panel: Recent Labs  Lab 02/02/20 0800 02/03/20 1738  NA 139 134*  K 3.3* 4.3  CL 101 101  CO2 27 21*  GLUCOSE 152* 172*  BUN 16 27*  CREATININE 0.95 0.79  CALCIUM 9.0 8.5*   GFR: Estimated Creatinine Clearance: 119.9 mL/min  (by C-G formula based on SCr of 0.79 mg/dL). Liver Function Tests: Recent Labs  Lab 02/02/20 0800 02/03/20 1738  AST 15 28  ALT 19 16  ALKPHOS 77 74  BILITOT 1.5* 1.7*  PROT 6.6 6.0*  ALBUMIN 3.5 3.3*   No results for input(s): LIPASE, AMYLASE in the last 168 hours. No results for input(s): AMMONIA in the last 168 hours. Coagulation Profile: No results for input(s): INR, PROTIME in the last 168 hours. Cardiac Enzymes: Recent Labs  Lab 02/02/20 1556 02/03/20 1738  CKTOTAL 96 216   BNP (last 3 results) No results for input(s): PROBNP in the last 8760 hours. HbA1C: Recent Labs    02/03/20 1734  HGBA1C 7.7*   CBG: Recent  Labs  Lab 02/03/20 2128  GLUCAP 145*   Lipid Profile: No results for input(s): CHOL, HDL, LDLCALC, TRIG, CHOLHDL, LDLDIRECT in the last 72 hours. Thyroid Function Tests: No results for input(s): TSH, T4TOTAL, FREET4, T3FREE, THYROIDAB in the last 72 hours. Anemia Panel: Recent Labs    02/02/20 1556  VITAMINB12 1,081*   Urine analysis:    Component Value Date/Time   COLORURINE YELLOW 02/02/2020 0748   APPEARANCEUR CLEAR 02/02/2020 0748   LABSPEC 1.008 02/02/2020 0748   PHURINE 7.0 02/02/2020 0748   GLUCOSEU 50 (A) 02/02/2020 0748   HGBUR NEGATIVE 02/02/2020 0748   BILIRUBINUR NEGATIVE 02/02/2020 0748   KETONESUR NEGATIVE 02/02/2020 0748   PROTEINUR NEGATIVE 02/02/2020 0748   NITRITE NEGATIVE 02/02/2020 0748   LEUKOCYTESUR NEGATIVE 02/02/2020 0748   Sepsis Labs: _0 (procalcitonin:4,lacticidven:4) )No results found for this or any previous visit (from the past 240 hour(s)).   Radiological Exams on Admission: CT Head Wo Contrast  Result Date: 02/02/2020 CLINICAL DATA:  Dizziness, nonspecific, lower extremity weakness, tingling. Additional history provided: Patient reports frequent falls over the past few days, feeling unsteady, headache. a EXAM: CT HEAD WITHOUT CONTRAST TECHNIQUE: Contiguous axial images were obtained from the base  of the skull through the vertex without intravenous contrast. COMPARISON:  No pertinent prior exams available for comparison. FINDINGS: Brain: Cerebral volume is normal. There is no acute intracranial hemorrhage. No demarcated cortical infarct. No extra-axial fluid collection. No evidence of intracranial mass. No midline shift. Partially empty sella turcica. Vascular: No hyperdense vessel.  Atherosclerotic calcifications. Skull: Visualized orbits show no acute finding. Sinuses/Orbits: Visualized orbits show no acute finding. Trace bilateral ethmoid and maxillary sinus mucosal thickening. Small bilateral maxillary sinus mucous retention cysts. IMPRESSION: No evidence of acute intracranial abnormality. Mild paranasal sinus disease as described. Electronically Signed   By: Kellie Simmering DO   On: 02/02/2020 08:39   MR BRAIN W WO CONTRAST  Result Date: 02/02/2020 CLINICAL DATA:  Acute stroke. Dizziness. Lower extremity weakness. Frequent falling. EXAM: MRI HEAD WITHOUT AND WITH CONTRAST TECHNIQUE: Multiplanar, multiecho pulse sequences of the brain and surrounding structures were obtained without and with intravenous contrast. CONTRAST:  98m GADAVIST GADOBUTROL 1 MMOL/ML IV SOLN COMPARISON:  Head CT same day FINDINGS: Brain: The brain has a normal appearance without evidence of malformation, atrophy, old or acute small or large vessel infarction, mass lesion, hemorrhage, hydrocephalus or extra-axial collection. After contrast administration, no abnormal enhancement occurs. Vascular: Major vessels at the base of the brain show flow. Venous sinuses appear patent. Skull and upper cervical spine: Normal. Sinuses/Orbits: Clear/normal. Other: None significant. IMPRESSION: Normal examination. No abnormality seen to explain the clinical presentation. Electronically Signed   By: MNelson ChimesM.D.   On: 02/02/2020 17:44   MR CERVICAL SPINE W WO CONTRAST  Result Date: 02/02/2020 CLINICAL DATA:  Acute progressive myelopathy.  EXAM: MRI CERVICAL SPINE WITHOUT AND WITH CONTRAST TECHNIQUE: Multiplanar and multiecho pulse sequences of the cervical spine, to include the craniocervical junction and cervicothoracic junction, were obtained without and with intravenous contrast. CONTRAST:  19mGADAVIST GADOBUTROL 1 MMOL/ML IV SOLN, 102mADAVIST GADOBUTROL 1 MMOL/ML IV SOLN COMPARISON:  None. FINDINGS: Alignment: Normal Vertebrae: Normal Cord: Normal. No cord compression. No focal cord lesion. The study does suffer from some motion degradation but no abnormality is suspected or documented. No abnormal contrast enhancement. Posterior Fossa, vertebral arteries, paraspinal tissues: Negative Disc levels: Normal from the foramen magnum through C3-4. C4-5: Mild uncovertebral hypertrophy.  No compressive stenosis. C5-6: Uncovertebral hypertrophy right more  than left. Right foraminal narrowing that could affect the right C6 nerve. C6-7: Disc bulge. No compressive narrowing of the canal or foramina. C7-T1: Minimal uncovertebral prominence.  No significant stenosis. IMPRESSION: 1. No focal cord lesion. 2. Degenerative spondylosis as outlined above. Right foraminal narrowing at C5-6 that could affect the right C6 nerve. No other compressive canal or foraminal stenosis. Electronically Signed   By: Nelson Chimes M.D.   On: 02/02/2020 17:47   MR THORACIC SPINE W WO CONTRAST  Result Date: 02/02/2020 CLINICAL DATA:  Acute or progressive myelopathy. EXAM: MRI THORACIC WITHOUT AND WITH CONTRAST TECHNIQUE: Multiplanar and multiecho pulse sequences of the thoracic spine were obtained without and with intravenous contrast. CONTRAST:  74m GADAVIST GADOBUTROL 1 MMOL/ML IV SOLN COMPARISON:  None. FINDINGS: Alignment:  Normal Vertebrae: Normal.  No fracture or primary lesion. Cord: No cord compression or primary cord lesion. No abnormal T2 signal or contrast enhancement. Paraspinal and other soft tissues: Negative Disc levels: No significant disc level pathology. No  disc herniation. No compressive stenosis of the canal. No significant facet arthropathy. No significant foraminal stenosis. Some degenerative T2 signal in the lower thoracic discs, but without evidence of enhancement or endplate changes to suggest infection. IMPRESSION: Negative examination. No evidence of cord compression or primary cord lesion. No compressive canal or foraminal stenosis. No evidence of inflammatory arthropathy. Some degenerative T2 signal in the lower thoracic discs, but without evidence of enhancement or endplate changes to suggest infection. Electronically Signed   By: MNelson ChimesM.D.   On: 02/02/2020 17:49   DG Fluoro Rm 1-60 Min  Result Date: 02/03/2020 CLINICAL DATA:  Weakness, leg weakness and numbness in the lower extremities and hands. History of COVID infection in this 65year old male EXAM: DIAGNOSTIC LUMBAR PUNCTURE UNDER FLUOROSCOPIC GUIDANCE FLUOROSCOPY TIME:  Fluoroscopy Time:  1 minutes 20 seconds Radiation Exposure Index (if provided by the fluoroscopic device): 11.6 mGy Number of Acquired Spot Images: 1 PROCEDURE: Informed consent was obtained from the patient prior to the procedure, including potential complications of headache, allergy, and pain. With the patient prone, the lower back was prepped with Betadine. 1% Lidocaine was used for local anesthesia. Lumbar puncture was attempted at the L4-5, L3-4 and L2-3 levels level using a 20, 5 inch gauge needle. Access was unsuccessful despite multiple attempts by 2 different radiologists. Attempts were made in both the oblique and prone position. Saved images show a needle adjacent to the L3-4 level. This was for leveling purposes only. Attempts were made to access the lucent area adjacent to spinous processes at this level on both sides of the spinous process, at the level below and the level above without success. Lateral image saved due to difficulty in accessing the spinal canal with 5 inch needle shows that with  approximately 1-2 inches outside of the skin there was still a distance to travel to access the canal. Final set of images shows no significant change with pillow in place under the abdomen in the appearance of the spine. IMPRESSION: 1. Unsuccessful lumbar puncture attempt at multiple levels as outlined above. 2. With suggest MRI of the lumbar spine for further evaluation to assess both the nature of degenerative changes and the position of the conus for further attempts, potentially in a biplane room which would aid in needle localization and allow for positioning that might improve access to the spinal canal. These results were called by telephone at the time of interpretation on 02/03/2020 at 5:22 pm to provider Dr. DRoslynn Amble who  verbally acknowledged these results. Electronically Signed   By: Zetta Bills M.D.   On: 02/03/2020 17:59   DG Chest Port 1 View  Result Date: 02/02/2020 CLINICAL DATA:  Cough. Denies shortness of breath or chest pain. History of diabetes hypertension EXAM: PORTABLE CHEST 1 VIEW COMPARISON:  None. FINDINGS: The heart size and mediastinal contours are within normal limits. Both lungs are clear. The visualized skeletal structures are unremarkable. IMPRESSION: No active disease. Electronically Signed   By: Dahlia Bailiff MD   On: 02/02/2020 08:16    EKG: Independently reviewed.  Normal sinus rhythm.  Assessment/Plan Principal Problem:   Polyneuropathy Active Problems:   Diabetes mellitus type 2 in obese ([facility])   Weakness   Essential hypertension   COVID-19 virus infection    1. Guillain-Barr syndrome/polyneuropathy -discussed with on-call neurologist Dr. Cheral Marker symptoms are concerning for GBS for which patient was started on IVIG by the neurologist.  Pending lumbar puncture and also MRI L-spine.  Closely follow respiratory status check NIF and vital capacity. 2. Low back pain MRI L-spine is pending.  As needed IV fentanyl ordered as recommended by neurologist. 3. Covid  infection positive but patient is asymptomatic.  Will follow inflammatory markers and respiratory status chest x-ray is pending. 4. Diabetes mellitus type 2 we will keep patient on pain scale coverage. 5. Hypertension on amlodipine and beta-blockers. 6. Hyperlipidemia on statins. 7. BPH on Flomax.  Since patient has possible Guillain-Barr syndrome will need close monitoring and inpatient status.   DVT prophylaxis: SCDs.  Avoiding anticoagulation in anticipation of lumbar puncture. Code Status: Full code. Family Communication: Discussed with patient. Disposition Plan: Home. Consults called: Neurologist. Admission status: Inpatient.   Rise Patience MD [facility] Pager [phone].  If 7PM-7AM, please contact night-coverage [email or web address] Password [email or web address]  02/03/2020, 9:30 PM

## 2020-02-03 NOTE — ED Notes (Signed)
Pt transferred to Fluoro

## 2020-02-03 NOTE — Progress Notes (Signed)
NIF = >-40cmH2o on 3 good attempts VC= 2.0L/ 1.8L / 1.6L all with good effort

## 2020-02-03 NOTE — Telephone Encounter (Signed)
Called to discuss with patient about COVID-19 symptoms and the use of one of the available treatments for those with mild to moderate Covid symptoms and at a high risk of hospitalization.  Pt appears to qualify for outpatient treatment due to co-morbid conditions and/or a member of an at-risk group in accordance with the FDA Emergency Use Authorization.    Symptom onset: 01/30/20 Vaccinated: Unknown Booster? Unknown Immunocompromised? No Qualifiers: Yes  Unable to reach pt - Reached pt. Spoke with wife, reports pt. Is unable to get up and walk. They are taking him back to the ED for evaluation.Will ask ED about further treatment for COVID 19. Jon Townsend

## 2020-02-04 ENCOUNTER — Inpatient Hospital Stay (HOSPITAL_COMMUNITY): Payer: Medicare Other

## 2020-02-04 DIAGNOSIS — E669 Obesity, unspecified: Secondary | ICD-10-CM | POA: Diagnosis not present

## 2020-02-04 DIAGNOSIS — E1169 Type 2 diabetes mellitus with other specified complication: Secondary | ICD-10-CM | POA: Diagnosis not present

## 2020-02-04 DIAGNOSIS — I10 Essential (primary) hypertension: Secondary | ICD-10-CM

## 2020-02-04 DIAGNOSIS — G629 Polyneuropathy, unspecified: Secondary | ICD-10-CM | POA: Diagnosis not present

## 2020-02-04 DIAGNOSIS — U071 COVID-19: Secondary | ICD-10-CM | POA: Diagnosis not present

## 2020-02-04 LAB — CBC WITH DIFFERENTIAL/PLATELET
Abs Immature Granulocytes: 0.06 10*3/uL (ref 0.00–0.07)
Basophils Absolute: 0 10*3/uL (ref 0.0–0.1)
Basophils Relative: 0 %
Eosinophils Absolute: 0 10*3/uL (ref 0.0–0.5)
Eosinophils Relative: 0 %
HCT: 46.4 % (ref 39.0–52.0)
Hemoglobin: 16.2 g/dL (ref 13.0–17.0)
Immature Granulocytes: 1 %
Lymphocytes Relative: 8 %
Lymphs Abs: 0.8 10*3/uL (ref 0.7–4.0)
MCH: 31.4 pg (ref 26.0–34.0)
MCHC: 34.9 g/dL (ref 30.0–36.0)
MCV: 89.9 fL (ref 80.0–100.0)
Monocytes Absolute: 1 10*3/uL (ref 0.1–1.0)
Monocytes Relative: 9 %
Neutro Abs: 8.9 10*3/uL — ABNORMAL HIGH (ref 1.7–7.7)
Neutrophils Relative %: 82 %
Platelets: 208 10*3/uL (ref 150–400)
RBC: 5.16 MIL/uL (ref 4.22–5.81)
RDW: 12.4 % (ref 11.5–15.5)
WBC: 10.8 10*3/uL — ABNORMAL HIGH (ref 4.0–10.5)
nRBC: 0 % (ref 0.0–0.2)

## 2020-02-04 LAB — GLUCOSE, CAPILLARY
Glucose-Capillary: 161 mg/dL — ABNORMAL HIGH (ref 70–99)
Glucose-Capillary: 155 mg/dL — ABNORMAL HIGH (ref 70–99)
Glucose-Capillary: 229 mg/dL — ABNORMAL HIGH (ref 70–99)
Glucose-Capillary: 187 mg/dL — ABNORMAL HIGH (ref 70–99)

## 2020-02-04 LAB — D-DIMER, QUANTITATIVE: D-Dimer, Quant: 0.74 ug/mL-FEU — ABNORMAL HIGH (ref 0.00–0.50)

## 2020-02-04 LAB — COMPREHENSIVE METABOLIC PANEL
ALT: 21 U/L (ref 0–44)
AST: 21 U/L (ref 15–41)
Albumin: 3.3 g/dL — ABNORMAL LOW (ref 3.5–5.0)
Alkaline Phosphatase: 72 U/L (ref 38–126)
Anion gap: 10 (ref 5–15)
BUN: 24 mg/dL — ABNORMAL HIGH (ref 8–23)
CO2: 26 mmol/L (ref 22–32)
Calcium: 8.3 mg/dL — ABNORMAL LOW (ref 8.9–10.3)
Chloride: 97 mmol/L — ABNORMAL LOW (ref 98–111)
Creatinine, Ser: 0.85 mg/dL (ref 0.61–1.24)
GFR, Estimated: >60 mL/min (ref >60)
Glucose, Bld: 162 mg/dL — ABNORMAL HIGH (ref 70–99)
Potassium: 4 mmol/L (ref 3.5–5.1)
Sodium: 133 mmol/L — ABNORMAL LOW (ref 135–145)
Total Bilirubin: 1.5 mg/dL — ABNORMAL HIGH (ref 0.3–1.2)
Total Protein: 6.3 g/dL — ABNORMAL LOW (ref 6.5–8.1)

## 2020-02-04 LAB — ZINC: Zinc: 72 ug/dL (ref 44–115)

## 2020-02-04 LAB — C-REACTIVE PROTEIN: CRP: 1.6 mg/dL — ABNORMAL HIGH (ref <1.0)

## 2020-02-04 LAB — MISC LABCORP TEST (SEND OUT): Labcorp test code: 2004998

## 2020-02-04 LAB — TSH: TSH: 1.12 u[IU]/mL (ref 0.350–4.500)

## 2020-02-04 LAB — COPPER, SERUM: Copper: 87 ug/dL (ref 69–132)

## 2020-02-04 MED ORDER — KETOROLAC TROMETHAMINE 15 MG/ML IJ SOLN
15.0000 mg | Freq: Four times a day (QID) | INTRAMUSCULAR | Status: AC | PRN
  Administered 2020-02-04 – 2020-02-09 (×7): 15 mg via INTRAVENOUS
  Filled 2020-02-04 (×9): qty 1

## 2020-02-04 MED ORDER — IMMUNE GLOBULIN (HUMAN) 10 GM/100ML IV SOLN: 400.0000 mg/kg | INTRAVENOUS | Status: DC

## 2020-02-04 MED ORDER — GADOBUTROL 1 MMOL/ML IV SOLN
10.0000 mL | Freq: Once | INTRAVENOUS | Status: AC | PRN
  Administered 2020-02-04: 10 mL via INTRAVENOUS

## 2020-02-04 MED ORDER — IMMUNE GLOBULIN (HUMAN) 10 GM/100ML IV SOLN
400.0000 mg/kg | INTRAVENOUS | Status: AC
  Administered 2020-02-04 – 2020-02-07 (×5): 45 g via INTRAVENOUS
  Filled 2020-02-04: qty 400
  Filled 2020-02-04 (×2): qty 50
  Filled 2020-02-04: qty 400
  Filled 2020-02-04: qty 450

## 2020-02-04 MED ORDER — METHOCARBAMOL 500 MG PO TABS
1000.0000 mg | ORAL_TABLET | Freq: Once | ORAL | Status: AC
  Administered 2020-02-04: 1000 mg via ORAL
  Filled 2020-02-04: qty 2

## 2020-02-04 MED ORDER — HYDROMORPHONE HCL 1 MG/ML IJ SOLN
0.5000 mg | Freq: Once | INTRAMUSCULAR | Status: AC
  Administered 2020-02-04: 0.5 mg via INTRAVENOUS
  Filled 2020-02-04: qty 0.5

## 2020-02-04 NOTE — Progress Notes (Signed)
Lab called about a test code needing to be changed, MD notified.

## 2020-02-04 NOTE — Progress Notes (Signed)
Neurology Progress Note  Patient ID: Jon Townsend is a 65 y.o. with PMHx of  has a past medical history of Allergy, BPH (benign prostatic hyperplasia), Diabetes mellitus type 2 in obese ([place]), Glaucoma, and Hypertension.  He presented with a repeat COVID-19 infection, had received steroids outpatient for concern for bronchitis, and he is being treated for presumed GBS given failed attempt at lumbar puncture x3 (twice under fluoroscopy)  Initially consulted for: Weakness  Major interval events:  Started IVIG for presumed GBS NIF -40cmH2O good effort VC 1.4L good effort  Subjective: Reports no headaches.  Overall stable sensory complaints.  Stable weakness  Exam: Vitals:   02/04/20 1237 02/04/20 1549  BP: (!) 148/80 (!) 149/73  Pulse: 68 74  Resp: 20 18  Temp: 98 F (36.7 C) 98 F (36.7 C)  SpO2: 100% 97%   Gen: In bed, comfortable  Resp: non-labored breathing, no grossly audible wheezing Cardiac: Perfusing extremities well  Abd: soft, nt Psychiatric: Affect appropriate, cooperative  Neuro: MS: Awake, alert, oriented to person, place, time, situation CN: External ocular movements intact, pupils equal round reactive to light, face symmetric, tongue midline Motor: Mild bilateral upper extremity weakness 4/5, slightly more pronounced distally than proximally.  Hip flexor weakness 3/5 bilaterally Sensory: Stable complaints of tingling in the hands and feet, today not complaining of any involvement of the forearms. DTR: Trace biceps bilaterally otherwise absent  Pertinent Labs: Mild hyponatremia to 133 Creatinine 0.85 CRP 1.6 D-dimer 0.74 TSH 1.12  Impression: This is a 65 year old gentleman presenting with [id number] infection complicated by likely Guillain-Barr syndrome being treated with IVIG, plan for 5-day course.  We had an extensive discussion on COVID-19 vaccination.  Patient expressed concerns that there are inadequate data at this time to support the safety  vaccination and concerns about cases he has heard about in the media regarding vaccine side effects.  We discussed that he is at risk for repeat infection given that natural immunity has been shown to be less effective than vaccination.  The J&J vaccine has been associated with some cases of GBS but the mRNA vaccines do not have this association.  Should the patient be amenable to vaccination in the future, recommend mRNA vaccine against Covid 19  Recommendations: -Continue IVIG 1/25-1/29  -Neurology will continue to follow   [place] [phone]

## 2020-02-04 NOTE — Plan of Care (Signed)
  Problem: Clinical Measurements: Goal: Diagnostic test results will improve Outcome: Progressing   Problem: Clinical Measurements: Goal: Ability to maintain clinical measurements within normal limits will improve Outcome: Progressing   Problem: Education: Goal: Knowledge of General Education information will improve Description: Including pain rating scale, medication(s)/side effects and non-pharmacologic comfort measures Outcome: Progressing

## 2020-02-04 NOTE — Progress Notes (Signed)
Patient performed NIF and VC with good effort.  NIF -45 VC 1.5L

## 2020-02-04 NOTE — Progress Notes (Signed)
NIF -40cmH2O good effort VC 1.4L good effort

## 2020-02-04 NOTE — Progress Notes (Signed)
PROGRESS NOTE    Jon Townsend  JQB:[id number] DOB: 1955-04-15 DOA: 02/03/2020 PCP: [name], DO    Brief Narrative:  65 y.o. male with history of hypertension, hyperlipidemia, diabetes mellitus type 2 who has had Covid infection in March 2021 was given antibodies following which symptoms resolved has been having some upper respiratory tract symptoms last month for which patient received prednisone antibiotics and also last week for which again patient received prednisone.  Patient started experiencing tingling and numbness of the upper extremities on Sunday February 01, 2020 which slowly progressed proximally and also started involving the lower extremities.  At that point patient came to the ER was evaluated by neurologist and outpatient further work-up was recommended.  But since patient's symptoms got more worse with weakness of the lower extremities patient decided to come to the ER.  Patient also has been having low back pain denies any fever chills or any incontinence of urine or bowel.  Assessment & Plan:   Principal Problem:   Polyneuropathy Active Problems:   Diabetes mellitus type 2 in obese (HCC)   Weakness   Essential hypertension   COVID-19 virus infection  1. Guillain-Barr syndrome/polyneuropathy 1. Nephrology following 2. Pt is continued on IVIG per Neurology recs 2. Low back pain  1. MRI L-spine reviewed. Findings consistent with prior LP attempts noted 2. Little to no benefit with fentanyl IV 3. Will give trial of heat pack and toradol 4. Recheck cmp in AM 5. Will consult PT/OT 3. Covid infection positive but patient is asymptomatic.   1. Will follow inflammatory markers 2. CRP this AM 1.6 4. Diabetes mellitus type 2 1.  we will keep patient on pain scale coverage. 5. Hypertension 1. continue on amlodipine and beta-blockers. 6. Hyperlipidemia on statins. 7. BPH on Flomax.  DVT prophylaxis: SCD's Code Status: Full Family Communication: Pt  in room, family not at bedside  Status is: Inpatient  Remains inpatient appropriate because:IV treatments appropriate due to intensity of illness or inability to take PO and Inpatient level of care appropriate due to severity of illness   Dispo: The patient is from: Home              Anticipated d/c is to: Home              Anticipated d/c date is: 3 days              Patient currently is not medically stable to d/c.   Difficult to place patient No    Consultants:   Neurology  Procedures:     Antimicrobials: Anti-infectives (From admission, onward)   None       Subjective: Without complaints this AM  Objective: Vitals:   02/04/20 0422 02/04/20 0753 02/04/20 1237 02/04/20 1549  BP: (!) 160/87 (!) 154/80 (!) 148/80 (!) 149/73  Pulse: 66 71 68 74  Resp: 20 20 20 18   Temp: 98 F (36.7 C) 98.4 F (36.9 C) 98 F (36.7 C) 98 F (36.7 C)  TempSrc: Oral Oral Oral Oral  SpO2: 99% 98% 100% 97%  Weight:      Height:        Intake/Output Summary (Last 24 hours) at 02/04/2020 1851 Last data filed at 02/04/2020 0800 Gross per 24 hour  Intake --  Output 350 ml  Net -350 ml   Filed Weights   02/03/20 1228  Weight: 113.4 kg    Examination:  General exam: Appears calm and comfortable  Respiratory system: No audible wheezing. Respiratory  effort normal. Cardiovascular system: perfused, no notable JVD Gastrointestinal system: Abdomen is nondistended, nontender Central nervous system: Alert and oriented. No focal neurological deficits. Extremities: Symmetric 5 x 5 power. Skin: No rashes, lesions Psychiatry: Judgement and insight appear normal. Mood & affect appropriate.   Data Reviewed: I have personally reviewed following labs and imaging studies  CBC: Recent Labs  Lab 02/02/20 0800 02/03/20 1734 02/04/20 0140  WBC 10.4 10.1 10.8*  NEUTROABS 7.9* 7.7 8.9*  HGB 16.3 16.3 16.2  HCT 47.4 50.6 46.4  MCV 91.3 95.5 89.9  PLT 200 183 [id number]   Basic Metabolic  Panel: Recent Labs  Lab 02/02/20 0800 02/03/20 1738 02/04/20 0140  NA 139 134* 133*  K 3.3* 4.3 4.0  CL 101 101 97*  CO2 27 21* 26  GLUCOSE 152* 172* 162*  BUN 16 27* 24*  CREATININE 0.95 0.79 0.85  CALCIUM 9.0 8.5* 8.3*   GFR: Estimated Creatinine Clearance: 112.8 mL/min (by C-G formula based on SCr of 0.85 mg/dL). Liver Function Tests: Recent Labs  Lab 02/02/20 0800 02/03/20 1738 02/04/20 0140  AST 15 28 21   ALT 19 16 21   ALKPHOS 77 74 72  BILITOT 1.5* 1.7* 1.5*  PROT 6.6 6.0* 6.3*  ALBUMIN 3.5 3.3* 3.3*   No results for input(s): LIPASE, AMYLASE in the last 168 hours. No results for input(s): AMMONIA in the last 168 hours. Coagulation Profile: No results for input(s): INR, PROTIME in the last 168 hours. Cardiac Enzymes: Recent Labs  Lab 02/02/20 1556 02/03/20 1738  CKTOTAL 96 216   BNP (last 3 results) No results for input(s): PROBNP in the last 8760 hours. HbA1C: Recent Labs    02/03/20 1734  HGBA1C 7.7*   CBG: Recent Labs  Lab 02/03/20 2128 02/04/20 0625 02/04/20 1231 02/04/20 1836  GLUCAP 145* 161* 155* 229*   Lipid Profile: No results for input(s): CHOL, HDL, LDLCALC, TRIG, CHOLHDL, LDLDIRECT in the last 72 hours. Thyroid Function Tests: Recent Labs    02/04/20 0140  TSH 1.120   Anemia Panel: Recent Labs    02/02/20 1556  VITAMINB12 1,081*   Sepsis Labs: No results for input(s): PROCALCITON, LATICACIDVEN in the last 168 hours.  No results found for this or any previous visit (from the past 240 hour(s)).   Radiology Studies: MR Lumbar Spine W Wo Contrast  Result Date: 02/04/2020 CLINICAL DATA:  65 year old male with history of worsening tetraparesis and sensory disturbance in the context of repeat COVID infection, concern for Guillain-Barre. Status post multiple recent failed LP attempts. EXAM: MRI LUMBAR SPINE WITHOUT AND WITH CONTRAST TECHNIQUE: Multiplanar and multiecho pulse sequences of the lumbar spine were obtained without  and with intravenous contrast. CONTRAST:  35mL GADAVIST GADOBUTROL 1 MMOL/ML IV SOLN COMPARISON:  Previous radiograph from 04/03/2016. FINDINGS: Segmentation: Standard. Lowest well-formed disc space labeled the L5-S1 level. Alignment: Mild accentuation of the normal lumbar lordosis. Trace stepwise retrolisthesis of L1 on L2 through L4 on L5, measuring up to 4 mm at the level of L2-3. Findings chronic and facet mediated. Vertebrae: Vertebral body height maintained without acute or chronic fracture. Bone marrow signal intensity within normal limits. Few scattered subcentimeter benign hemangiomata noted. No worrisome osseous lesions. No abnormal marrow edema or enhancement to suggest osteomyelitis discitis or septic arthritis. Conus medullaris and cauda equina: Conus extends to the L1 level. Diffuse smooth leptomeningeal and/or dural enhancement seen about the distal cord and conus medullaris, with extension about the distal thecal sac (series 12, image 10). While this finding could be  reactive in nature related to recent LP attempts, a possible acute infectious or inflammatory process could also have this appearance. No significant intramedullary enhancement about the distal cord or conus. No abnormal thickening or enhancement about the nerve roots of the cauda equina. Paraspinal and other soft tissues: Soft tissue edema with mild enhancement seen within the posterior right greater than left paraspinous musculature at the level of L3-L5, suspected be related to recent LP attempts (series 6, images 6, 12). No discrete collections or evidence for CSF leak. Paraspinous soft tissues demonstrate no other acute finding. Few scattered simple cyst noted within the visualized kidneys. Visualized visceral structures otherwise unremarkable. Disc levels: T11-12: T2 hyperintense signal intensity within the T11-12 interspace without significant disc bulge, likely degenerative. Mild facet hypertrophy. No significant stenosis.  T12-L1: Diffuse disc bulge with disc desiccation and intervertebral disc space narrowing. Mild facet hypertrophy. No significant spinal stenosis. Foramina remain patent. L1-2: Mild diffuse disc bulge with disc desiccation and intervertebral disc space narrowing. Mild facet and ligament flavum hypertrophy. No significant spinal stenosis. Foramina remain patent. L2-3: Retrolisthesis. Diffuse disc bulge with disc desiccation and intervertebral disc space narrowing. Disc bulging eccentric to the right. Reactive endplate change. Mild facet and ligament flavum hypertrophy. Resultant mild to moderate canal with right worse than left lateral recess stenosis. Mild right worse than left L2 foraminal narrowing. L3-4: Diffuse disc bulge with disc desiccation and intervertebral disc space narrowing. Reactive endplate change. Mild to moderate facet and ligament flavum hypertrophy. Resultant mild canal with mild to moderate bilateral lateral recess stenosis, slightly worse on the right. Moderate right with mild to moderate left L3 foraminal stenosis. L4-5: Degenerative intervertebral disc space narrowing with diffuse disc bulge and disc desiccation. Associated discogenic reactive endplate change with marginal endplate osteophytic spurring. Superimposed small central disc protrusion indents the ventral thecal sac (series 8, image 27). Mild to moderate facet hypertrophy. Resultant moderate canal with moderate to severe bilateral subarticular stenosis. Moderate bilateral L4 foraminal stenosis. L5-S1: Degenerative intervertebral disc space narrowing with diffuse disc bulge and disc desiccation. Associated reactive endplate spurring. Mild to moderate left greater than right facet hypertrophy. No significant spinal stenosis. Moderate bilateral L5 foraminal stenosis. IMPRESSION: 1. Smooth leptomeningeal and/or dural enhancement about the distal cord and conus medullaris, with extension about the distal thecal sac. While this finding  could be reactive in nature related to recent LP attempts, a possible acute infectious or inflammatory process could also have this appearance. Although several previous LP attempts have reportedly already been performed, a repeat attempt with CSF analysis suggested for further evaluation. 2. No other evidence for acute infection within the lumbar spine. 3. Mild soft tissue edema and enhancement within the posterior right greater than left paraspinous musculature at the level of L3-L5, most likely related to recent LP attempts. No discrete collections or evidence for CSF leak. 4. Multilevel degenerative spondylosis throughout the lumbar spine as detailed above. Resultant up to moderate spinal stenosis at the L2-3 through L4-5 levels as above. Associated moderate bilateral L3 through L5 foraminal narrowing. Electronically Signed   By: Jeannine Boga M.D.   On: 02/04/2020 01:34   DG Fluoro Rm 1-60 Min  Result Date: 02/03/2020 CLINICAL DATA:  Weakness, leg weakness and numbness in the lower extremities and hands. History of COVID infection in this 65 year old male EXAM: DIAGNOSTIC LUMBAR PUNCTURE UNDER FLUOROSCOPIC GUIDANCE FLUOROSCOPY TIME:  Fluoroscopy Time:  1 minutes 20 seconds Radiation Exposure Index (if provided by the fluoroscopic device): 11.6 mGy Number of Acquired Spot Images:  1 PROCEDURE: Informed consent was obtained from the patient prior to the procedure, including potential complications of headache, allergy, and pain. With the patient prone, the lower back was prepped with Betadine. 1% Lidocaine was used for local anesthesia. Lumbar puncture was attempted at the L4-5, L3-4 and L2-3 levels level using a 20, 5 inch gauge needle. Access was unsuccessful despite multiple attempts by 2 different radiologists. Attempts were made in both the oblique and prone position. Saved images show a needle adjacent to the L3-4 level. This was for leveling purposes only. Attempts were made to access the lucent  area adjacent to spinous processes at this level on both sides of the spinous process, at the level below and the level above without success. Lateral image saved due to difficulty in accessing the spinal canal with 5 inch needle shows that with approximately 1-2 inches outside of the skin there was still a distance to travel to access the canal. Final set of images shows no significant change with pillow in place under the abdomen in the appearance of the spine. IMPRESSION: 1. Unsuccessful lumbar puncture attempt at multiple levels as outlined above. 2. With suggest MRI of the lumbar spine for further evaluation to assess both the nature of degenerative changes and the position of the conus for further attempts, potentially in a biplane room which would aid in needle localization and allow for positioning that might improve access to the spinal canal. These results were called by telephone at the time of interpretation on 02/03/2020 at 5:22 pm to provider Dr. Roslynn Amble, who verbally acknowledged these results. Electronically Signed   By: Zetta Bills M.D.   On: 02/03/2020 17:59   DG CHEST PORT 1 VIEW  Result Date: 02/03/2020 CLINICAL DATA:  COVID 19 positive EXAM: PORTABLE CHEST 1 VIEW COMPARISON:  None. FINDINGS: The heart size and mediastinal contours are within normal limits. Both lungs are clear. The visualized skeletal structures are unremarkable. IMPRESSION: No active disease. Electronically Signed   By: Randa Ngo M.D.   On: 02/03/2020 21:56    Scheduled Meds: . amLODipine  5 mg Oral Daily  . atorvastatin  40 mg Oral QHS  . insulin aspart  0-9 Units Subcutaneous TID WC  . metoprolol tartrate  50 mg Oral BID  . tamsulosin  0.4 mg Oral Daily   Continuous Infusions: . Immune Globulin 10% 45 g (02/04/20 0147)     LOS: 1 day   Marylu Lund, MD [facility] Pager On Amion  If 7PM-7AM, please contact night-coverage 02/04/2020, 6:51 PM

## 2020-02-04 NOTE — Progress Notes (Signed)
Patient pain not well controlled on current PRN, MD notified orders received

## 2020-02-04 NOTE — Plan of Care (Signed)
  Problem: Education: Goal: Knowledge of General Education information will improve Description: Including pain rating scale, medication(s)/side effects and non-pharmacologic comfort measures Outcome: Progressing   Problem: Clinical Measurements: Goal: Ability to maintain clinical measurements within normal limits will improve Outcome: Progressing Goal: Will remain free from infection Outcome: Progressing Goal: Diagnostic test results will improve Outcome: Progressing Goal: Respiratory complications will improve Outcome: Progressing Goal: Cardiovascular complication will be avoided Outcome: Progressing   Problem: Activity: Goal: Risk for activity intolerance will decrease Outcome: Progressing   Problem: Nutrition: Goal: Adequate nutrition will be maintained Outcome: Progressing   Problem: Coping: Goal: Level of anxiety will decrease Outcome: Progressing   Problem: Elimination: Goal: Will not experience complications related to bowel motility Outcome: Progressing Goal: Will not experience complications related to urinary retention Outcome: Progressing   Problem: Safety: Goal: Ability to remain free from injury will improve Outcome: Progressing   Problem: Skin Integrity: Goal: Risk for impaired skin integrity will decrease Outcome: Progressing   

## 2020-02-05 DIAGNOSIS — U071 COVID-19: Secondary | ICD-10-CM | POA: Diagnosis not present

## 2020-02-05 DIAGNOSIS — G629 Polyneuropathy, unspecified: Secondary | ICD-10-CM | POA: Diagnosis not present

## 2020-02-05 DIAGNOSIS — I10 Essential (primary) hypertension: Secondary | ICD-10-CM | POA: Diagnosis not present

## 2020-02-05 LAB — CBC WITH DIFFERENTIAL/PLATELET
Abs Immature Granulocytes: 0.03 10*3/uL (ref 0.00–0.07)
Basophils Absolute: 0 10*3/uL (ref 0.0–0.1)
Basophils Relative: 0 %
Eosinophils Absolute: 0 10*3/uL (ref 0.0–0.5)
Eosinophils Relative: 0 %
HCT: 47.5 % (ref 39.0–52.0)
Hemoglobin: 15.9 g/dL (ref 13.0–17.0)
Immature Granulocytes: 0 %
Lymphocytes Relative: 14 %
Lymphs Abs: 1 10*3/uL (ref 0.7–4.0)
MCH: 30.5 pg (ref 26.0–34.0)
MCHC: 33.5 g/dL (ref 30.0–36.0)
MCV: 91.2 fL (ref 80.0–100.0)
Monocytes Absolute: 0.6 10*3/uL (ref 0.1–1.0)
Monocytes Relative: 8 %
Neutro Abs: 5.5 10*3/uL (ref 1.7–7.7)
Neutrophils Relative %: 78 %
Platelets: 186 10*3/uL (ref 150–400)
RBC: 5.21 MIL/uL (ref 4.22–5.81)
RDW: 12.3 % (ref 11.5–15.5)
WBC: 7.2 10*3/uL (ref 4.0–10.5)
nRBC: 0 % (ref 0.0–0.2)

## 2020-02-05 LAB — COMPREHENSIVE METABOLIC PANEL
ALT: 22 U/L (ref 0–44)
AST: 23 U/L (ref 15–41)
Albumin: 2.9 g/dL — ABNORMAL LOW (ref 3.5–5.0)
Alkaline Phosphatase: 64 U/L (ref 38–126)
Anion gap: 7 (ref 5–15)
BUN: 24 mg/dL — ABNORMAL HIGH (ref 8–23)
CO2: 28 mmol/L (ref 22–32)
Calcium: 8.4 mg/dL — ABNORMAL LOW (ref 8.9–10.3)
Chloride: 98 mmol/L (ref 98–111)
Creatinine, Ser: 0.83 mg/dL (ref 0.61–1.24)
GFR, Estimated: >60 mL/min (ref >60)
Glucose, Bld: 194 mg/dL — ABNORMAL HIGH (ref 70–99)
Potassium: 4.2 mmol/L (ref 3.5–5.1)
Sodium: 133 mmol/L — ABNORMAL LOW (ref 135–145)
Total Bilirubin: 1.6 mg/dL — ABNORMAL HIGH (ref 0.3–1.2)
Total Protein: 7.8 g/dL (ref 6.5–8.1)

## 2020-02-05 LAB — GLUCOSE, CAPILLARY
Glucose-Capillary: 194 mg/dL — ABNORMAL HIGH (ref 70–99)
Glucose-Capillary: 195 mg/dL — ABNORMAL HIGH (ref 70–99)
Glucose-Capillary: 179 mg/dL — ABNORMAL HIGH (ref 70–99)
Glucose-Capillary: 172 mg/dL — ABNORMAL HIGH (ref 70–99)
Glucose-Capillary: 196 mg/dL — ABNORMAL HIGH (ref 70–99)

## 2020-02-05 LAB — C-REACTIVE PROTEIN: CRP: 3 mg/dL — ABNORMAL HIGH (ref <1.0)

## 2020-02-05 LAB — METHYLMALONIC ACID, SERUM: Methylmalonic Acid, Quantitative: 179 nmol/L (ref 0–378)

## 2020-02-05 LAB — D-DIMER, QUANTITATIVE: D-Dimer, Quant: 0.68 ug/mL-FEU — ABNORMAL HIGH (ref 0.00–0.50)

## 2020-02-05 MED ORDER — HYDROMORPHONE HCL 1 MG/ML IJ SOLN
0.5000 mg | Freq: Every day | INTRAMUSCULAR | Status: DC | PRN
  Administered 2020-02-05: 0.5 mg via INTRAVENOUS
  Filled 2020-02-05 (×2): qty 0.5

## 2020-02-05 NOTE — Progress Notes (Signed)
Pt completed 2nd dose of IVIG  well with no reaction noted, Vs  Stable, will continue to monitor.

## 2020-02-05 NOTE — Progress Notes (Signed)
PROGRESS NOTE    Jon Townsend  [id number] DOB: Nov 08, 1955 DOA: 02/03/2020 PCP: [name], DO    Brief Narrative:  65 y.o. male with history of hypertension, hyperlipidemia, diabetes mellitus type 2 who has had Covid infection in March 2021 was given antibodies following which symptoms resolved has been having some upper respiratory tract symptoms last month for which patient received prednisone antibiotics and also last week for which again patient received prednisone.  Patient started experiencing tingling and numbness of the upper extremities on Sunday February 01, 2020 which slowly progressed proximally and also started involving the lower extremities.  At that point patient came to the ER was evaluated by neurologist and outpatient further work-up was recommended.  But since patient's symptoms got more worse with weakness of the lower extremities patient decided to come to the ER.  Patient also has been having low back pain denies any fever chills or any incontinence of urine or bowel.  Assessment & Plan:   Principal Problem:   Polyneuropathy Active Problems:   Diabetes mellitus type 2 in obese (HCC)   Weakness   Essential hypertension   COVID-19 virus infection  1. Guillain-Barr syndrome/polyneuropathy 1. Nephrology following 2. Pt is continued on IVIG per Neurology recs, to complete total 5 days 2. Low back pain  1. MRI L-spine reviewed. Findings consistent with prior LP attempts noted 2. Little to no benefit with fentanyl IV 3. Will give trial of heat pack and toradol 4. PT/OT consulted thus far with recs for CIR. However, per CIR, pt will need to be >20 days from pos covid test with recovery in symptoms or 2 negative tests before considered for inpt CIR 5. Discussed with Neurology. Recommendation to avoid muscle relaxants. Instead, continue on PRN dilaudid with NSAIDs as needed and lidocaine patch 3. Covid infection positive but patient is asymptomatic.    1. Will follow inflammatory markers 2. CRP up to 3.0, however pt reports feeling better 4. Diabetes mellitus type 2 1.  Will continue SSI coverage as needed. 5. Hypertension 1. continue on amlodipine and beta-blockers as tolerated 6. Hyperlipidemia on statins. 7. BPH on Flomax.  DVT prophylaxis: SCD's Code Status: Full Family Communication: Pt in room, family not at bedside  Status is: Inpatient  Remains inpatient appropriate because:IV treatments appropriate due to intensity of illness or inability to take PO and Inpatient level of care appropriate due to severity of illness   Dispo: The patient is from: Home              Anticipated d/c is to: Home              Anticipated d/c date is: 3 days              Patient currently is not medically stable to d/c.   Difficult to place patient No    Consultants:   Neurology  Procedures:     Antimicrobials: Anti-infectives (From admission, onward)   None      Subjective: Without complaints this AM  Objective: Vitals:   02/05/20 0430 02/05/20 0824 02/05/20 1152 02/05/20 1618  BP: (!) 144/78 (!) 167/87 (!) 141/85 (!) 163/90  Pulse: 72 74 67 67  Resp: 17 20 20 16   Temp: 97.9 F (36.6 C) 97.6 F (36.4 C) 98.5 F (36.9 C) 98 F (36.7 C)  TempSrc: Oral Oral Oral   SpO2: 97% 98% 99% 98%  Weight:      Height:        Intake/Output Summary (  Last 24 hours) at 02/05/2020 1640 Last data filed at 02/05/2020 0500 Gross per 24 hour  Intake 1010 ml  Output 550 ml  Net 460 ml   Filed Weights   02/03/20 1228  Weight: 113.4 kg    Examination: General exam: Awake, laying in bed, in nad Respiratory system: Normal respiratory effort, no audible wheezing Cardiovascular system: perfused, no LE edema Gastrointestinal system: Soft, nondistended, positive BS Central nervous system: CN2-12 grossly intact, no tremors Extremities: Perfused, no clubbing Skin: Normal skin turgor, no notable skin lesions seen Psychiatry: Mood  normal // no visual hallucinations    Data Reviewed: I have personally reviewed following labs and imaging studies  CBC: Recent Labs  Lab 02/02/20 0800 02/03/20 1734 02/04/20 0140 02/05/20 0442  WBC 10.4 10.1 10.8* 7.2  NEUTROABS 7.9* 7.7 8.9* 5.5  HGB 16.3 16.3 16.2 15.9  HCT 47.4 50.6 46.4 47.5  MCV 91.3 95.5 89.9 91.2  PLT 200 183 208 [id number]   Basic Metabolic Panel: Recent Labs  Lab 02/02/20 0800 02/03/20 1738 02/04/20 0140 02/05/20 0442  NA 139 134* 133* 133*  K 3.3* 4.3 4.0 4.2  CL 101 101 97* 98  CO2 27 21* 26 28  GLUCOSE 152* 172* 162* 194*  BUN 16 27* 24* 24*  CREATININE 0.95 0.79 0.85 0.83  CALCIUM 9.0 8.5* 8.3* 8.4*   GFR: Estimated Creatinine Clearance: 115.5 mL/min (by C-G formula based on SCr of 0.83 mg/dL). Liver Function Tests: Recent Labs  Lab 02/02/20 0800 02/03/20 1738 02/04/20 0140 02/05/20 0442  AST 15 28 21 23   ALT 19 16 21 22   ALKPHOS 77 74 72 64  BILITOT 1.5* 1.7* 1.5* 1.6*  PROT 6.6 6.0* 6.3* 7.8  ALBUMIN 3.5 3.3* 3.3* 2.9*   No results for input(s): LIPASE, AMYLASE in the last 168 hours. No results for input(s): AMMONIA in the last 168 hours. Coagulation Profile: No results for input(s): INR, PROTIME in the last 168 hours. Cardiac Enzymes: Recent Labs  Lab 02/02/20 1556 02/03/20 1738  CKTOTAL 96 216   BNP (last 3 results) No results for input(s): PROBNP in the last 8760 hours. HbA1C: Recent Labs    02/03/20 1734  HGBA1C 7.7*   CBG: Recent Labs  Lab 02/04/20 1836 02/04/20 2056 02/05/20 0624 02/05/20 1151 02/05/20 1616  GLUCAP 229* 187* 194* 195* 179*   Lipid Profile: No results for input(s): CHOL, HDL, LDLCALC, TRIG, CHOLHDL, LDLDIRECT in the last 72 hours. Thyroid Function Tests: Recent Labs    02/04/20 0140  TSH 1.120   Anemia Panel: No results for input(s): VITAMINB12, FOLATE, FERRITIN, TIBC, IRON, RETICCTPCT in the last 72 hours. Sepsis Labs: No results for input(s): PROCALCITON, LATICACIDVEN in the  last 168 hours.  No results found for this or any previous visit (from the past 240 hour(s)).   Radiology Studies: MR Lumbar Spine W Wo Contrast  Result Date: 02/04/2020 CLINICAL DATA:  65 year old male with history of worsening tetraparesis and sensory disturbance in the context of repeat COVID infection, concern for Guillain-Barre. Status post multiple recent failed LP attempts. EXAM: MRI LUMBAR SPINE WITHOUT AND WITH CONTRAST TECHNIQUE: Multiplanar and multiecho pulse sequences of the lumbar spine were obtained without and with intravenous contrast. CONTRAST:  56mL GADAVIST GADOBUTROL 1 MMOL/ML IV SOLN COMPARISON:  Previous radiograph from 04/03/2016. FINDINGS: Segmentation: Standard. Lowest well-formed disc space labeled the L5-S1 level. Alignment: Mild accentuation of the normal lumbar lordosis. Trace stepwise retrolisthesis of L1 on L2 through L4 on L5, measuring up to 4 mm at the  level of L2-3. Findings chronic and facet mediated. Vertebrae: Vertebral body height maintained without acute or chronic fracture. Bone marrow signal intensity within normal limits. Few scattered subcentimeter benign hemangiomata noted. No worrisome osseous lesions. No abnormal marrow edema or enhancement to suggest osteomyelitis discitis or septic arthritis. Conus medullaris and cauda equina: Conus extends to the L1 level. Diffuse smooth leptomeningeal and/or dural enhancement seen about the distal cord and conus medullaris, with extension about the distal thecal sac (series 12, image 10). While this finding could be reactive in nature related to recent LP attempts, a possible acute infectious or inflammatory process could also have this appearance. No significant intramedullary enhancement about the distal cord or conus. No abnormal thickening or enhancement about the nerve roots of the cauda equina. Paraspinal and other soft tissues: Soft tissue edema with mild enhancement seen within the posterior right greater than left  paraspinous musculature at the level of L3-L5, suspected be related to recent LP attempts (series 6, images 6, 12). No discrete collections or evidence for CSF leak. Paraspinous soft tissues demonstrate no other acute finding. Few scattered simple cyst noted within the visualized kidneys. Visualized visceral structures otherwise unremarkable. Disc levels: T11-12: T2 hyperintense signal intensity within the T11-12 interspace without significant disc bulge, likely degenerative. Mild facet hypertrophy. No significant stenosis. T12-L1: Diffuse disc bulge with disc desiccation and intervertebral disc space narrowing. Mild facet hypertrophy. No significant spinal stenosis. Foramina remain patent. L1-2: Mild diffuse disc bulge with disc desiccation and intervertebral disc space narrowing. Mild facet and ligament flavum hypertrophy. No significant spinal stenosis. Foramina remain patent. L2-3: Retrolisthesis. Diffuse disc bulge with disc desiccation and intervertebral disc space narrowing. Disc bulging eccentric to the right. Reactive endplate change. Mild facet and ligament flavum hypertrophy. Resultant mild to moderate canal with right worse than left lateral recess stenosis. Mild right worse than left L2 foraminal narrowing. L3-4: Diffuse disc bulge with disc desiccation and intervertebral disc space narrowing. Reactive endplate change. Mild to moderate facet and ligament flavum hypertrophy. Resultant mild canal with mild to moderate bilateral lateral recess stenosis, slightly worse on the right. Moderate right with mild to moderate left L3 foraminal stenosis. L4-5: Degenerative intervertebral disc space narrowing with diffuse disc bulge and disc desiccation. Associated discogenic reactive endplate change with marginal endplate osteophytic spurring. Superimposed small central disc protrusion indents the ventral thecal sac (series 8, image 27). Mild to moderate facet hypertrophy. Resultant moderate canal with moderate to  severe bilateral subarticular stenosis. Moderate bilateral L4 foraminal stenosis. L5-S1: Degenerative intervertebral disc space narrowing with diffuse disc bulge and disc desiccation. Associated reactive endplate spurring. Mild to moderate left greater than right facet hypertrophy. No significant spinal stenosis. Moderate bilateral L5 foraminal stenosis. IMPRESSION: 1. Smooth leptomeningeal and/or dural enhancement about the distal cord and conus medullaris, with extension about the distal thecal sac. While this finding could be reactive in nature related to recent LP attempts, a possible acute infectious or inflammatory process could also have this appearance. Although several previous LP attempts have reportedly already been performed, a repeat attempt with CSF analysis suggested for further evaluation. 2. No other evidence for acute infection within the lumbar spine. 3. Mild soft tissue edema and enhancement within the posterior right greater than left paraspinous musculature at the level of L3-L5, most likely related to recent LP attempts. No discrete collections or evidence for CSF leak. 4. Multilevel degenerative spondylosis throughout the lumbar spine as detailed above. Resultant up to moderate spinal stenosis at the L2-3 through L4-5 levels as  above. Associated moderate bilateral L3 through L5 foraminal narrowing. Electronically Signed   By: Jeannine Boga M.D.   On: 02/04/2020 01:34   DG Fluoro Rm 1-60 Min  Result Date: 02/03/2020 CLINICAL DATA:  Weakness, leg weakness and numbness in the lower extremities and hands. History of COVID infection in this 65 year old male EXAM: DIAGNOSTIC LUMBAR PUNCTURE UNDER FLUOROSCOPIC GUIDANCE FLUOROSCOPY TIME:  Fluoroscopy Time:  1 minutes 20 seconds Radiation Exposure Index (if provided by the fluoroscopic device): 11.6 mGy Number of Acquired Spot Images: 1 PROCEDURE: Informed consent was obtained from the patient prior to the procedure, including potential  complications of headache, allergy, and pain. With the patient prone, the lower back was prepped with Betadine. 1% Lidocaine was used for local anesthesia. Lumbar puncture was attempted at the L4-5, L3-4 and L2-3 levels level using a 20, 5 inch gauge needle. Access was unsuccessful despite multiple attempts by 2 different radiologists. Attempts were made in both the oblique and prone position. Saved images show a needle adjacent to the L3-4 level. This was for leveling purposes only. Attempts were made to access the lucent area adjacent to spinous processes at this level on both sides of the spinous process, at the level below and the level above without success. Lateral image saved due to difficulty in accessing the spinal canal with 5 inch needle shows that with approximately 1-2 inches outside of the skin there was still a distance to travel to access the canal. Final set of images shows no significant change with pillow in place under the abdomen in the appearance of the spine. IMPRESSION: 1. Unsuccessful lumbar puncture attempt at multiple levels as outlined above. 2. With suggest MRI of the lumbar spine for further evaluation to assess both the nature of degenerative changes and the position of the conus for further attempts, potentially in a biplane room which would aid in needle localization and allow for positioning that might improve access to the spinal canal. These results were called by telephone at the time of interpretation on 02/03/2020 at 5:22 pm to provider Dr. Roslynn Amble, who verbally acknowledged these results. Electronically Signed   By: Zetta Bills M.D.   On: 02/03/2020 17:59   DG CHEST PORT 1 VIEW  Result Date: 02/03/2020 CLINICAL DATA:  COVID 19 positive EXAM: PORTABLE CHEST 1 VIEW COMPARISON:  None. FINDINGS: The heart size and mediastinal contours are within normal limits. Both lungs are clear. The visualized skeletal structures are unremarkable. IMPRESSION: No active disease.  Electronically Signed   By: Randa Ngo M.D.   On: 02/03/2020 21:56    Scheduled Meds: . amLODipine  5 mg Oral Daily  . atorvastatin  40 mg Oral QHS  . insulin aspart  0-9 Units Subcutaneous TID WC  . metoprolol tartrate  50 mg Oral BID  . tamsulosin  0.4 mg Oral Daily   Continuous Infusions: . Immune Globulin 10% Stopped (02/05/20 0116)     LOS: 2 days   Marylu Lund, MD [facility] Pager On Amion  If 7PM-7AM, please contact night-coverage 02/05/2020, 4:40 PM

## 2020-02-05 NOTE — Progress Notes (Signed)
Call made to RN regarding IVIG dose scheduling. RN states she is on her way to pharmacy to pick up dose and will be ready soon

## 2020-02-05 NOTE — Progress Notes (Signed)
NIF -40/VC 2.7L with excellent effort.

## 2020-02-05 NOTE — Evaluation (Signed)
Physical Therapy Evaluation Patient Details Name: Jon Townsend MRN: [id number] DOB: [birth year] Today's Date: 02/05/2020   History of Present Illness  65 y.o. male with history of hypertension, hyperlipidemia, diabetes mellitus type 2 who has had Covid infection in March 2021 was given antibodies following which symptoms resolved has been having some upper respiratory tract symptoms last month. Patient started experiencing tingling and numbness of the upper extremities on Sunday February 01, 2020 which slowly progressed proximally and also started involving the lower extremities. Admitted for polyneuropathy workup.  Clinical Impression  Pt presents to PT with deficits in functional mobility, gait, balance, strength, power, endurance, sensation. Pt with significant BLE weakness and sensation deficits in all extremities. Pt with knee buckling during standing activity and requires significant physical assistance to transfer at this time. Pt will benefit from continued acute PT POC and aggressive mobilization to improve mobility quality and reduce falls risk. PT recommends CIR placement as the pt was independent prior to admission and demonstrates the potential to make significant functional gains with high intensity inpatient PT services.    Follow Up Recommendations CIR    Equipment Recommendations  Wheelchair (measurements PT);Wheelchair cushion (measurements PT);Hospital bed (mechanical lift, all if D/C home today)    Recommendations for Other Services Rehab consult     Precautions / Restrictions Precautions Precautions: Fall Restrictions Weight Bearing Restrictions: No      Mobility  Bed Mobility Overal bed mobility: Needs Assistance Bed Mobility: Supine to Sit     Supine to sit: Supervision;HOB elevated     General bed mobility comments: use of bed rail    Transfers Overall transfer level: Needs assistance Equipment used: 1 person hand held assist Transfers: Sit to/from  Stand;Stand Pivot Transfers Sit to Stand: Mod assist Stand pivot transfers: Max assist       General transfer comment: PT provides knee block and BUE support. Pt with R knee buckle during SPT  Ambulation/Gait                Stairs            Wheelchair Mobility    Modified Rankin (Stroke Patients Only)       Balance Overall balance assessment: Needs assistance Sitting-balance support: No upper extremity supported;Feet supported Sitting balance-Leahy Scale: Fair     Standing balance support: Bilateral upper extremity supported Standing balance-Leahy Scale: Poor Standing balance comment: modA for static standing balance                             Pertinent Vitals/Pain Pain Assessment: Faces Faces Pain Scale: Hurts little more Pain Location: BUE and BLE Pain Descriptors / Indicators: Tingling Pain Intervention(s): Monitored during session    Home Living Family/patient expects to be discharged to:: Private residence Living Arrangements: Spouse/significant other Available Help at Discharge: Family;Available 24 hours/day Type of Home: House Home Access: Stairs to enter Entrance Stairs-Rails: None Entrance Stairs-Number of Steps: 2 Home Layout: Two level;Able to live on main level with bedroom/bathroom Home Equipment: Gilford Rile - 2 wheels;Shower seat;Bedside commode Additional Comments: spouse with recent hip fx, still utilizing cane to ambulate, cannot provide physical assistance    Prior Function Level of Independence: Independent               Hand Dominance        Extremity/Trunk Assessment   Upper Extremity Assessment Upper Extremity Assessment: Generalized weakness;RUE deficits/detail;LUE deficits/detail RUE Sensation: decreased light touch LUE Sensation: decreased light touch  Lower Extremity Assessment Lower Extremity Assessment: RLE deficits/detail;LLE deficits/detail RLE Deficits / Details: grossly 4-/5 RLE Sensation:  decreased light touch LLE Deficits / Details: grossly 4-/5 LLE Sensation: decreased light touch    Cervical / Trunk Assessment Cervical / Trunk Assessment: Normal  Communication   Communication: No difficulties  Cognition Arousal/Alertness: Awake/alert Behavior During Therapy: WFL for tasks assessed/performed Overall Cognitive Status: Within Functional Limits for tasks assessed                                        General Comments General comments (skin integrity, edema, etc.): vss on RA    Exercises     Assessment/Plan    PT Assessment Patient needs continued PT services  PT Problem List Decreased strength;Decreased activity tolerance;Decreased balance;Decreased mobility;Decreased knowledge of use of DME;Decreased safety awareness;Decreased knowledge of precautions;Impaired sensation       PT Treatment Interventions DME instruction;Gait training;Functional mobility training;Therapeutic activities;Therapeutic exercise;Balance training;Neuromuscular re-education;Cognitive remediation;Patient/family education;Wheelchair mobility training    PT Goals (Current goals can be found in the Care Plan section)  Acute Rehab PT Goals Patient Stated Goal: to return to independent mobility PT Goal Formulation: With patient Time For Goal Achievement: 02/19/20 Potential to Achieve Goals: Good    Frequency Min 4X/week   Barriers to discharge        Co-evaluation               AM-PAC PT "6 Clicks" Mobility  Outcome Measure Help needed turning from your back to your side while in a flat bed without using bedrails?: A Lot Help needed moving from lying on your back to sitting on the side of a flat bed without using bedrails?: A Lot Help needed moving to and from a bed to a chair (including a wheelchair)?: A Lot Help needed standing up from a chair using your arms (e.g., wheelchair or bedside chair)?: A Lot Help needed to walk in hospital room?: Total Help needed  climbing 3-5 steps with a railing? : Total 6 Click Score: 10    End of Session   Activity Tolerance: Patient tolerated treatment well Patient left: in chair;with call bell/phone within reach Nurse Communication: Mobility status;Need for lift equipment (STEDY) PT Visit Diagnosis: Other abnormalities of gait and mobility (R26.89);Muscle weakness (generalized) (M62.81);Other symptoms and signs involving the nervous system (R29.898)    Time: 8119-1478 PT Time Calculation (min) (ACUTE ONLY): 23 min   Charges:   PT Evaluation $PT Eval Moderate Complexity: 1 Mod PT Treatments $Therapeutic Activity: 8-22 mins        Zenaida Niece, PT, DPT Acute Rehabilitation Pager: [phone]   Zenaida Niece 02/05/2020, 11:07 AM

## 2020-02-05 NOTE — Progress Notes (Signed)
Inpatient Rehab Admissions Coordinator Note:   Inpatient rehab prescreen received. Noted COVID + 1/24.  Patients are eligible to be considered for admit to the [facility] when cleared from airborne precautions by acute MD or Infectious disease, regardless of onset day. Otherwise they will need to be >20 days from their positive test with recovery/improvement in symptoms (2/14) or 2 negative tests. Please call me with any questions, or if patient meets above criteria.    Shann Medal, PT, DPT [phone] 02/05/20 12:30 PM

## 2020-02-05 NOTE — Progress Notes (Signed)
Pt now resting with no c/o of pain at present. RN will continue to monitor. Pt remain om Airborne / contact Isolation for + covid..  Education given to pt to have te vaccine after 3 months which pt demonstrated good  understanding.

## 2020-02-05 NOTE — Progress Notes (Signed)
Patient eating breakfast with no complaints of pain. Night shift RN gave one time doses of Robaxin & Dilaudid that were effective. Paged MD regarding changing to these medications since they were helped overnight. Patient stated that the Toradol and tylenol did not help his pain. Patient also asked about taking metformin, which He takes in the am at home for his DM. No new orders at this time. Will continue to monitor.

## 2020-02-05 NOTE — Plan of Care (Signed)

## 2020-02-05 NOTE — Progress Notes (Signed)
NIF -45 cmh20, VC 2.1 L/min.  Excellent effort

## 2020-02-05 NOTE — Progress Notes (Signed)
Pt had been c/o severe pain to lower back and radiate to bil le  Which the fentanyl iv did not help.  Toradol IV given  And pt still was c/o of severe pain with tingling and  numbness On call MD notified   And Dilaudid 0.5 mg and Robaxin  1000gm given together. Pt had immediate relief  With no more tingling and numbness and pt was able to move around in bed quiet easily. Less than 15 minute after administration. Pt rested at leas for about 4 hrs without c/o until this am around 0430  While moving pt around to give a bath.  Toradol 15 mg given and pt verbalized  Some relief. On call MD made aware. Pt may have been having neurophatic pain with muscle spasm which  Robaxin was very effective to help relief the pain.

## 2020-02-05 NOTE — Progress Notes (Signed)
Occupational Therapy Evaluation  PTA pt independent with ADL and mobility and had just retired from [employer] 1/14. Pt currently requires Max A with mobility and Mod A with ADL tasks due to below listed deficits. Pt is very motivated to be more independent and is an excellent candidate for CIR. Will follow acutely.  VSS during session on RA.    02/05/20 1200  OT Visit Information  Last OT Received On 02/05/20  Assistance Needed +2  History of Present Illness 65 y.o. male with history of hypertension, hyperlipidemia, diabetes mellitus type 2 who has had Covid infection in March 2021 was given antibodies following which symptoms resolved has been having some upper respiratory tract symptoms last month. Patient started experiencing tingling and numbness of the upper extremities on Sunday February 01, 2020 which slowly progressed proximally and also started involving the lower extremities. Admitted for polyneuropathy workup.  Precautions  Precautions Fall  Home Living  Family/patient expects to be discharged to: Private residence  Living Arrangements Spouse/significant other  Available Help at Discharge Family;Available 24 hours/day  Type of Home House  Home Access Stairs to enter  Entrance Stairs-Number of Steps 2  Entrance Stairs-Rails None  Home Layout Two level;Able to live on main level with bedroom/bathroom  Engineer, manufacturing systems Yes  How Accessible Accessible via walker;Accessible via wheelchair  [place] - 2 wheels;Shower seat;BSC  Additional Comments spouse with recent hip fx, still utilizing cane to ambulate, cannot provide physical assistance  Prior Function  Level of Independence Independent  Comments Just retired 1/14  Communication  Communication No difficulties  Pain Assessment  Pain Assessment Faces  Faces Pain Scale 4  Pain Location BUE and BLE  Pain Descriptors / Indicators Tingling  Pain  Intervention(s) Limited activity within patient's tolerance  Cognition  Arousal/Alertness Awake/alert  Behavior During Therapy WFL for tasks assessed/performed  Overall Cognitive Status Within Functional Limits for tasks assessed  Upper Extremity Assessment  Upper Extremity Assessment RUE deficits/detail;LUE deficits/detail  RUE Deficits / Details shoulder 3/5; difficulty sustaining at 90; elbow flex/ext 3+/5; wrist/hand @ 3+/5; difficulty with in-hand manipulation skills  RUE Sensation decreased light touch (sensation worse distally)  LUE Deficits / Details similar to RUE however R shoulder strength weaker  LUE Sensation decreased light touch  Lower Extremity Assessment  Lower Extremity Assessment Defer to PT evaluation  Cervical / Trunk Assessment  Cervical / Trunk Assessment Other exceptions (unablet o maintain upright posture without support; complaining of back pain)  ADL  Overall ADL's  Needs assistance/impaired  Eating/Feeding Set up;Supervision/ safety  Eating/Feeding Details (indicate cue type and reason) may benfit from built up tubing  Grooming Set up;Sitting  Upper Body Bathing Set up;Sitting  Lower Body Bathing Moderate assistance;Sit to/from stand  Upper Body Dressing  Minimal assistance;Sitting  Lower Body Dressing Moderate assistance;Sit to/from stand  Functional mobility during ADLs Maximal assistance  Vision- History  Baseline Vision/History Wears glasses  Wears Glasses At all times  Patient Visual Report No change from baseline  Vision- Assessment  Additional Comments will further assess; appears at baseline  Bed Mobility  General bed mobility comments  (OOB in chair)  Transfers  Overall transfer level Needs assistance  Equipment used 1 person hand held assist  Transfers Sit to/from Stand;Stand Pivot Transfers  Sit to Stand Mod assist  General transfer comment B knee buckling; pt staes "I can't feel my legs"  Balance  Overall balance assessment Needs  assistance  Sitting-balance support  No upper extremity supported;Feet supported  Sitting balance-Leahy Scale Fair  Standing balance support Bilateral upper extremity supported  Standing balance-Leahy Scale Poor  Standing balance comment modA for static standing balance  General Comments  General comments (skin integrity, edema, etc.) VSS on RA  Exercises  Exercises Other exercises  Other Exercises  Other Exercises squeeze ball issued  Other Exercises level 1 theraband issued - pt educated on general UE band ex - will benefit form HEP  OT - End of Session  Activity Tolerance Patient tolerated treatment well;Other (comment) (treatmetn limited by RT needing to work with pt)  Patient left in chair;with call bell/phone within reach;with nursing/sitter in room  Nurse Communication Mobility status  OT Assessment  OT Recommendation/Assessment Patient needs continued OT Services  OT Visit Diagnosis Unsteadiness on feet (R26.81);Other abnormalities of gait and mobility (R26.89);Muscle weakness (generalized) (M62.81)  OT Problem List Decreased strength;Decreased range of motion;Decreased activity tolerance;Impaired balance (sitting and/or standing);Decreased coordination;Decreased safety awareness;Decreased knowledge of use of DME or AE;Impaired UE functional use  OT Plan  OT Frequency (ACUTE ONLY) Min 2X/week  OT Treatment/Interventions (ACUTE ONLY) Self-care/ADL training;Therapeutic exercise;Neuromuscular education;DME and/or AE instruction;Therapeutic activities;Patient/family education;Balance training  AM-PAC OT "6 Clicks" Daily Activity Outcome Measure (Version 2)  Help from another person eating meals? 3  Help from another person taking care of personal grooming? 3  Help from another person toileting, which includes using toliet, bedpan, or urinal? 2  Help from another person bathing (including washing, rinsing, drying)? 2  Help from another person to put on and taking off regular upper  body clothing? 3  Help from another person to put on and taking off regular lower body clothing? 2  6 Click Score 15  OT Recommendation  Recommendations for Other Services Rehab consult  Follow Up Recommendations CIR;Supervision/Assistance - 24 hour  OT Equipment None recommended by OT  Individuals Consulted  Consulted and Agree with Results and Recommendations Patient  Acute Rehab OT Goals  Patient Stated Goal "to be like I was"  OT Goal Formulation With patient  Time For Goal Achievement 02/19/20  Potential to Achieve Goals Good  OT Time Calculation  OT Start Time (ACUTE ONLY) 1205  OT Stop Time (ACUTE ONLY) 1223  OT Time Calculation (min) 18 min  OT General Charges  $OT Visit 1 Visit  OT Evaluation  $OT Eval Moderate Complexity 1 Mod  Written Expression  Dominant Hand Right  Maurie Boettcher, OT/L   Acute OT Clinical Specialist Acute Rehabilitation Services Pager [phone] Office [phone]

## 2020-02-05 NOTE — Progress Notes (Addendum)
Neurology Progress Note   S:// Pt states he feels little better. Still has weakness and tingling sensations in the hands and legs. States his weakness is not getting worse. He states he feels little better when he gets infusions with less tingling. He c/o back pain. Talked to daughter on Phone and she states he needs something for pain on board as fentanyl is not working. Yesterday, he received one time dose of Dilaudid and Robaxin and that helped him. Had a discussion with Pt and his daughter that it takes a long time to recover from illness and to regain his strength and OT and PT will help.   O:// Current vital signs: BP (!) 167/87 (BP Location: Right Arm)   Pulse 74   Temp 97.6 F (36.4 C) (Oral)   Resp 20   Ht 6\' 1"  (1.854 m)   Wt 113.4 kg   SpO2 98%   BMI 32.98 kg/m  Vital signs in last 24 hours: Temp:  [97.6 F (36.4 C)-99 F (37.2 C)] 97.6 F (36.4 C) (01/27 0824) Pulse Rate:  [60-80] 74 (01/27 0824) Resp:  [17-20] 20 (01/27 0824) BP: (138-167)/(73-117) 167/87 (01/27 0824) SpO2:  [95 %-100 %] 98 % (01/27 0824) FiO2 (%):  [18 %] 18 % (01/26 2316) GENERAL: Awake, alert in NAD, Oriented to time, place, person and situation.  HEENT: - Normocephalic and atraumatic LUNGS - Symmetrical chest rise, No labored breathing noted. CV - no JVD, No Peripheral Edema ABDOMEN - Soft,  nondistended  Ext: warm, well perfused, intact peripheral pulses, no Peripheral edema NEURO:  Mental Status: AA&Ox4, Oriented to self, age, time, place, situation, Good Attention and Concentration Language: speech is fluent with comprehension intact. Cranial Nerves: PERRL, EOMI, no facial asymmetry, facial sensation intact, hearing intact, tongue/uvula/soft palate midline, normal sternocleidomastoid and trapezius muscle strength. No evidence of tongue atrophy or fibrillations Motor: No Drift in Upper Extremities, Pt able to lift both lower extremities but not able to hold for more than few seconds secondary  to back pain, Strength in Rt UE 4/5 in Lt UE 4/5, Strength in LEs 3+/5 Reflexes- UE - 0 , LE- 0, [place]- Down going  Tone: is normal and bulk is normal. Sensation- Intact to light touch bilaterally. Coordination: FTN intact bilaterally, no ataxia in BLE. Gait- deferred.  Medications  Current Facility-Administered Medications:  .  acetaminophen (TYLENOL) tablet 650 mg, 650 mg, Oral, Q6H PRN, 650 mg at 02/04/20 2053 **OR** acetaminophen (TYLENOL) suppository 650 mg, 650 mg, Rectal, Q6H PRN, Rise Patience, MD .  amLODipine (NORVASC) tablet 5 mg, 5 mg, Oral, Daily, Rise Patience, MD, 5 mg at 02/04/20 1000 .  atorvastatin (LIPITOR) tablet 40 mg, 40 mg, Oral, QHS, Rise Patience, MD, 40 mg at 02/04/20 2104 .  fentaNYL (SUBLIMAZE) injection 25 mcg, 25 mcg, Intravenous, Q2H PRN, Rise Patience, MD, 25 mcg at 02/04/20 [id number] .  Immune Globulin 10% (PRIVIGEN) IV infusion 45 g, 400 mg/kg, Intravenous, Q24 Hr x 5, Kerney Elbe, MD, Stopped at 02/05/20 0116 .  insulin aspart (novoLOG) injection 0-9 Units, 0-9 Units, Subcutaneous, TID WC, Rise Patience, MD, 2 Units at 02/05/20 [phone] .  ketorolac (TORADOL) 15 MG/ML injection 15 mg, 15 mg, Intravenous, Q6H PRN, Donne Hazel, MD, 15 mg at 02/05/20 0440 .  metoprolol tartrate (LOPRESSOR) tablet 50 mg, 50 mg, Oral, BID, Rise Patience, MD, 50 mg at 02/04/20 2103 .  tamsulosin (FLOMAX) capsule 0.4 mg, 0.4 mg, Oral, Daily, Rise Patience, MD, 0.4  mg at 02/04/20 1000 Labs  Pertinent labs-  CRP -3.0 Na- 133 Glucose- 194 Creatinine- 0.83 D dimer- 0.68  CBC    Component Value Date/Time   WBC 7.2 02/05/2020 0442   RBC 5.21 02/05/2020 0442   HGB 15.9 02/05/2020 0442   HCT 47.5 02/05/2020 0442   PLT 186 02/05/2020 0442   MCV 91.2 02/05/2020 0442   MCH 30.5 02/05/2020 0442   MCHC 33.5 02/05/2020 0442   RDW 12.3 02/05/2020 0442   LYMPHSABS 1.0 02/05/2020 0442   MONOABS 0.6 02/05/2020 0442   EOSABS 0.0  02/05/2020 0442   BASOSABS 0.0 02/05/2020 0442    CMP     Component Value Date/Time   NA 133 (L) 02/05/2020 0442   K 4.2 02/05/2020 0442   CL 98 02/05/2020 0442   CO2 28 02/05/2020 0442   GLUCOSE 194 (H) 02/05/2020 0442   BUN 24 (H) 02/05/2020 0442   CREATININE 0.83 02/05/2020 0442   CALCIUM 8.4 (L) 02/05/2020 0442   PROT 7.8 02/05/2020 0442   ALBUMIN 2.9 (L) 02/05/2020 0442   AST 23 02/05/2020 0442   ALT 22 02/05/2020 0442   ALKPHOS 64 02/05/2020 0442   BILITOT 1.6 (H) 02/05/2020 0442   GFRNONAA >60 02/05/2020 0442      Lipid Panel     Component Value Date/Time   CHOL 107 08/20/2019 1023   TRIG 74.0 08/20/2019 1023   HDL 41.00 08/20/2019 1023   CHOLHDL 3 08/20/2019 1023   VLDL 14.8 08/20/2019 1023   LDLCALC 52 08/20/2019 1023     Imaging I have reviewed images in epic and the results pertinent to this consultation are:  CT-scan of the brain No evidence of acute intracranial abnormality.  Mild paranasal sinus disease as described.  MRI examination of the brain Normal examination. No abnormality seen to explain the clinical Presentation.  MRI Cervical spine  1. No focal cord lesion. 2. Degenerative spondylosis as outlined above. Right foraminal narrowing at C5-6 that could affect the right C6 nerve. No other compressive canal or foraminal stenosis.  MRI Thoracic spine Negative examination. No evidence of cord compression or primary cord lesion. No compressive canal or foraminal stenosis. No evidence of inflammatory arthropathy. Some degenerative T2 signal in the lower thoracic discs, but without evidence of enhancement or endplate changes to suggest infection.  MRI Lumber spine 1. Smooth leptomeningeal and/or dural enhancement about the distal cord and conus medullaris, with extension about the distal thecal sac. While this finding could be reactive in nature related to recent LP attempts, a possible acute infectious or inflammatory process could  also have this appearance. Although several previous LP attempts have reportedly already been performed, a repeat attempt with CSF analysis suggested for further evaluation. 2. No other evidence for acute infection within the lumbar spine. 3. Mild soft tissue edema and enhancement within the posterior right greater than left paraspinous musculature at the level of L3-L5, most likely related to recent LP attempts. No discrete collections or evidence for CSF leak. 4. Multilevel degenerative spondylosis throughout the lumbar spine as detailed above. Resultant up to moderate spinal stenosis at the L2-3 through L4-5 levels as above. Associated moderate bilateral L3 through L5 foraminal narrowing.  NIF- -81 VC- 1.5 L  1/27 PM NIF -45 cmh20, VC 2.1 L/min  Assessment: Pt is a 65 year old male with PMH of DM, BPH, Glaucoma, HTN presented with weakness and paraesthesias of all extremities. He was also found to be COVID positive. He was started empirically on  IV antibodies on 1/25 for a total of 5 days. His symptoms are stable with stable NIF of -45 and VC of 1.5 L. MRI brain negative for acute abnormality. Pt had 2 failed attempts of LP. As per pt, fentanyl not working and need something for pain. We will continue IV IG for 5 days.  Recommendations: -Continue IV IG 1/25-1/29 -Pain control per primary team. -Dilaudid 0.5 mg PRN QID -Lidocaine patch for back pain.  -Can give Ibuprofen 400 mg PRN -Avoid muscle relaxants.  -Continue PT/OT -Continue RT for FVC and NIF qd. -Outpatient EMG after discharge.   Dr. Armando Reichert MD PGY1 [facility] Neurology  Attending Neurologist's note:  I personally saw this patient, gathering history, performing a full neurologic examination, reviewing relevant labs, and formulated the assessment and plan, adding the note above for completeness and clarity to accurately reflect my thoughts.  In brief, he appears to be improving and is having no side effects.  We  will continue IVIG as planned

## 2020-02-06 DIAGNOSIS — E669 Obesity, unspecified: Secondary | ICD-10-CM | POA: Diagnosis not present

## 2020-02-06 DIAGNOSIS — U071 COVID-19: Secondary | ICD-10-CM | POA: Diagnosis not present

## 2020-02-06 DIAGNOSIS — G629 Polyneuropathy, unspecified: Secondary | ICD-10-CM | POA: Diagnosis not present

## 2020-02-06 DIAGNOSIS — E1169 Type 2 diabetes mellitus with other specified complication: Secondary | ICD-10-CM | POA: Diagnosis not present

## 2020-02-06 DIAGNOSIS — I10 Essential (primary) hypertension: Secondary | ICD-10-CM | POA: Diagnosis not present

## 2020-02-06 LAB — D-DIMER, QUANTITATIVE: D-Dimer, Quant: 0.7 ug/mL-FEU — ABNORMAL HIGH (ref 0.00–0.50)

## 2020-02-06 LAB — COMPREHENSIVE METABOLIC PANEL
ALT: 21 U/L (ref 0–44)
AST: 23 U/L (ref 15–41)
Albumin: 2.7 g/dL — ABNORMAL LOW (ref 3.5–5.0)
Alkaline Phosphatase: 60 U/L (ref 38–126)
Anion gap: 8 (ref 5–15)
BUN: 22 mg/dL (ref 8–23)
CO2: 25 mmol/L (ref 22–32)
Calcium: 8.3 mg/dL — ABNORMAL LOW (ref 8.9–10.3)
Chloride: 98 mmol/L (ref 98–111)
Creatinine, Ser: 0.71 mg/dL (ref 0.61–1.24)
GFR, Estimated: >60 mL/min (ref >60)
Glucose, Bld: 200 mg/dL — ABNORMAL HIGH (ref 70–99)
Potassium: 4 mmol/L (ref 3.5–5.1)
Sodium: 131 mmol/L — ABNORMAL LOW (ref 135–145)
Total Bilirubin: 1.1 mg/dL (ref 0.3–1.2)
Total Protein: 7.6 g/dL (ref 6.5–8.1)

## 2020-02-06 LAB — CBC WITH DIFFERENTIAL/PLATELET
Abs Immature Granulocytes: 0.03 10*3/uL (ref 0.00–0.07)
Basophils Absolute: 0 10*3/uL (ref 0.0–0.1)
Basophils Relative: 0 %
Eosinophils Absolute: 0 10*3/uL (ref 0.0–0.5)
Eosinophils Relative: 0 %
HCT: 44.2 % (ref 39.0–52.0)
Hemoglobin: 14.8 g/dL (ref 13.0–17.0)
Immature Granulocytes: 0 %
Lymphocytes Relative: 14 %
Lymphs Abs: 1 10*3/uL (ref 0.7–4.0)
MCH: 30.3 pg (ref 26.0–34.0)
MCHC: 33.5 g/dL (ref 30.0–36.0)
MCV: 90.4 fL (ref 80.0–100.0)
Monocytes Absolute: 0.6 10*3/uL (ref 0.1–1.0)
Monocytes Relative: 8 %
Neutro Abs: 5.6 10*3/uL (ref 1.7–7.7)
Neutrophils Relative %: 78 %
Platelets: 181 10*3/uL (ref 150–400)
RBC: 4.89 MIL/uL (ref 4.22–5.81)
RDW: 12.1 % (ref 11.5–15.5)
WBC: 7.2 10*3/uL (ref 4.0–10.5)
nRBC: 0 % (ref 0.0–0.2)

## 2020-02-06 LAB — GLUCOSE, CAPILLARY
Glucose-Capillary: 161 mg/dL — ABNORMAL HIGH (ref 70–99)
Glucose-Capillary: 183 mg/dL — ABNORMAL HIGH (ref 70–99)
Glucose-Capillary: 218 mg/dL — ABNORMAL HIGH (ref 70–99)
Glucose-Capillary: 156 mg/dL — ABNORMAL HIGH (ref 70–99)
Glucose-Capillary: 185 mg/dL — ABNORMAL HIGH (ref 70–99)

## 2020-02-06 LAB — C-REACTIVE PROTEIN: CRP: 1.1 mg/dL — ABNORMAL HIGH (ref <1.0)

## 2020-02-06 MED ORDER — LIDOCAINE 5 % EX PTCH
1.0000 | MEDICATED_PATCH | CUTANEOUS | Status: DC
  Administered 2020-02-06: 1 via TRANSDERMAL
  Filled 2020-02-06 (×9): qty 1

## 2020-02-06 MED ORDER — HYDROMORPHONE HCL 1 MG/ML IJ SOLN
0.5000 mg | INTRAMUSCULAR | Status: DC | PRN
  Administered 2020-02-06 – 2020-02-09 (×17): 0.5 mg via INTRAVENOUS
  Filled 2020-02-06 (×17): qty 0.5

## 2020-02-06 NOTE — Progress Notes (Signed)
Physical Therapy Treatment Patient Details Name: Jon Townsend MRN: [id number] DOB: August 14, 1955 Today's Date: 02/06/2020    History of Present Illness 65 y.o. male with history of hypertension, hyperlipidemia, diabetes mellitus type 2 who has had Covid infection in March 2021 was given antibodies following which symptoms resolved has been having some upper respiratory tract symptoms last month. Patient started experiencing tingling and numbness of the upper extremities on Sunday February 01, 2020 which slowly progressed proximally and also started involving the lower extremities. Admitted for polyneuropathy workup.    PT Comments    Per chart review, pt may have Guillain-Barre syndrome. Pt is displaying possible progression of his condition as he required increased assistance for functional mobility this date compared to previous session. However, it may be due to his increased lower back pain, RN aware. In support, he was unable to come to a full standing position with even maxA today compared to standing with only modA previously. He required minA to sit up EOB and modAx1 with minAx1 from RN to squat pivot to the L from an elevated surface with R knee block. His R leg appears to be weaker compared to his L. Focused remainder of session on exercising legs to improve his independence with functional mobility, with pt easily fatiguing with each. Will continue to follow acutely. Current recommendations remain appropriate.  Follow Up Recommendations  CIR     Equipment Recommendations  Wheelchair (measurements PT);Wheelchair cushion (measurements PT);Hospital bed (mechanical lift, all if D/C home today)    Recommendations for Other Services       Precautions / Restrictions Precautions Precautions: Fall Precaution Comments: contact and airborne precautions Restrictions Weight Bearing Restrictions: No    Mobility  Bed Mobility Overal bed mobility: Needs Assistance Bed Mobility: Supine to  Sit     Supine to sit: HOB elevated;Min assist     General bed mobility comments: Pt initiating managing legs to L EOB and pulling on L bed rail with L hand but asked for R HHA to pull up to ascend trunk due to back pain.  Transfers Overall transfer level: Needs assistance Equipment used: 2 person hand held assist Transfers: Sit to/from [employer] Sit to Stand: Max assist   Squat pivot transfers: Mod assist;Min assist;+2 physical assistance;+2 safety/equipment;From elevated surface     General transfer comment: Pt able to initiate pushing up to stand, but min buttocks clearance and unable to come to full stand with maxA and R knee block. Assistance from [place] to transfer pt towards L elevated EOB > recliner with arm rest down via squat pivot with R knee block and 2 HHA with modAx1 from PT and minAx1 from RN to direct buttocks.  Ambulation/Gait             General Gait Details: Unable to do so safely this date   Stairs             Wheelchair Mobility    Modified Rankin (Stroke Patients Only)       Balance Overall balance assessment: Needs assistance Sitting-balance support: No upper extremity supported;Feet supported Sitting balance-Leahy Scale: Fair Sitting balance - Comments: Static sitting EOB, no LOB with supervision for safety.   Standing balance support: Bilateral upper extremity supported Standing balance-Leahy Scale: Poor Standing balance comment: ModAx1 and minAx1 for squat pivot, unable to obtain full upright standing position even with maxA.  Cognition Arousal/Alertness: Awake/alert Behavior During Therapy: WFL for tasks assessed/performed Overall Cognitive Status: Within Functional Limits for tasks assessed                                        Exercises General Exercises - Lower Extremity Long Arc Quad: Strengthening;Both;5 reps;Seated (concentric, isometric, and eccentric  conencetration, difficulty noted more with eccentric contraction) Hip ABduction/ADduction: Strengthening;Both;5 reps;Seated (pillow squeezes and hip abduction against manual resistance from PT) Hip Flexion/Marching: Strengthening;Both;5 reps;Seated Toe Raises: Strengthening;Both;10 reps;Seated Heel Raises: Strengthening;Both;10 reps;Seated Mini-Sqauts: Strengthening;Both;5 reps (min buttocks clearance in recliner, cuing pt to use UEs and LEs to strengthen all, cuign to scoot and lean anteriorly)    General Comments General comments (skin integrity, edema, etc.): Educated pt on performing exercises throughout day to tolerance without causing too much fatigue      Pertinent Vitals/Pain Pain Assessment: Faces Faces Pain Scale: Hurts whole lot Pain Location: lower back, R leg Pain Descriptors / Indicators: Tingling;Discomfort;Grimacing;Guarding Pain Intervention(s): Limited activity within patient's tolerance;Monitored during session;Repositioned;Patient requesting pain meds-RN notified    Home Living                      Prior Function            PT Goals (current goals can now be found in the care plan section) Acute Rehab PT Goals Patient Stated Goal: to return to independent mobility PT Goal Formulation: With patient Time For Goal Achievement: 02/19/20 Potential to Achieve Goals: Good Progress towards PT goals: Not progressing toward goals - comment (possible progression of the disease this date compared to previous session)    Frequency    Min 4X/week      PT Plan Current plan remains appropriate    Co-evaluation              AM-PAC PT "6 Clicks" Mobility   Outcome Measure  Help needed turning from your back to your side while in a flat bed without using bedrails?: A Lot Help needed moving from lying on your back to sitting on the side of a flat bed without using bedrails?: A Lot Help needed moving to and from a bed to a chair (including a  wheelchair)?: A Lot Help needed standing up from a chair using your arms (e.g., wheelchair or bedside chair)?: A Lot Help needed to walk in hospital room?: Total Help needed climbing 3-5 steps with a railing? : Total 6 Click Score: 10    End of Session Equipment Utilized During Treatment: Gait belt Activity Tolerance: Patient tolerated treatment well Patient left: in chair;with call bell/phone within reach;with chair alarm set Nurse Communication: Mobility status PT Visit Diagnosis: Other abnormalities of gait and mobility (R26.89);Muscle weakness (generalized) (M62.81);Other symptoms and signs involving the nervous system (R29.898);Unsteadiness on feet (R26.81);Difficulty in walking, not elsewhere classified (R26.2);Pain Pain - Right/Left:  (back) Pain - part of body:  (back)     Time: [id number] PT Time Calculation (min) (ACUTE ONLY): 38 min  Charges:  $Therapeutic Exercise: 23-37 mins $Therapeutic Activity: 8-22 mins                     Moishe Spice, PT, DPT Acute Rehabilitation Services  Pager: [phone] Office: [phone]    Orvan Falconer 02/06/2020, 10:11 AM

## 2020-02-06 NOTE — Progress Notes (Signed)
PROGRESS NOTE    Zhaire Whitehorn  [id number] DOB: 05/19/1955 DOA: 02/03/2020 PCP: [name], DO    Brief Narrative:  65 y.o. male with history of hypertension, hyperlipidemia, diabetes mellitus type 2 who has had Covid infection in March 2021 was given antibodies following which symptoms resolved has been having some upper respiratory tract symptoms last month for which patient received prednisone antibiotics and also last week for which again patient received prednisone.  Patient started experiencing tingling and numbness of the upper extremities on Sunday February 01, 2020 which slowly progressed proximally and also started involving the lower extremities.  At that point patient came to the ER was evaluated by neurologist and outpatient further work-up was recommended.  But since patient's symptoms got more worse with weakness of the lower extremities patient decided to come to the ER.  Patient also has been having low back pain denies any fever chills or any incontinence of urine or bowel.  Assessment & Plan:   Principal Problem:   Polyneuropathy Active Problems:   Diabetes mellitus type 2 in obese (HCC)   Weakness   Essential hypertension   COVID-19 virus infection  1. Guillain-Barr syndrome/polyneuropathy 1. Nephrology following 2. Pt is continued on IVIG per Neurology recs, will complete total 5 days 2. Low back pain  1. MRI L-spine reviewed. Findings consistent with prior LP attempts noted 2. Little to no benefit with fentanyl IV 3. Will give trial of heat pack and toradol 4. PT/OT consulted thus far with recs for CIR. However, per CIR, pt will need to be >20 days from pos covid test with recovery in symptoms or 2 negative tests before considered for inpt CIR 5. Discussed with Neurology. Recommendation to avoid muscle relaxants. Instead, continue on PRN dilaudid with NSAIDs as needed and lidocaine patch  6. Still complaining of pain. Will increase frequency of  diaudid from once daily PRN to q4hrs PRN 3. Covid infection positive but patient is asymptomatic.   1. Will follow inflammatory markers 2. CRP down to 1.1, pt remains asymptomatic 4. Diabetes mellitus type 2 1.  Will continue SSI coverage as needed. 5. Hypertension 1. continue on amlodipine and beta-blockers as tolerated 6. Hyperlipidemia on statins. 7. BPH, continued on Flomax.  DVT prophylaxis: SCD's Code Status: Full Family Communication: Pt in room, family not at bedside  Status is: Inpatient  Remains inpatient appropriate because:IV treatments appropriate due to intensity of illness or inability to take PO and Inpatient level of care appropriate due to severity of illness  Dispo: The patient is from: Home              Anticipated d/c is to: Home              Anticipated d/c date is: 3 days              Patient currently is not medically stable to d/c.   Difficult to place patient No   Consultants:   Neurology  Procedures:     Antimicrobials: Anti-infectives (From admission, onward)   None      Subjective: Reports back pains improved with dilaudid  Objective: Vitals:   02/06/20 0408 02/06/20 0730 02/06/20 1140 02/06/20 1146  BP: (!) 145/78 (!) 150/80 (!) 155/85 (!) 145/77  Pulse: 65 65 72 72  Resp: 18 18 18 17   Temp: 97.8 F (36.6 C) 97.8 F (36.6 C) 97.7 F (36.5 C) 98.1 F (36.7 C)  TempSrc: Oral Oral Oral Oral  SpO2: 98% 99% 97% 91%  Weight:      Height:        Intake/Output Summary (Last 24 hours) at 02/06/2020 1641 Last data filed at 02/06/2020 1459 Gross per 24 hour  Intake 540 ml  Output 1600 ml  Net -1060 ml   Filed Weights   02/03/20 1228  Weight: 113.4 kg    Examination: General exam: Conversant, in no acute distress Respiratory system: normal chest rise, clear, no audible wheezing Cardiovascular system: regular rhythm, s1-s2 Gastrointestinal system: Nondistended, nontender, pos BS Central nervous system: No seizures, no  tremors Extremities: No cyanosis, no joint deformities Skin: No rashes, no pallor Psychiatry: Affect normal // no auditory hallucinations   Data Reviewed: I have personally reviewed following labs and imaging studies  CBC: Recent Labs  Lab 02/02/20 0800 02/03/20 1734 02/04/20 0140 02/05/20 0442 02/06/20 0539  WBC 10.4 10.1 10.8* 7.2 7.2  NEUTROABS 7.9* 7.7 8.9* 5.5 5.6  HGB 16.3 16.3 16.2 15.9 14.8  HCT 47.4 50.6 46.4 47.5 44.2  MCV 91.3 95.5 89.9 91.2 90.4  PLT 200 183 208 186 706   Basic Metabolic Panel: Recent Labs  Lab 02/02/20 0800 02/03/20 1738 02/04/20 0140 02/05/20 0442 02/06/20 0539  NA 139 134* 133* 133* 131*  K 3.3* 4.3 4.0 4.2 4.0  CL 101 101 97* 98 98  CO2 27 21* 26 28 25   GLUCOSE 152* 172* 162* 194* 200*  BUN 16 27* 24* 24* 22  CREATININE 0.95 0.79 0.85 0.83 0.71  CALCIUM 9.0 8.5* 8.3* 8.4* 8.3*   GFR: Estimated Creatinine Clearance: 119.9 mL/min (by C-G formula based on SCr of 0.71 mg/dL). Liver Function Tests: Recent Labs  Lab 02/02/20 0800 02/03/20 1738 02/04/20 0140 02/05/20 0442 02/06/20 0539  AST 15 28 21 23 23   ALT 19 16 21 22 21   ALKPHOS 77 74 72 64 60  BILITOT 1.5* 1.7* 1.5* 1.6* 1.1  PROT 6.6 6.0* 6.3* 7.8 7.6  ALBUMIN 3.5 3.3* 3.3* 2.9* 2.7*   No results for input(s): LIPASE, AMYLASE in the last 168 hours. No results for input(s): AMMONIA in the last 168 hours. Coagulation Profile: No results for input(s): INR, PROTIME in the last 168 hours. Cardiac Enzymes: Recent Labs  Lab 02/02/20 1556 02/03/20 1738  CKTOTAL 96 216   BNP (last 3 results) No results for input(s): PROBNP in the last 8760 hours. HbA1C: Recent Labs    02/03/20 1734  HGBA1C 7.7*   CBG: Recent Labs  Lab 02/05/20 1818 02/05/20 2110 02/06/20 0621 02/06/20 0735 02/06/20 1137  GLUCAP 172* 196* 161* 183* 218*   Lipid Profile: No results for input(s): CHOL, HDL, LDLCALC, TRIG, CHOLHDL, LDLDIRECT in the last 72 hours. Thyroid Function Tests: Recent  Labs    02/04/20 0140  TSH 1.120   Anemia Panel: No results for input(s): VITAMINB12, FOLATE, FERRITIN, TIBC, IRON, RETICCTPCT in the last 72 hours. Sepsis Labs: No results for input(s): PROCALCITON, LATICACIDVEN in the last 168 hours.  No results found for this or any previous visit (from the past 240 hour(s)).   Radiology Studies: No results found.  Scheduled Meds: . amLODipine  5 mg Oral Daily  . atorvastatin  40 mg Oral QHS  . insulin aspart  0-9 Units Subcutaneous TID WC  . lidocaine  1 patch Transdermal Q24H  . metoprolol tartrate  50 mg Oral BID  . tamsulosin  0.4 mg Oral Daily   Continuous Infusions: . Immune Globulin 10% 45 g (02/05/20 2234)     LOS: 3 days   Marylu Lund, MD  [facility] Pager On [place]  If 7PM-7AM, please contact night-coverage 02/06/2020, 4:41 PM

## 2020-02-06 NOTE — Progress Notes (Addendum)
Neurology Progress Note   S:// Pt states that he is having a lot of back pain which is not controlled by Fentanyl. Pt states once time dose of Dilaudid is not enough. Still has tingling sensation in the hands and legs. States his weakness is not getting worse. Patient states he is interested in getting Covid vaccine if he is able to.   O:// Current vital signs: BP (!) 145/78 (BP Location: Left Arm)   Pulse 65   Temp 97.8 F (36.6 C) (Oral)   Resp 18   Ht 6\' 1"  (1.854 m)   Wt 113.4 kg   SpO2 98%   BMI 32.98 kg/m  Vital signs in last 24 hours: Temp:  [97.6 F (36.4 C)-98.6 F (37 C)] 97.8 F (36.6 C) (01/28 0408) Pulse Rate:  [62-73] 65 (01/28 0408) Resp:  [16-20] 18 (01/28 0408) BP: (133-166)/(78-133) 145/78 (01/28 0408) SpO2:  [97 %-100 %] 98 % (01/28 0408) GENERAL: Awake, alert in NAD, oriented to time, place, person, situation.  Patient knows the date month and year HEENT: - Normocephalic and atraumatic.  LUNGS - Symmetrical chest rise, No labored breathing noted.  CV - no JVD, No Peripheral Edema ABDOMEN - Soft,  nondistended  Ext: warm, well perfused, intact peripheral pulses, no Peripheral edema.  NEURO:  Mental Status: AA&Ox4, Oriented to self, age, place, and situation.  Good attention and Concentration Language: speech is fluent.   repetition intact,  fluency, and comprehension intact. Cranial Nerves: PERRL, EOMI, visual fields full, no facial asymmetry, facial sensation intact, hearing intact, tongue/uvula/soft palate midline, normal sternocleidomastoid and trapezius muscle strength. No evidence of tongue atrophy or fibrillations.  Motor: No Drift in Upper Extremities, Pt able to lift both lower extremities for 5-6 seconds,  Strength is 4/5 in Rt UE, 4/5 in Lt UE, Strength in LE's 3+/5.  Reflexes- UE -0  , LE-  0, [place]- Down going Tone: is normal and bulk is normal Sensation- Intact to light touch bilaterally.  Coordination: FTN intact bilaterally, no ataxia in  BLE. Gait- deferred  Medications  Current Facility-Administered Medications:  .  acetaminophen (TYLENOL) tablet 650 mg, 650 mg, Oral, Q6H PRN, 650 mg at 02/06/20 0900 **OR** acetaminophen (TYLENOL) suppository 650 mg, 650 mg, Rectal, Q6H PRN, Rise Patience, MD .  amLODipine (NORVASC) tablet 5 mg, 5 mg, Oral, Daily, Rise Patience, MD, 5 mg at 02/06/20 0900 .  atorvastatin (LIPITOR) tablet 40 mg, 40 mg, Oral, QHS, Rise Patience, MD, 40 mg at 02/05/20 2122 .  fentaNYL (SUBLIMAZE) injection 25 mcg, 25 mcg, Intravenous, Q2H PRN, Rise Patience, MD, 25 mcg at 02/06/20 0506 .  HYDROmorphone (DILAUDID) injection 0.5 mg, 0.5 mg, Intravenous, Daily PRN, Donne Hazel, MD, 0.5 mg at 02/05/20 1446 .  Immune Globulin 10% (PRIVIGEN) IV infusion 45 g, 400 mg/kg, Intravenous, Q24 Hr x 5, Lindzen, Eric, MD, Last Rate: 34 mL/hr at 02/05/20 2234, 45 g at 02/05/20 2234 .  insulin aspart (novoLOG) injection 0-9 Units, 0-9 Units, Subcutaneous, TID WC, Rise Patience, MD, 2 Units at 02/06/20 [phone] .  ketorolac (TORADOL) 15 MG/ML injection 15 mg, 15 mg, Intravenous, Q6H PRN, Donne Hazel, MD, 15 mg at 02/05/20 1857 .  lidocaine (LIDODERM) 5 % 1 patch, 1 patch, Transdermal, Q24H, Donne Hazel, MD, 1 patch at 02/06/20 1030 .  metoprolol tartrate (LOPRESSOR) tablet 50 mg, 50 mg, Oral, BID, Rise Patience, MD, 50 mg at 02/06/20 0900 .  tamsulosin (FLOMAX) capsule 0.4 mg, 0.4 mg,  Oral, Daily, Rise Patience, MD, 0.4 mg at 02/06/20 0900 Labs CBC    Component Value Date/Time   WBC 7.2 02/06/2020 0539   RBC 4.89 02/06/2020 0539   HGB 14.8 02/06/2020 0539   HCT 44.2 02/06/2020 0539   PLT 181 02/06/2020 0539   MCV 90.4 02/06/2020 0539   MCH 30.3 02/06/2020 0539   MCHC 33.5 02/06/2020 0539   RDW 12.1 02/06/2020 0539   LYMPHSABS 1.0 02/06/2020 0539   MONOABS 0.6 02/06/2020 0539   EOSABS 0.0 02/06/2020 0539   BASOSABS 0.0 02/06/2020 0539    CMP     Component Value  Date/Time   NA 131 (L) 02/06/2020 0539   K 4.0 02/06/2020 0539   CL 98 02/06/2020 0539   CO2 25 02/06/2020 0539   GLUCOSE 200 (H) 02/06/2020 0539   BUN 22 02/06/2020 0539   CREATININE 0.71 02/06/2020 0539   CALCIUM 8.3 (L) 02/06/2020 0539   PROT 7.6 02/06/2020 0539   ALBUMIN 2.7 (L) 02/06/2020 0539   AST 23 02/06/2020 0539   ALT 21 02/06/2020 0539   ALKPHOS 60 02/06/2020 0539   BILITOT 1.1 02/06/2020 0539   GFRNONAA >60 02/06/2020 0539    glycosylated hemoglobin  Lipid Panel     Component Value Date/Time   CHOL 107 08/20/2019 1023   TRIG 74.0 08/20/2019 1023   HDL 41.00 08/20/2019 1023   CHOLHDL 3 08/20/2019 1023   VLDL 14.8 08/20/2019 1023   LDLCALC 52 08/20/2019 1023     Imaging I have reviewed images in epic and the results pertinent to this consultation are:  CT-scan of the brain No evidence of acute intracranial abnormality.  Mild paranasal sinus disease as described.  MRI examination of the brain Normal examination. No abnormality seen to explain the clinical Presentation.  MRI Cervical spine  1. No focal cord lesion. 2. Degenerative spondylosis as outlined above. Right foraminal narrowing at C5-6 that could affect the right C6 nerve. No other compressive canal or foraminal stenosis.  MRI Thoracic spine Negative examination. No evidence of cord compression or primary cord lesion. No compressive canal or foraminal stenosis. No evidence of inflammatory arthropathy. Some degenerative T2 signal in the lower thoracic discs, but without evidence of enhancement or endplate changes to suggest infection.  MRI Lumber spine 1. Smooth leptomeningeal and/or dural enhancement about the distal cord and conus medullaris, with extension about the distal thecal sac. While this finding could be reactive in nature related to recent LP attempts, a possible acute infectious or inflammatory process could also have this appearance. Although several previous LP  attempts have reportedly already been performed, a repeat attempt with CSF analysis suggested for further evaluation. 2. No other evidence for acute infection within the lumbar spine. 3. Mild soft tissue edema and enhancement within the posterior right greater than left paraspinous musculature at the level of L3-L5, most likely related to recent LP attempts. No discrete collections or evidence for CSF leak. 4. Multilevel degenerative spondylosis throughout the lumbar spine as detailed above. Resultant up to moderate spinal stenosis at the L2-3 through L4-5 levels as above. Associated moderate bilateral L3 through L5 foraminal narrowing.  1/28- NIF - -40 cm H20, VC 2.7 L/min 1/27 PM NIF -45 cmh20, VC 2.1 L/min  Assessment: Pt is a 65 year old male with PMH of DM, BPH, Glaucoma, HTN presented with weakness and paraesthesias of all extremities. He was also found to be COVID positive. He was started empirically on IV antibodies on 1/25 for a total  of 5 days. His symptoms are stable with stable NIF of -40 and VC of 2.7 L. MRI brain negative for acute abnormality. Pt had 2 failed attempts of LP. As per pt, fentanyl not working and 1 dose of Dilaudid is not enough. Pt demands something for pain. We will continue IV IG for 5 days. Pt got 3 doses of IVIG and today will be 4th dose. Need better control of back pain. We will defer pain control to primary team. We'll investigate about when he can get COVID vaccine safely.   Recommendations: -Continue IV IG 1/25-1/29 -Pain control per primary team.  -Can consider Lidocaine patch for back pain.  - Avoid muscle relaxants.  -Continue PT/OT -Continue RT for FVC and NIF qd. -Outpatient EMG after discharge.   Dr. Armando Reichert MD PGY1 [facility] Neurology  Attending Neurologist's note:  I personally saw this patient, gathering history, performing a neurologic examination, reviewing relevant labs, and formulated the assessment and plan, adding the note  above for completeness and clarity to accurately reflect my thoughts  Patient continues to have a variable sensory examination, but certainly is not worsening.  Strength remains stable, NIF/FVC are stable to improving.  There continues to be mild effort dependent quality to his exam versus potentially some fatigability, possibly due to the concurrent COVID19 infection. Appreciate primary team's attention to his pain management.  Per CDC guidelines: "For people receiving antibody products not specific to COVID-19 treatment (e.g., intravenous immunoglobulin, RhoGAM), administration of COVID-19 vaccines either simultaneously with or at any interval before or after receipt of an antibody-containing product is unlikely to substantially impair development of a protective antibody response. Thus, there is no recommended minimum interval between antibody therapies not specific to COVID-19 treatment and COVID-19 vaccination." GemMerchandise.ch.html?http://horn.org/.html  From a neurological perspective, patient is cleared for COVID-19 vaccination, continue to recommend an mRNA vaccine Press photographer) over J&J given the latter's association with GBS  Given the patient's clinical course and stability, neurology will sign off at this time but we will available on an as-needed basis should new neurological issues or questions arise.  Anticipate that the patient will be ready for rehab placement after completing IVIG treatment tomorrow  Lesleigh Noe MD-PhD [facility] [phone]

## 2020-02-06 NOTE — Progress Notes (Signed)
Sent note to Dr. Wyline Copas:  Jon Townsend Morning! Pt. continues to complain of lower back pain. Would you please put in an order for a Lidoderm (lidocaine) patch?

## 2020-02-06 NOTE — Progress Notes (Signed)
NIF -45, VC 2.375L, good effort, sitting up.

## 2020-02-06 NOTE — Progress Notes (Addendum)
NIF -45/VC 2.4L with good effort.

## 2020-02-07 DIAGNOSIS — U071 COVID-19: Secondary | ICD-10-CM | POA: Diagnosis not present

## 2020-02-07 DIAGNOSIS — I10 Essential (primary) hypertension: Secondary | ICD-10-CM | POA: Diagnosis not present

## 2020-02-07 LAB — CBC WITH DIFFERENTIAL/PLATELET
Abs Immature Granulocytes: 0.03 10*3/uL (ref 0.00–0.07)
Basophils Absolute: 0.1 10*3/uL (ref 0.0–0.1)
Basophils Relative: 1 %
Eosinophils Absolute: 0.1 10*3/uL (ref 0.0–0.5)
Eosinophils Relative: 1 %
HCT: 43.1 % (ref 39.0–52.0)
Hemoglobin: 15.2 g/dL (ref 13.0–17.0)
Immature Granulocytes: 0 %
Lymphocytes Relative: 15 %
Lymphs Abs: 1.1 10*3/uL (ref 0.7–4.0)
MCH: 31.3 pg (ref 26.0–34.0)
MCHC: 35.3 g/dL (ref 30.0–36.0)
MCV: 88.9 fL (ref 80.0–100.0)
Monocytes Absolute: 0.7 10*3/uL (ref 0.1–1.0)
Monocytes Relative: 10 %
Neutro Abs: 5.4 10*3/uL (ref 1.7–7.7)
Neutrophils Relative %: 73 %
Platelets: 200 10*3/uL (ref 150–400)
RBC: 4.85 MIL/uL (ref 4.22–5.81)
RDW: 12.2 % (ref 11.5–15.5)
WBC: 7.3 10*3/uL (ref 4.0–10.5)
nRBC: 0 % (ref 0.0–0.2)

## 2020-02-07 LAB — COMPREHENSIVE METABOLIC PANEL
ALT: 22 U/L (ref 0–44)
AST: 23 U/L (ref 15–41)
Albumin: 2.8 g/dL — ABNORMAL LOW (ref 3.5–5.0)
Alkaline Phosphatase: 57 U/L (ref 38–126)
Anion gap: 6 (ref 5–15)
BUN: 20 mg/dL (ref 8–23)
CO2: 24 mmol/L (ref 22–32)
Calcium: 8.3 mg/dL — ABNORMAL LOW (ref 8.9–10.3)
Chloride: 96 mmol/L — ABNORMAL LOW (ref 98–111)
Creatinine, Ser: 0.67 mg/dL (ref 0.61–1.24)
GFR, Estimated: >60 mL/min (ref >60)
Glucose, Bld: 206 mg/dL — ABNORMAL HIGH (ref 70–99)
Potassium: 3.9 mmol/L (ref 3.5–5.1)
Sodium: 126 mmol/L — ABNORMAL LOW (ref 135–145)
Total Bilirubin: 1.3 mg/dL — ABNORMAL HIGH (ref 0.3–1.2)
Total Protein: 8.5 g/dL — ABNORMAL HIGH (ref 6.5–8.1)

## 2020-02-07 LAB — GLUCOSE, CAPILLARY
Glucose-Capillary: 169 mg/dL — ABNORMAL HIGH (ref 70–99)
Glucose-Capillary: 281 mg/dL — ABNORMAL HIGH (ref 70–99)
Glucose-Capillary: 245 mg/dL — ABNORMAL HIGH (ref 70–99)
Glucose-Capillary: 188 mg/dL — ABNORMAL HIGH (ref 70–99)
Glucose-Capillary: 176 mg/dL — ABNORMAL HIGH (ref 70–99)

## 2020-02-07 LAB — D-DIMER, QUANTITATIVE: D-Dimer, Quant: 0.9 ug/mL-FEU — ABNORMAL HIGH (ref 0.00–0.50)

## 2020-02-07 LAB — C-REACTIVE PROTEIN: CRP: 0.7 mg/dL (ref <1.0)

## 2020-02-07 MED ORDER — MELATONIN 5 MG PO TABS
10.0000 mg | ORAL_TABLET | Freq: Every evening | ORAL | Status: DC | PRN
  Administered 2020-02-07 – 2020-02-18 (×8): 10 mg via ORAL
  Filled 2020-02-07 (×9): qty 2

## 2020-02-07 MED ORDER — GABAPENTIN 100 MG PO CAPS
100.0000 mg | ORAL_CAPSULE | Freq: Two times a day (BID) | ORAL | Status: DC
  Administered 2020-02-07 – 2020-02-18 (×22): 100 mg via ORAL
  Filled 2020-02-07 (×22): qty 1

## 2020-02-07 MED ORDER — ALUM & MAG HYDROXIDE-SIMETH 200-200-20 MG/5ML PO SUSP
30.0000 mL | Freq: Once | ORAL | Status: AC
  Administered 2020-02-13: 30 mL via ORAL
  Filled 2020-02-07: qty 30

## 2020-02-07 NOTE — Progress Notes (Signed)
Inpatient Rehab Admissions Coordinator Note:   Per therapy recommendations, pt was screened for CIR candidacy by Clemens Catholic, [place] CCC-SLP. At this time, Pt. Appears to have functional decline and is a good candidate for CIR. Clarified with MD that CIR policy regarding COVID+ is as follows: Patients are eligible to be considered for admit to the [facility] when cleared from airborne precautions by acute MD or Infectious disease, regardless of onset day. Otherwise they will need to be >20 days from their positive test with recovery/improvement in symptoms (2/14) or 2 negative tests. Please contact me with any questions.    Clemens Catholic, [place], [place] Admissions Coordinator  [phone] ([place]) [phone] (office)

## 2020-02-07 NOTE — Progress Notes (Signed)
NIF-58,  VC: 2.24L Sitting up, good effort noted.

## 2020-02-07 NOTE — Care Management (Cosign Needed)
    Durable Medical Equipment  (From admission, onward)         Start     Ordered   02/07/20 1206  For home use only DME Other see comment  Once       Comments: Harrel Lemon  Question:  Length of Need  Answer:  12 Months   02/07/20 1205   02/07/20 1200  For home use only DME lightweight manual wheelchair with seat cushion  Once       Comments: Patient suffers from weakness and numbness which impairs their ability to perform daily activities like walking in the home.  A walker will not resolve  issue with performing activities of daily living. A lightweight wheelchair will allow patient to safely perform daily activities. Patient is not able to propel themselves in the home using a standard weight wheelchair due to weakness. Patient can self propel in the lightweight wheelchair. Length of need lifetime. Accessories: elevating leg rests (ELRs), wheel locks, extensions and anti-tippers.   02/07/20 1205

## 2020-02-07 NOTE — Progress Notes (Signed)
NIF -45 VC: Pt attempted VC with good effort, but VC was broken. No other device available at this time.

## 2020-02-07 NOTE — Progress Notes (Signed)
PROGRESS NOTE    Jon Townsend  [id number] DOB: [birth year] DOA: 02/03/2020 PCP: [name], DO    Brief Narrative:  65 y.o. male with history of hypertension, hyperlipidemia, diabetes mellitus type 2 who has had Covid infection in March 2021 was given antibodies following which symptoms resolved has been having some upper respiratory tract symptoms last month for which patient received prednisone antibiotics and also last week for which again patient received prednisone.  Patient started experiencing tingling and numbness of the upper extremities on Sunday February 01, 2020 which slowly progressed proximally and also started involving the lower extremities.  At that point patient came to the ER was evaluated by neurologist and outpatient further work-up was recommended.  But since patient's symptoms got more worse with weakness of the lower extremities patient decided to come to the ER.  Patient also has been having low back pain denies any fever chills or any incontinence of urine or bowel.  Assessment & Plan:   Principal Problem:   Polyneuropathy Active Problems:   Diabetes mellitus type 2 in obese (HCC)   Weakness   Essential hypertension   COVID-19 virus infection  1. Guillain-Barr syndrome/polyneuropathy 1. Nephrology had been following, now signed off 2. Pt is continued on IVIG per Neurology recs, will complete total 5 days 2. Low back pain  1. MRI L-spine reviewed. Findings consistent with prior LP attempts noted 2. Little to no benefit with fentanyl IV 3. Will give trial of heat pack and toradol 4. PT/OT consulted thus far with recs for CIR. CIR following 5. Discussed with Neurology. Recommendation to avoid muscle relaxants. Instead, continue on PRN dilaudid with NSAIDs as needed and lidocaine patch  6. On dilaudid q4hrs PRN 7. Will give trial of low dose neurontin 3. Covid infection positive but patient is asymptomatic.   1. Will follow inflammatory  markers 2. CRP down to 0.7, pt remains asymptomatic 4. Diabetes mellitus type 2 1.  Will continue SSI coverage as needed. 5. Hypertension 1. continue on amlodipine and beta-blockers as tolerated 6. Hyperlipidemia on statins. 7. BPH, will continue on Flomax.  DVT prophylaxis: SCD's Code Status: Full Family Communication: Pt in room, family not at bedside  Status is: Inpatient  Remains inpatient appropriate because:IV treatments appropriate due to intensity of illness or inability to take PO and Inpatient level of care appropriate due to severity of illness  Dispo: The patient is from: Home              Anticipated d/c is to: Home              Anticipated d/c date is: 3 days              Patient currently is not medically stable to d/c.   Difficult to place patient No   Consultants:   Neurology  Procedures:     Antimicrobials: Anti-infectives (From admission, onward)   None      Subjective: Complains of continued back pains  Objective: Vitals:   02/07/20 0331 02/07/20 0828 02/07/20 1204 02/07/20 1637  BP: (!) 151/88 (!) 170/84 (!) 160/82 (!) 152/83  Pulse: 77 74 79 76  Resp: 18 18 16 16   Temp: 97.9 F (36.6 C) 98.2 F (36.8 C) 98 F (36.7 C) 97.6 F (36.4 C)  TempSrc: Oral Oral Oral Oral  SpO2: 100% 97% 96% 96%  Weight:      Height:        Intake/Output Summary (Last 24 hours) at 02/07/2020 1658 Last  data filed at 02/07/2020 1600 Gross per 24 hour  Intake 120 ml  Output 975 ml  Net -855 ml   Filed Weights   02/03/20 1228  Weight: 113.4 kg    Examination: General exam: Awake, laying in bed, in nad Respiratory system: Normal respiratory effort, no wheezing Cardiovascular system: regular rate, s1, s2 Gastrointestinal system: Soft, nondistended, positive BS Central nervous system: CN2-12 grossly intact, strength intact Extremities: Perfused, no clubbing Skin: Normal skin turgor, no notable skin lesions seen Psychiatry: Mood normal // no visual  hallucinations   Data Reviewed: I have personally reviewed following labs and imaging studies  CBC: Recent Labs  Lab 02/03/20 1734 02/04/20 0140 02/05/20 0442 02/06/20 0539 02/07/20 0214  WBC 10.1 10.8* 7.2 7.2 7.3  NEUTROABS 7.7 8.9* 5.5 5.6 5.4  HGB 16.3 16.2 15.9 14.8 15.2  HCT 50.6 46.4 47.5 44.2 43.1  MCV 95.5 89.9 91.2 90.4 88.9  PLT 183 208 186 181 664   Basic Metabolic Panel: Recent Labs  Lab 02/03/20 1738 02/04/20 0140 02/05/20 0442 02/06/20 0539 02/07/20 0214  NA 134* 133* 133* 131* 126*  K 4.3 4.0 4.2 4.0 3.9  CL 101 97* 98 98 96*  CO2 21* 26 28 25 24   GLUCOSE 172* 162* 194* 200* 206*  BUN 27* 24* 24* 22 20  CREATININE 0.79 0.85 0.83 0.71 0.67  CALCIUM 8.5* 8.3* 8.4* 8.3* 8.3*   GFR: Estimated Creatinine Clearance: 119.9 mL/min (by C-G formula based on SCr of 0.67 mg/dL). Liver Function Tests: Recent Labs  Lab 02/03/20 1738 02/04/20 0140 02/05/20 0442 02/06/20 0539 02/07/20 0214  AST 28 21 23 23 23   ALT 16 21 22 21 22   ALKPHOS 74 72 64 60 57  BILITOT 1.7* 1.5* 1.6* 1.1 1.3*  PROT 6.0* 6.3* 7.8 7.6 8.5*  ALBUMIN 3.3* 3.3* 2.9* 2.7* 2.8*   No results for input(s): LIPASE, AMYLASE in the last 168 hours. No results for input(s): AMMONIA in the last 168 hours. Coagulation Profile: No results for input(s): INR, PROTIME in the last 168 hours. Cardiac Enzymes: Recent Labs  Lab 02/02/20 1556 02/03/20 1738  CKTOTAL 96 216   BNP (last 3 results) No results for input(s): PROBNP in the last 8760 hours. HbA1C: No results for input(s): HGBA1C in the last 72 hours. CBG: Recent Labs  Lab 02/06/20 2218 02/07/20 0824 02/07/20 1226 02/07/20 1235 02/07/20 1629  GLUCAP 185* 169* 281* 245* 188*   Lipid Profile: No results for input(s): CHOL, HDL, LDLCALC, TRIG, CHOLHDL, LDLDIRECT in the last 72 hours. Thyroid Function Tests: No results for input(s): TSH, T4TOTAL, FREET4, T3FREE, THYROIDAB in the last 72 hours. Anemia Panel: No results for  input(s): VITAMINB12, FOLATE, FERRITIN, TIBC, IRON, RETICCTPCT in the last 72 hours. Sepsis Labs: No results for input(s): PROCALCITON, LATICACIDVEN in the last 168 hours.  No results found for this or any previous visit (from the past 240 hour(s)).   Radiology Studies: No results found.  Scheduled Meds: . alum & mag hydroxide-simeth  30 mL Oral Once  . amLODipine  5 mg Oral Daily  . atorvastatin  40 mg Oral QHS  . gabapentin  100 mg Oral BID  . insulin aspart  0-9 Units Subcutaneous TID WC  . lidocaine  1 patch Transdermal Q24H  . metoprolol tartrate  50 mg Oral BID  . tamsulosin  0.4 mg Oral Daily   Continuous Infusions: . Immune Globulin 10% 45 g (02/06/20 2233)     LOS: 4 days   Marylu Lund, MD [facility] Pager On Amion  If 7PM-7AM, please contact night-coverage 02/07/2020, 4:58 PM

## 2020-02-07 NOTE — Progress Notes (Signed)
Inpatient Rehab Admissions Coordinator:   I spoke with acute MD who states that pt. Is asymptomatic for COVID and can likely come off of airborne precautions tomorrow. I will place CIR consult order per protocol.   Clemens Catholic, [place], [place] Admissions Coordinator  [phone] ([place]) [phone] (office)

## 2020-02-07 NOTE — Plan of Care (Signed)

## 2020-02-07 NOTE — Progress Notes (Signed)
In morning shift assessment, patient reported progression of numb/tingling from legs into lower abdomen beginning some time yesterday. No change since yesterday. Patient educated to report any change in sensation and/or changes in respiratory function immediately to RN/medical team. Patient stated understanding.

## 2020-02-07 NOTE — TOC Initial Note (Addendum)
Transition of Care [facility]) - Initial/Assessment Note    Patient Details  Name: Jon Townsend MRN: [id number] Date of Birth: 07-23-1955  [facility]) CM/SW Contact:    Carles Collet, RN Phone Number: 02/07/2020, 12:12 PM  Clinical Narrative:                Spoke w patient. Discussed DC choices, IR, SNF and HH. Discussed differences between the three. Patient does not want to go to SNF. He wants to go home w [facility] services. He understands they will only come for approx 1 hour long visits, and that he may home for 48 hours before they start services.  He states that he will have the help of his wife, she uses a cane at times for a hip replacement she had 3 months ago. CM reviewed private duty services, and if needed would be set up by patient/ family. He states that he understands limitations of HH but does not want to go SNF.  He would like [facility], they have accepted referral.  DME at home: urinal, RW, transport chair, 3/1, shower seat (from his mother) DME ordered WC, and hoyer, declined hospital bed. DME should be delivered to the house tomorrow by [employer].   Patient will need [facility] transport, he states that his wife will be home to accept him home.   Update: Spoke w CIR liaison, expedited case, and patient will be considered for CIR Monday. CIR would be a far better DC plan.    Expected Discharge Plan: [place] Barriers to Discharge: Continued Medical Work up   Patient Goals and CMS Choice Patient states their goals for this hospitalization and ongoing recovery are:: to go home CMS Medicare.gov Compare Post Acute Care list provided to:: Patient Choice offered to / list presented to : Patient  Expected Discharge Plan and Services Expected Discharge Plan: [place]   Discharge Planning Services: CM Consult Post Acute Care Choice: Home Health,Durable Medical Equipment                   DME Arranged: Other see comment,Lightweight manual  wheelchair with seat cushion DME Agency: [employer] Date DME Agency Contacted: 02/07/20 Time DME Agency Contacted: 1212 Representative spoke with at DME Agency: [place]: RN,PT,OT,[place] [place]: [place] Date [place]: 02/07/20 Time [place]: 1212 Representative spoke with at [place]: [place] Arrangements/Services   Lives with:: Spouse              Current home services: DME    Activities of Daily Living Home Assistive Devices/Equipment: None ADL Screening (condition at time of admission) Patient's cognitive ability adequate to safely complete daily activities?: Yes Is the patient deaf or have difficulty hearing?: No Does the patient have difficulty seeing, even when wearing glasses/contacts?: No Does the patient have difficulty concentrating, remembering, or making decisions?: No Patient able to express need for assistance with ADLs?: Yes Does the patient have difficulty dressing or bathing?: No Independently performs ADLs?: Yes (appropriate for developmental age) Does the patient have difficulty walking or climbing stairs?: Yes Weakness of Legs: Both Weakness of Arms/Hands: None  Permission Sought/Granted                  Emotional Assessment              Admission diagnosis:  Numbness [R20.0] Weakness [R53.1] Polyneuropathy [G62.9] Patient Active Problem List   Diagnosis Date  Noted  . Weakness 02/03/2020  . Essential hypertension 02/03/2020  . Polyneuropathy 02/03/2020  . COVID-19 virus infection 02/03/2020  . Diabetes mellitus type 2 in obese ([place])   . BPH (benign prostatic hyperplasia)    PCP:  [name], DO Pharmacy:   [place], [place] - [place] [address] [place] [address] Phone: [phone] Fax: [phone]     Social Determinants of Health (SDOH) Interventions    Readmission Risk Interventions No  flowsheet data found.

## 2020-02-08 DIAGNOSIS — U071 COVID-19: Secondary | ICD-10-CM | POA: Diagnosis not present

## 2020-02-08 DIAGNOSIS — E669 Obesity, unspecified: Secondary | ICD-10-CM | POA: Diagnosis not present

## 2020-02-08 DIAGNOSIS — I10 Essential (primary) hypertension: Secondary | ICD-10-CM | POA: Diagnosis not present

## 2020-02-08 DIAGNOSIS — E1169 Type 2 diabetes mellitus with other specified complication: Secondary | ICD-10-CM | POA: Diagnosis not present

## 2020-02-08 LAB — CBC WITH DIFFERENTIAL/PLATELET
Abs Immature Granulocytes: 0.03 10*3/uL (ref 0.00–0.07)
Basophils Absolute: 0 10*3/uL (ref 0.0–0.1)
Basophils Relative: 0 %
Eosinophils Absolute: 0.1 10*3/uL (ref 0.0–0.5)
Eosinophils Relative: 1 %
HCT: 46.3 % (ref 39.0–52.0)
Hemoglobin: 15.8 g/dL (ref 13.0–17.0)
Immature Granulocytes: 0 %
Lymphocytes Relative: 13 %
Lymphs Abs: 1.1 10*3/uL (ref 0.7–4.0)
MCH: 30.6 pg (ref 26.0–34.0)
MCHC: 34.1 g/dL (ref 30.0–36.0)
MCV: 89.7 fL (ref 80.0–100.0)
Monocytes Absolute: 1 10*3/uL (ref 0.1–1.0)
Monocytes Relative: 12 %
Neutro Abs: 6.3 10*3/uL (ref 1.7–7.7)
Neutrophils Relative %: 74 %
Platelets: 211 10*3/uL (ref 150–400)
RBC: 5.16 MIL/uL (ref 4.22–5.81)
RDW: 12.1 % (ref 11.5–15.5)
WBC: 8.5 10*3/uL (ref 4.0–10.5)
nRBC: 0 % (ref 0.0–0.2)

## 2020-02-08 LAB — COMPREHENSIVE METABOLIC PANEL
ALT: 23 U/L (ref 0–44)
AST: 25 U/L (ref 15–41)
Albumin: 2.8 g/dL — ABNORMAL LOW (ref 3.5–5.0)
Alkaline Phosphatase: 61 U/L (ref 38–126)
Anion gap: 9 (ref 5–15)
BUN: 19 mg/dL (ref 8–23)
CO2: 25 mmol/L (ref 22–32)
Calcium: 8.6 mg/dL — ABNORMAL LOW (ref 8.9–10.3)
Chloride: 93 mmol/L — ABNORMAL LOW (ref 98–111)
Creatinine, Ser: 0.75 mg/dL (ref 0.61–1.24)
GFR, Estimated: >60 mL/min (ref >60)
Glucose, Bld: 184 mg/dL — ABNORMAL HIGH (ref 70–99)
Potassium: 4 mmol/L (ref 3.5–5.1)
Sodium: 127 mmol/L — ABNORMAL LOW (ref 135–145)
Total Bilirubin: 1.2 mg/dL (ref 0.3–1.2)
Total Protein: 8.7 g/dL — ABNORMAL HIGH (ref 6.5–8.1)

## 2020-02-08 LAB — GLUCOSE, CAPILLARY
Glucose-Capillary: 194 mg/dL — ABNORMAL HIGH (ref 70–99)
Glucose-Capillary: 206 mg/dL — ABNORMAL HIGH (ref 70–99)
Glucose-Capillary: 241 mg/dL — ABNORMAL HIGH (ref 70–99)
Glucose-Capillary: 226 mg/dL — ABNORMAL HIGH (ref 70–99)

## 2020-02-08 LAB — D-DIMER, QUANTITATIVE: D-Dimer, Quant: 1.6 ug/mL-FEU — ABNORMAL HIGH (ref 0.00–0.50)

## 2020-02-08 LAB — C-REACTIVE PROTEIN: CRP: 0.7 mg/dL (ref <1.0)

## 2020-02-08 MED ORDER — DOCUSATE SODIUM 100 MG PO CAPS
100.0000 mg | ORAL_CAPSULE | Freq: Two times a day (BID) | ORAL | Status: DC
  Administered 2020-02-08 – 2020-02-15 (×13): 100 mg via ORAL
  Filled 2020-02-08 (×19): qty 1

## 2020-02-08 MED ORDER — POLYETHYLENE GLYCOL 3350 17 G PO PACK
17.0000 g | PACK | Freq: Every day | ORAL | Status: DC
  Administered 2020-02-08 – 2020-02-12 (×5): 17 g via ORAL
  Filled 2020-02-08 (×5): qty 1

## 2020-02-08 NOTE — Progress Notes (Signed)
Inpatient Rehab Admissions Coordinator:   I spoke with Pt. And wife over the phone regarding potential CIR admission. They stated that they would like to pursue CIR admission. Pt. Will need to be off of COVID isolation before coming to CIR and will need to have completed IVIG treatments. Pt.'s wife states that pt. Was supposed to transition to a medicare advantage plan on 2/1 but she is planning to cancel this tomorrow. I will hold on admitting this patient until payor source is clarified.   Clemens Catholic, [place], [place] Admissions Coordinator  [phone] ([place]) [phone] (office)

## 2020-02-08 NOTE — Progress Notes (Signed)
NIF: >-45 VC: unavailable.

## 2020-02-08 NOTE — Progress Notes (Addendum)
PROGRESS NOTE    Jon Townsend  [id number] DOB: 20-Nov-1955 DOA: 02/03/2020 PCP: [name], DO    Brief Narrative:  65 y.o. male with history of hypertension, hyperlipidemia, diabetes mellitus type 2 who has had Covid infection in March 2021 was given antibodies following which symptoms resolved has been having some upper respiratory tract symptoms last month for which patient received prednisone antibiotics and also last week for which again patient received prednisone.  Patient started experiencing tingling and numbness of the upper extremities on Sunday February 01, 2020 which slowly progressed proximally and also started involving the lower extremities.  At that point patient came to the ER was evaluated by neurologist and outpatient further work-up was recommended.  But since patient's symptoms got more worse with weakness of the lower extremities patient decided to come to the ER.  Patient also has been having low back pain denies any fever chills or any incontinence of urine or bowel.  Assessment & Plan:   Principal Problem:   Polyneuropathy Active Problems:   Diabetes mellitus type 2 in obese (HCC)   Weakness   Essential hypertension   COVID-19 virus infection  1. Guillain-Barr syndrome/polyneuropathy 1. Nephrology had been following, now signed off 2. Pt is continued on IVIG per Neurology recs, plan to complete total 5 days 2. Low back pain  1. MRI L-spine reviewed. Findings consistent with prior LP attempts noted 2. Little to no benefit with fentanyl IV 3. Will give trial of heat pack and toradol 4. PT/OT consulted thus far with recs for CIR. CIR following 5. Discussed with Neurology. Recommendation to avoid muscle relaxants. Instead, continue on PRN dilaudid with NSAIDs as needed and lidocaine patch  6. On dilaudid q4hrs PRN 7. Given trial of low dose neurontin 8. Recommend repositioning and trial of sleeping in reclining chair instead of bed 3. Covid  infection positive but patient is asymptomatic.   1. Will follow inflammatory markers 2. CRP down to 0.7, pt remains asymptomatic 3. Now off isolation 4. Diabetes mellitus type 2 1.  Will continue SSI coverage as needed. 5. Hypertension 1. continue on amlodipine and beta-blockers as tolerated 6. Hyperlipidemia on statins. 7. BPH, will continue on Flomax as tolerated 8. Hyponatremia 1. Suspect secondary to IVIG recently given, now stopped 2. Will check repeat BMET in AM  DVT prophylaxis: SCD's Code Status: Full Family Communication: Pt in room, family not at bedside  Status is: Inpatient  Remains inpatient appropriate because:IV treatments appropriate due to intensity of illness or inability to take PO and Inpatient level of care appropriate due to severity of illness  Dispo: The patient is from: Home              Anticipated d/c is to: Home              Anticipated d/c date is: 3 days              Patient currently is not medically stable to d/c.   Difficult to place patient No   Consultants:   Neurology  Procedures:     Antimicrobials: Anti-infectives (From admission, onward)   None      Subjective: Reports continued back pains, worse when laying in bed at night  Objective: Vitals:   02/08/20 0322 02/08/20 0748 02/08/20 1220 02/08/20 1516  BP: (!) 178/99 (!) 141/91 (!) 154/89 (!) 150/94  Pulse: 74 75 74 85  Resp: 18 20 18 18   Temp: 98 F (36.7 C) 98.1 F (36.7 C) 98.1  F (36.7 C) 98.5 F (36.9 C)  TempSrc: Oral Oral Oral Oral  SpO2: 99% 98% 99% 99%  Weight:      Height:        Intake/Output Summary (Last 24 hours) at 02/08/2020 1705 Last data filed at 02/08/2020 1600 Gross per 24 hour  Intake 120 ml  Output 1100 ml  Net -980 ml   Filed Weights   02/03/20 1228  Weight: 113.4 kg    Examination: General exam: Conversant, in no acute distress Respiratory system: normal chest rise, clear, no audible wheezing Cardiovascular system: regular  rhythm, s1-s2 Gastrointestinal system: Nondistended, nontender, pos BS Central nervous system: No seizures, no tremors Extremities: No cyanosis, no joint deformities Skin: No rashes, no pallor Psychiatry: Affect normal // no auditory hallucinations   Data Reviewed: I have personally reviewed following labs and imaging studies  CBC: Recent Labs  Lab 02/04/20 0140 02/05/20 0442 02/06/20 0539 02/07/20 0214 02/08/20 0038  WBC 10.8* 7.2 7.2 7.3 8.5  NEUTROABS 8.9* 5.5 5.6 5.4 6.3  HGB 16.2 15.9 14.8 15.2 15.8  HCT 46.4 47.5 44.2 43.1 46.3  MCV 89.9 91.2 90.4 88.9 89.7  PLT 208 186 181 200 109   Basic Metabolic Panel: Recent Labs  Lab 02/04/20 0140 02/05/20 0442 02/06/20 0539 02/07/20 0214 02/08/20 0038  NA 133* 133* 131* 126* 127*  K 4.0 4.2 4.0 3.9 4.0  CL 97* 98 98 96* 93*  CO2 26 28 25 24 25   GLUCOSE 162* 194* 200* 206* 184*  BUN 24* 24* 22 20 19   CREATININE 0.85 0.83 0.71 0.67 0.75  CALCIUM 8.3* 8.4* 8.3* 8.3* 8.6*   GFR: Estimated Creatinine Clearance: 119.9 mL/min (by C-G formula based on SCr of 0.75 mg/dL). Liver Function Tests: Recent Labs  Lab 02/04/20 0140 02/05/20 0442 02/06/20 0539 02/07/20 0214 02/08/20 0038  AST 21 23 23 23 25   ALT 21 22 21 22 23   ALKPHOS 72 64 60 57 61  BILITOT 1.5* 1.6* 1.1 1.3* 1.2  PROT 6.3* 7.8 7.6 8.5* 8.7*  ALBUMIN 3.3* 2.9* 2.7* 2.8* 2.8*   No results for input(s): LIPASE, AMYLASE in the last 168 hours. No results for input(s): AMMONIA in the last 168 hours. Coagulation Profile: No results for input(s): INR, PROTIME in the last 168 hours. Cardiac Enzymes: Recent Labs  Lab 02/02/20 1556 02/03/20 1738  CKTOTAL 96 216   BNP (last 3 results) No results for input(s): PROBNP in the last 8760 hours. HbA1C: No results for input(s): HGBA1C in the last 72 hours. CBG: Recent Labs  Lab 02/07/20 1629 02/07/20 2127 02/08/20 0552 02/08/20 1218 02/08/20 1516  GLUCAP 188* 176* 194* 206* 241*   Lipid Profile: No  results for input(s): CHOL, HDL, LDLCALC, TRIG, CHOLHDL, LDLDIRECT in the last 72 hours. Thyroid Function Tests: No results for input(s): TSH, T4TOTAL, FREET4, T3FREE, THYROIDAB in the last 72 hours. Anemia Panel: No results for input(s): VITAMINB12, FOLATE, FERRITIN, TIBC, IRON, RETICCTPCT in the last 72 hours. Sepsis Labs: No results for input(s): PROCALCITON, LATICACIDVEN in the last 168 hours.  No results found for this or any previous visit (from the past 240 hour(s)).   Radiology Studies: No results found.  Scheduled Meds: . alum & mag hydroxide-simeth  30 mL Oral Once  . amLODipine  5 mg Oral Daily  . atorvastatin  40 mg Oral QHS  . docusate sodium  100 mg Oral BID  . gabapentin  100 mg Oral BID  . insulin aspart  0-9 Units Subcutaneous TID WC  .  lidocaine  1 patch Transdermal Q24H  . metoprolol tartrate  50 mg Oral BID  . polyethylene glycol  17 g Oral Daily  . tamsulosin  0.4 mg Oral Daily   Continuous Infusions:    LOS: 5 days   Marylu Lund, MD [facility] Pager On Amion  If 7PM-7AM, please contact night-coverage 02/08/2020, 5:05 PM

## 2020-02-08 NOTE — Progress Notes (Signed)
Physical Therapy Treatment Patient Details Name: Jon Townsend MRN: [id number] DOB: 08/08/1955 Today's Date: 02/08/2020    History of Present Illness 65 y.o. male with history of hypertension, hyperlipidemia, diabetes mellitus type 2 who has had Covid infection in March 2021 was given antibodies following which symptoms resolved has been having some upper respiratory tract symptoms last month. Patient started experiencing tingling and numbness of the upper extremities on Sunday February 01, 2020 which slowly progressed proximally and also started involving the lower extremities. Admitted for polyneuropathy workup.    PT Comments    Pt tolerates treatment well but remains very weak. Pt continues to require physical assistance for all functional mobility. PT provides significant education on the need for aggressive mobilization to improve LE strength and to reduce low back pain. PT also provides education on possible rehab timelines for the pt's current conditions. Pt will benefit from continued acute PT at this time to improve LE strength and reduce falls risk. PT continues to recommend CIR placement.   Follow Up Recommendations  CIR     Equipment Recommendations  Wheelchair (measurements PT);Wheelchair cushion (measurements PT);Hospital bed (mechanical lift)    Recommendations for Other Services       Precautions / Restrictions Precautions Precautions: Fall Precaution Comments: contact and airborne precautions Restrictions Weight Bearing Restrictions: No    Mobility  Bed Mobility Overal bed mobility: Needs Assistance Bed Mobility: Supine to Sit     Supine to sit: Min assist;HOB elevated        Transfers Overall transfer level: Needs assistance Equipment used: 1 person hand held assist Transfers: Sit to/from [employer] Transfers Sit to Stand: Max assist;From elevated surface Stand pivot transfers: Max assist       General transfer comment: pt requiring maxA to  stand from bed with BUE support of PT and R knee block. Pt knee does buckle consistently without knee block provided. With 4 standing attempts from recliner pt is able to push from armrests with improved success, still requiring maxA but nearly a modA transfer into standing. PT provides cues for trunk/hip/knee extension in standing  Ambulation/Gait                 Stairs             Wheelchair Mobility    Modified Rankin (Stroke Patients Only)       Balance Overall balance assessment: Needs assistance Sitting-balance support: Single extremity supported;Feet supported;No upper extremity supported Sitting balance-Leahy Scale: Fair Sitting balance - Comments: Static sitting EOB, no LOB with supervision for safety.   Standing balance support: Bilateral upper extremity supported Standing balance-Leahy Scale: Poor Standing balance comment: pt requires modA fro brief periods in standing with BUE support and R knee block, otherwise maxA                            Cognition Arousal/Alertness: Awake/alert Behavior During Therapy: WFL for tasks assessed/performed Overall Cognitive Status: Within Functional Limits for tasks assessed                                        Exercises General Exercises - Lower Extremity Ankle Circles/Pumps: AROM;Both;10 reps Quad Sets: AROM;Both;10 reps Gluteal Sets: AROM;Both;10 reps Long Arc Quad: AROM;Both;5 reps Heel Slides: AROM;Both;5 reps    General Comments General comments (skin integrity, edema, etc.): pt provided education on therapy timeline.  PT provides education on the need for mobility to improve strength and also to reduce low back pain      Pertinent Vitals/Pain Pain Assessment: 0-10 Pain Score: 2  Pain Location: low back Pain Descriptors / Indicators: Aching Pain Intervention(s): Monitored during session    Home Living                      Prior Function            PT Goals  (current goals can now be found in the care plan section) Acute Rehab PT Goals Patient Stated Goal: to return to independent mobility Progress towards PT goals: Progressing toward goals (slowly)    Frequency    Min 4X/week      PT Plan Current plan remains appropriate    Co-evaluation              AM-PAC PT "6 Clicks" Mobility   Outcome Measure  Help needed turning from your back to your side while in a flat bed without using bedrails?: A Lot Help needed moving from lying on your back to sitting on the side of a flat bed without using bedrails?: A Lot Help needed moving to and from a bed to a chair (including a wheelchair)?: A Lot Help needed standing up from a chair using your arms (e.g., wheelchair or bedside chair)?: A Lot Help needed to walk in hospital room?: Total Help needed climbing 3-5 steps with a railing? : Total 6 Click Score: 10    End of Session   Activity Tolerance: Patient tolerated treatment well Patient left: in chair;with call bell/phone within reach;with chair alarm set Nurse Communication: Mobility status;Need for lift equipment (STEDY) PT Visit Diagnosis: Other abnormalities of gait and mobility (R26.89);Muscle weakness (generalized) (M62.81);Other symptoms and signs involving the nervous system (R29.898);Unsteadiness on feet (R26.81);Difficulty in walking, not elsewhere classified (R26.2);Pain Pain - Right/Left:  (back)     Time: 9604-5409 PT Time Calculation (min) (ACUTE ONLY): 31 min  Charges:  $Therapeutic Activity: 23-37 mins                     Zenaida Niece, PT, DPT Acute Rehabilitation Pager: [phone]    Zenaida Niece 02/08/2020, 11:02 AM

## 2020-02-08 NOTE — PMR Pre-admission (Signed)
PMR Admission Coordinator Pre-Admission Assessment  Patient: Jon Townsend is an 65 y.o., male MRN: [id number] DOB: March 05, 1955 Height: $RemoveBefo'6\' 1"'ETYiOlTZEhS$  (185.4 cm) Weight: 113.4 kg  Insurance Information HMO:     PPO:      PCP:      IPA:      80/20:      OTHER:  PRIMARY: [employer] Medicare      Policy#:[id number]      Subscriber: Pt CM Name: Beckie Busing       Phone#:[phone]     Fax#: [phone] Pre-Cert#: [id number]     Approved for 7 days beginning 02/13/20-02/19/20. Updates due 02/19/20.  Employer:  Benefits:  Phone #:  [phone]   Name:  Irene Shipper Date: 02/10/2020 - still active Deductible: does not have OOP Max: $4,500 ($0 met) CIR: $295/day co-pay with a max co-pay of $1,770/admission (6 days) SNF: 100% coverage Outpatient:  $35/visit co-pay; Home Health:  100% coverage,  0% co-insurance DME: 80% coverage; 20% co-insurance  SECONDARY: none       Policy#:      Phone#:   The "Data Collection Information Summary" for patients in Inpatient Rehabilitation Facilities with attached "Privacy Act [place] Records" was provided and verbally reviewed with: Patient  Emergency Contact Information Contact Information    Name Relation Home Work [place] Daughter   [phone]   Shanta, Dorvil [phone]  [phone]      Current Medical History  Patient Admitting Diagnosis: GBS, Polyneuropathy History of Present Illness:Jon Townsend is a 65 y.o. male with history of T2DM, HTN, BPH who presented to ED on 02/02/29 with 48 hours history of progressive weakness --BLE>BUE and paresthesias, numbness bilateral hands/feet and inability to walk. He was found to be Covid + and reported having HA, URI symptoms and anorexia. Work up revealed areflexia with tetraplegia --attempts at LP unsuccessful and patient treated with IVIG X 5 doses for symptoms consistent with GBS in setting of Covid infection.   MRI spine showed degenerative spondylosis with foraminal narrowing C5/C6,   Multilevel degenerative spondylosis lumbar spine with moderate spinal stenosis L2/3- L3/4. LBP treated with local measures and recommendations to avoid muscle relaxers per neurology. Physical Medicine & Rehabilitation was consulted to assess candidacy for CIR given diffuse weakness and numbness.    Complete NIHSS TOTAL: 2  Patient's medical record from [facility]  has been reviewed by the rehabilitation admission coordinator and physician.  Past Medical History  Past Medical History:  Diagnosis Date  . Allergy   . BPH (benign prostatic hyperplasia)   . Diabetes mellitus type 2 in obese ([place])   . Glaucoma   . Hypertension     Family History   family history includes COPD in his sister; Cancer in his father; Diabetes in his sister and sister; Early death in his father; Heart disease in his sister.  Prior Rehab/Hospitalizations Has the patient had prior rehab or hospitalizations prior to admission? No  Has the patient had major surgery during 100 days prior to admission? Yes   Current Medications  Current Facility-Administered Medications:  .  acetaminophen (TYLENOL) tablet 650 mg, 650 mg, Oral, Q6H PRN, 650 mg at 02/11/20 0330 **OR** acetaminophen (TYLENOL) suppository 650 mg, 650 mg, Rectal, Q6H PRN, Rise Patience, MD .  amLODipine (NORVASC) tablet 5 mg, 5 mg, Oral, Daily, Rise Patience, MD, 5 mg at 02/19/20 0854 .  atorvastatin (LIPITOR) tablet 40 mg, 40 mg, Oral, QHS, Rise Patience, MD, 40 mg at 02/18/20 2141 .  Chlorhexidine Gluconate  Cloth 2 % PADS 6 each, 6 each, Topical, Daily, Caren Griffins, MD, 6 each at 02/19/20 [phone] .  docusate sodium (COLACE) capsule 100 mg, 100 mg, Oral, BID, Donne Hazel, MD, 100 mg at 02/15/20 2204 .  fexofenadine (ALLEGRA) tablet 60 mg, 60 mg, Oral, BID, Caren Griffins, MD, 60 mg at 02/19/20 0859 .  gabapentin (NEURONTIN) capsule 100 mg, 100 mg, Oral, TID, Raiford Noble [place], DO, 100 mg at 02/19/20 5102 .   insulin aspart (novoLOG) injection 0-9 Units, 0-9 Units, Subcutaneous, TID WC, Rise Patience, MD, 2 Units at 02/19/20 [phone] .  latanoprost (XALATAN) 0.005 % ophthalmic solution 1 drop, 1 drop, Both Eyes, QHS, Donne Hazel, MD, 1 drop at 02/18/20 2144 .  lidocaine (LIDODERM) 5 % 1 patch, 1 patch, Transdermal, Q24H, Donne Hazel, MD, 1 patch at 02/06/20 1030 .  melatonin tablet 10 mg, 10 mg, Oral, QHS PRN, Shalhoub, Sherryll Burger, MD, 10 mg at 02/18/20 2142 .  metoprolol tartrate (LOPRESSOR) tablet 50 mg, 50 mg, Oral, BID, Rise Patience, MD, 50 mg at 02/19/20 0853 .  ondansetron (ZOFRAN-ODT) disintegrating tablet 4 mg, 4 mg, Oral, Q8H PRN, Caren Griffins, MD, 4 mg at 02/18/20 1151 .  oxyCODONE (Oxy IR/ROXICODONE) immediate release tablet 10 mg, 10 mg, Oral, Q4H PRN, Caren Griffins, MD, 10 mg at 02/19/20 0250 .  simethicone (MYLICON) chewable tablet 40 mg, 40 mg, Oral, Q6H PRN, Shela Leff, MD, 40 mg at 02/18/20 2142 .  sodium chloride (OCEAN) 0.65 % nasal spray 1 spray, 1 spray, Each Nare, PRN, Caren Griffins, MD, 1 spray at 02/14/20 1410 .  tamsulosin (FLOMAX) capsule 0.4 mg, 0.4 mg, Oral, Daily, Rise Patience, MD, 0.4 mg at 02/19/20 0853 .  trimethoprim-polymyxin b (POLYTRIM) ophthalmic solution 1 drop, 1 drop, Left Eye, Q4H, Caren Griffins, MD, 1 drop at 02/19/20 0856  Patients Current Diet:  Diet Order            Diet heart healthy/carb modified Room service appropriate? Yes; Fluid consistency: Thin; Fluid restriction: 1500 mL Fluid  Diet effective now                 Precautions / Restrictions Precautions Precautions: Fall Precaution Comments: off COVID precautions as of 02/13/20 Restrictions Weight Bearing Restrictions: No   Has the patient had 2 or more falls or a fall with injury in the past year? Yes  Prior Activity Level Community (5-7x/wk): Pt. acitve and working PTA  Prior Functional Level Self Care: Did the patient need help bathing,  dressing, using the toilet or eating? Independent  Indoor Mobility: Did the patient need assistance with walking from room to room (with or without device)? Independent  Stairs: Did the patient need assistance with internal or external stairs (with or without device)? Independent  Functional Cognition: Did the patient need help planning regular tasks such as shopping or remembering to take medications? Independent  Home Assistive Devices / Equipment Home Assistive Devices/Equipment: None Home Equipment: Walker - 2 wheels,Shower seat,Bedside commode  Prior Device Use: Indicate devices/aids used by the patient prior to current illness, exacerbation or injury? None of the above  Current Functional Level Cognition  Overall Cognitive Status: Within Functional Limits for tasks assessed Orientation Level: Oriented X4 Following Commands: Follows multi-step commands inconsistently,Follows multi-step commands with increased time General Comments: Pt pleasant and motivated to participate and improve.    Extremity Assessment (includes Sensation/Coordination)  Upper Extremity Assessment: RUE deficits/detail,LUE deficits/detail RUE Deficits /  Details: Increased bimanual manipulation during grooming tasks. RUE Sensation: decreased light touch LUE Deficits / Details: similar to RUE however R shoulder strength weaker LUE Sensation: decreased light touch  Lower Extremity Assessment: Defer to PT evaluation RLE Deficits / Details: grossly 4-/5 RLE Sensation: decreased light touch LLE Deficits / Details: grossly 4-/5 LLE Sensation: decreased light touch    ADLs  Overall ADL's : Needs assistance/impaired Eating/Feeding: Set up,Supervision/ safety Eating/Feeding Details (indicate cue type and reason): may benfit from built up tubing Grooming: Set up Grooming Details (indicate cue type and reason): Patient tolerated 3/3 grooming tasks in perched position in [place] at sink. Will attempt standing at sink  with chair directly behind patient at time of next session. Upper Body Bathing: Set up,Sitting Lower Body Bathing: Moderate assistance,Sitting/lateral leans Lower Body Bathing Details (indicate cue type and reason): pt able to reach down to LB from sitting EOB but requries MOD A for cleanliness Upper Body Dressing : Set up,Sitting Lower Body Dressing: Moderate assistance,Sitting/lateral leans Lower Body Dressing Details (indicate cue type and reason): pt able to reach to BLEs from EOB but requires MOD A d/t fatigur from reaching out of BOS Toilet Transfer: Total assistance,Minimal assistance Toilet Transfer Details (indicate cue type and reason): simualated via pivot transfer to recliner with stedy, MIN A to stand to stedy with use of momentum and total A for the pivot with use of stedy Functional mobility during ADLs: Minimal assistance (sit<>stand only from EOB with stedy) General ADL Comments: pt continues to present with generalized deconditioning, decreased activity tolerance and generalized weakness ( RUE>LUE) impacting pts ability to complete BADLs independently    Mobility  Overal bed mobility: Needs Assistance Bed Mobility: Supine to Sit Rolling: Supervision Supine to sit: Min guard,HOB elevated Sit to supine: Min guard,HOB elevated General bed mobility comments: Heavy use of rail to get to EOB with increased time.    Transfers  Overall transfer level: Needs assistance Equipment used: Rolling walker (2 wheeled) Transfer via Lift Equipment: Stedy Transfers: Sit to/from Stand Sit to Stand: [employer] physical assistance,+2 safety/equipment Stand pivot transfers: Mod assist Squat pivot transfers: Max assist General transfer comment: Min A of 2 to stand from EOB 1x and chair 1x with cues for hand placement as pt pulling up on RW. Pt would follow direction for one hand on current sitting surface to push up from and other on RW as he had difficulty transferring his hands to the RW  on one failed attempt to come to stand.    Ambulation / Gait / Stairs / Wheelchair Mobility  Ambulation/Gait Ambulation/Gait assistance: Min assist,+2 safety/equipment Gait Distance (Feet): 50 Feet (x2 bouts of ~50 ft > ~40 ft) Assistive device: Rolling walker (2 wheeled) Gait Pattern/deviations: Step-through pattern,Trunk flexed,Decreased step length - right,Decreased step length - left,Decreased stride length General Gait Details: Slow, unsteady gait with bil knee and ankle instability laterally; heavy WB through BUEs. Cued pt to perform heel-to-toe sequence with steps, with mod success. Pt remains within RW throughout. No overt LOB, minA for steadying and chair follow. Gait velocity: reduced Gait velocity interpretation: <1.31 ft/sec, indicative of household ambulator    Posture / Balance Dynamic Sitting Balance Sitting balance - Comments: supervision for safety. Balance Overall balance assessment: Needs assistance Sitting-balance support: Feet supported,No upper extremity supported Sitting balance-Leahy Scale: Fair Sitting balance - Comments: supervision for safety. Postural control: Posterior lean Standing balance support: Bilateral upper extremity supported Standing balance-Leahy Scale: Poor Standing balance comment: Bil UE support on RW for stability.  Special needs/care consideration Post-covid, will need private room   Previous Home Environment (from acute therapy documentation) Living Arrangements: Spouse/significant other Available Help at Discharge: Family,Available 24 hours/day Type of Home: [place]: Two level,Able to live on main level with bedroom/bathroom Home Access: Stairs to enter Entrance Stairs-Rails: None Entrance Stairs-Number of Steps: 2 Bathroom Shower/Tub: Multimedia programmer: Standard Bathroom Accessibility: Yes How Accessible: Accessible via walker,Accessible via wheelchair [place]: No Additional Comments: spouse  with recent hip fx, still utilizing cane to ambulate, cannot provide physical assistance  Discharge Living Setting Plans for Discharge Living Setting: Patient's home Type of Home at Discharge: House Discharge Home Layout: Two level,Able to live on main level with bedroom/bathroom Discharge Home Access: Stairs to enter Entrance Stairs-Rails: None Entrance Stairs-Number of Steps: 2 Discharge Bathroom Shower/Tub: Walk-in shower Discharge Bathroom Toilet: Standard Discharge Bathroom Accessibility: Yes How Accessible: Accessible via walker,Accessible via wheelchair Does the patient have any problems obtaining your medications?: No  Social/Family/Support Systems Patient Roles: Spouse Contact Information: [phone] Anticipated Caregiver: Ryler Laskowski (Pt.'s wife was admitted to CIR 3 months ago following hip fracture and ambulates with a cane. She can provide mostly supervision but can provide min A with some ADLs.  Anticipated Caregiver's Contact Information: [phone] Ability/Limitations of Caregiver: Can provide supervision to Min A Caregiver Availability: 24/7 Discharge Plan Discussed with Primary Caregiver: Yes Is Caregiver In Agreement with Plan?: Yes Does Caregiver/Family have Issues with Lodging/Transportation while Pt is in Rehab?: Yes  Goals Patient/Family Goal for Rehab: PT/OT Supervision to Min A Expected length of stay: 14-21 days Pt/Family Agrees to Admission and willing to participate: Yes Program Orientation Provided & Reviewed with Pt/Caregiver Including Roles  & Responsibilities: Yes  Barriers to Discharge: Decreased caregiver support  Decrease burden of Care through IP rehab admission: Specialzed equipment needs, Decrease number of caregivers, Bowel and bladder program and Patient/family education  Possible need for SNF placement upon discharge: not anticipated  Patient Condition: This patient's medical and functional status has changed since the consult dated:  02/09/2020 in which the Rehabilitation Physician determined and documented that the patient's condition is appropriate for intensive rehabilitative care in an inpatient rehabilitation facility. See "History of Present Illness" (above) for medical update. Functional changes are: improvement in trasnfers from Mod A +2 with the stedy and no gait to Min A  + 2 with RW and Min A +2 for gait 50 feet. Patient's medical and functional status update has been discussed with the Rehabilitation physician and patient remains appropriate for inpatient rehabilitation. Will admit to inpatient rehab today, 02/19/20  Preadmission Screen Completed By:  Genella Mech, 02/17/2020 3:47 PM with day of admit updates by: Raechel Ache, OTR/L at 02/19/20 at 10:42AM. ______________________________________________________________________   Discussed status with Dr. Dagoberto Ligas  on 02/19/20 at 10:41AM and received approval for admission today.  Admission Coordinator:  Genella Mech, CCC-SLP, with day of admit updates by Raechel Ache, OTR/L at time 10:41AM/Date 02/19/20   Assessment/Plan: Diagnosis: 1. Does the need for close, 24 hr/day Medical supervision in concert with the patient's rehab needs make it unreasonable for this patient to be served in a less intensive setting? Yes 2. Co-Morbidities requiring supervision/potential complications: HTN, BPH, DM2, GBS with generalized LE>UE weakness 3. Due to bladder management, bowel management, safety, skin/wound care, disease management and medication administration, does the patient require 24 hr/day rehab nursing? Yes 4. Does the patient require coordinated care of a physician, rehab nurse, PT, OT, and SLP to address physical and functional  deficits in the context of the above medical diagnosis(es)? Yes Addressing deficits in the following areas: balance, endurance, locomotion, strength, transferring, bathing, dressing, feeding, grooming and toileting 5. Can the patient actively participate  in an intensive therapy program of at least 3 hrs of therapy 5 days a week? Yes 6. The potential for patient to make measurable gains while on inpatient rehab is good 7. Anticipated functional outcomes upon discharge from inpatient rehab: supervision and min assist PT, supervision and min assist OT, n/a SLP 8. Estimated rehab length of stay to reach the above functional goals is: 14-21 days 9. Anticipated discharge destination: Home 10. Overall Rehab/Functional Prognosis: good   MD Signature:

## 2020-02-08 NOTE — Plan of Care (Signed)

## 2020-02-09 DIAGNOSIS — G629 Polyneuropathy, unspecified: Secondary | ICD-10-CM

## 2020-02-09 DIAGNOSIS — E1169 Type 2 diabetes mellitus with other specified complication: Secondary | ICD-10-CM | POA: Diagnosis not present

## 2020-02-09 DIAGNOSIS — I10 Essential (primary) hypertension: Secondary | ICD-10-CM | POA: Diagnosis not present

## 2020-02-09 DIAGNOSIS — E669 Obesity, unspecified: Secondary | ICD-10-CM | POA: Diagnosis not present

## 2020-02-09 DIAGNOSIS — U071 COVID-19: Secondary | ICD-10-CM | POA: Diagnosis not present

## 2020-02-09 LAB — COMPREHENSIVE METABOLIC PANEL
ALT: 30 U/L (ref 0–44)
AST: 29 U/L (ref 15–41)
Albumin: 2.9 g/dL — ABNORMAL LOW (ref 3.5–5.0)
Alkaline Phosphatase: 63 U/L (ref 38–126)
Anion gap: 8 (ref 5–15)
BUN: 26 mg/dL — ABNORMAL HIGH (ref 8–23)
CO2: 25 mmol/L (ref 22–32)
Calcium: 8.6 mg/dL — ABNORMAL LOW (ref 8.9–10.3)
Chloride: 95 mmol/L — ABNORMAL LOW (ref 98–111)
Creatinine, Ser: 0.76 mg/dL (ref 0.61–1.24)
GFR, Estimated: >60 mL/min (ref >60)
Glucose, Bld: 205 mg/dL — ABNORMAL HIGH (ref 70–99)
Potassium: 3.9 mmol/L (ref 3.5–5.1)
Sodium: 128 mmol/L — ABNORMAL LOW (ref 135–145)
Total Bilirubin: 1.4 mg/dL — ABNORMAL HIGH (ref 0.3–1.2)
Total Protein: 9 g/dL — ABNORMAL HIGH (ref 6.5–8.1)

## 2020-02-09 LAB — GLUCOSE, CAPILLARY
Glucose-Capillary: 197 mg/dL — ABNORMAL HIGH (ref 70–99)
Glucose-Capillary: 238 mg/dL — ABNORMAL HIGH (ref 70–99)
Glucose-Capillary: 208 mg/dL — ABNORMAL HIGH (ref 70–99)
Glucose-Capillary: 209 mg/dL — ABNORMAL HIGH (ref 70–99)

## 2020-02-09 MED ORDER — LATANOPROST 0.005 % OP SOLN
1.0000 [drp] | Freq: Every day | OPHTHALMIC | Status: DC
  Filled 2020-02-09: qty 2.5

## 2020-02-09 MED ORDER — LATANOPROST 0.005 % OP SOLN
1.0000 [drp] | Freq: Every day | OPHTHALMIC | Status: DC
  Administered 2020-02-10 – 2020-02-18 (×9): 1 [drp] via OPHTHALMIC
  Filled 2020-02-09: qty 2.5

## 2020-02-09 MED ORDER — HYDROMORPHONE HCL 1 MG/ML IJ SOLN
0.5000 mg | INTRAMUSCULAR | Status: DC | PRN
  Administered 2020-02-09 – 2020-02-18 (×40): 0.5 mg via INTRAVENOUS
  Filled 2020-02-09 (×42): qty 0.5

## 2020-02-09 MED ORDER — OXYCODONE HCL 5 MG PO TABS
5.0000 mg | ORAL_TABLET | ORAL | Status: DC | PRN
  Administered 2020-02-09 – 2020-02-11 (×9): 5 mg via ORAL
  Filled 2020-02-09 (×9): qty 1

## 2020-02-09 MED ORDER — HYDROMORPHONE HCL 1 MG/ML IJ SOLN
0.5000 mg | INTRAMUSCULAR | Status: DC | PRN
  Administered 2020-02-09: 0.5 mg via INTRAVENOUS
  Filled 2020-02-09: qty 0.5

## 2020-02-09 NOTE — Progress Notes (Signed)
PROGRESS NOTE    Jon Townsend  [id number] DOB: 1955/01/19 DOA: 02/03/2020 PCP: [name], DO    Brief Narrative:  65 y.o. male with history of hypertension, hyperlipidemia, diabetes mellitus type 2 who has had Covid infection in March 2021 was given antibodies following which symptoms resolved has been having some upper respiratory tract symptoms last month for which patient received prednisone antibiotics and also last week for which again patient received prednisone.  Patient started experiencing tingling and numbness of the upper extremities on Sunday February 01, 2020 which slowly progressed proximally and also started involving the lower extremities.  At that point patient came to the ER was evaluated by neurologist and outpatient further work-up was recommended.  But since patient's symptoms got more worse with weakness of the lower extremities patient decided to come to the ER.  Patient also has been having low back pain denies any fever chills or any incontinence of urine or bowel.  Assessment & Plan:   Principal Problem:   Polyneuropathy Active Problems:   Diabetes mellitus type 2 in obese ([facility])   Weakness   Essential hypertension   COVID-19 virus infection  1. Guillain-Barr syndrome/polyneuropathy 1. Nephrology had been following, now signed off 2. Pt is continued on IVIG per Neurology recs, plan to complete total 5 days 2. Low back pain  1. MRI L-spine reviewed. Findings consistent with prior LP attempts noted 2. Little to no benefit with fentanyl IV 3. Will give trial of heat pack and toradol 4. PT/OT consulted thus far with recs for CIR. CIR following 5. Discussed with Neurology. Recommendation to avoid muscle relaxants. Instead, continue on PRN dilaudid with NSAIDs as needed and lidocaine patch  6. Transition to po oxycodone 7. On low dose neurontin 3. Covid infection positive but patient is asymptomatic.   1. Will follow inflammatory  markers 2. CRP down to 0.7, pt remains asymptomatic 3. Now off isolation 4. Diabetes mellitus type 2 1.  Will continue SSI coverage as needed. 5. Hypertension 1. continue on amlodipine and beta-blockers as tolerated 6. Hyperlipidemia on statins. 7. BPH, will continue on Flomax as tolerated 8. Hyponatremia 1. Suspect secondary to IVIG recently given, now stopped 2. Trending up 3. Follow bmet  DVT prophylaxis: SCD's Code Status: Full Family Communication: Pt in room, family not at bedside  Status is: Inpatient  Remains inpatient appropriate because:IV treatments appropriate due to intensity of illness or inability to take PO and Inpatient level of care appropriate due to severity of illness  Dispo: The patient is from: Home              Anticipated d/c is to: Home              Anticipated d/c date is: 3 days              Patient currently is not medically stable to d/c.   Difficult to place patient No   Consultants:   Neurology  Procedures:     Antimicrobials: Anti-infectives (From admission, onward)   None      Subjective: Asking to transition to oral analgesia  Objective: Vitals:   02/09/20 0353 02/09/20 0726 02/09/20 1134 02/09/20 1634  BP: (!) 157/89 (!) 158/88 135/78 (!) 155/84  Pulse: 74 78 72 80  Resp: 18 18  18   Temp: 98.2 F (36.8 C) 97.7 F (36.5 C) 97.9 F (36.6 C) 97.8 F (36.6 C)  TempSrc: Oral Oral Oral Oral  SpO2: 96% 97% 98% 99%  Weight:  Height:        Intake/Output Summary (Last 24 hours) at 02/09/2020 1708 Last data filed at 02/08/2020 2045 Gross per 24 hour  Intake 240 ml  Output 250 ml  Net -10 ml   Filed Weights   02/03/20 1228  Weight: 113.4 kg    Examination: General exam: Awake, laying in bed, in nad Respiratory system: Normal respiratory effort, no wheezing Cardiovascular system: regular rate, s1, s2 Gastrointestinal system: Soft, nondistended, positive BS Central nervous system: CN2-12 grossly intact, strength  intact Extremities: Perfused, no clubbing Skin: Normal skin turgor, no notable skin lesions seen Psychiatry: Mood normal // no visual hallucinations    Data Reviewed: I have personally reviewed following labs and imaging studies  CBC: Recent Labs  Lab 02/04/20 0140 02/05/20 0442 02/06/20 0539 02/07/20 0214 02/08/20 0038  WBC 10.8* 7.2 7.2 7.3 8.5  NEUTROABS 8.9* 5.5 5.6 5.4 6.3  HGB 16.2 15.9 14.8 15.2 15.8  HCT 46.4 47.5 44.2 43.1 46.3  MCV 89.9 91.2 90.4 88.9 89.7  PLT 208 186 181 200 831   Basic Metabolic Panel: Recent Labs  Lab 02/05/20 0442 02/06/20 0539 02/07/20 0214 02/08/20 0038 02/09/20 0652  NA 133* 131* 126* 127* 128*  K 4.2 4.0 3.9 4.0 3.9  CL 98 98 96* 93* 95*  CO2 28 25 24 25 25   GLUCOSE 194* 200* 206* 184* 205*  BUN 24* 22 20 19  26*  CREATININE 0.83 0.71 0.67 0.75 0.76  CALCIUM 8.4* 8.3* 8.3* 8.6* 8.6*   GFR: Estimated Creatinine Clearance: 119.9 mL/min (by C-G formula based on SCr of 0.76 mg/dL). Liver Function Tests: Recent Labs  Lab 02/05/20 0442 02/06/20 0539 02/07/20 0214 02/08/20 0038 02/09/20 0652  AST 23 23 23 25 29   ALT 22 21 22 23 30   ALKPHOS 64 60 57 61 63  BILITOT 1.6* 1.1 1.3* 1.2 1.4*  PROT 7.8 7.6 8.5* 8.7* 9.0*  ALBUMIN 2.9* 2.7* 2.8* 2.8* 2.9*   No results for input(s): LIPASE, AMYLASE in the last 168 hours. No results for input(s): AMMONIA in the last 168 hours. Coagulation Profile: No results for input(s): INR, PROTIME in the last 168 hours. Cardiac Enzymes: Recent Labs  Lab 02/03/20 1738  CKTOTAL 216   BNP (last 3 results) No results for input(s): PROBNP in the last 8760 hours. HbA1C: No results for input(s): HGBA1C in the last 72 hours. CBG: Recent Labs  Lab 02/08/20 1516 02/08/20 2158 02/09/20 0616 02/09/20 1130 02/09/20 1633  GLUCAP 241* 226* 197* 238* 208*   Lipid Profile: No results for input(s): CHOL, HDL, LDLCALC, TRIG, CHOLHDL, LDLDIRECT in the last 72 hours. Thyroid Function Tests: No  results for input(s): TSH, T4TOTAL, FREET4, T3FREE, THYROIDAB in the last 72 hours. Anemia Panel: No results for input(s): VITAMINB12, FOLATE, FERRITIN, TIBC, IRON, RETICCTPCT in the last 72 hours. Sepsis Labs: No results for input(s): PROCALCITON, LATICACIDVEN in the last 168 hours.  No results found for this or any previous visit (from the past 240 hour(s)).   Radiology Studies: No results found.  Scheduled Meds: . alum & mag hydroxide-simeth  30 mL Oral Once  . amLODipine  5 mg Oral Daily  . atorvastatin  40 mg Oral QHS  . docusate sodium  100 mg Oral BID  . gabapentin  100 mg Oral BID  . insulin aspart  0-9 Units Subcutaneous TID WC  . latanoprost  1 drop Both Eyes QHS  . lidocaine  1 patch Transdermal Q24H  . metoprolol tartrate  50 mg Oral BID  .  polyethylene glycol  17 g Oral Daily  . tamsulosin  0.4 mg Oral Daily   Continuous Infusions:    LOS: 6 days   Marylu Lund, MD [facility] Pager On Amion  If 7PM-7AM, please contact night-coverage 02/09/2020, 5:08 PM

## 2020-02-09 NOTE — Progress Notes (Signed)
Inpatient Rehab Admissions Coordinator:   I met with Pt. For ongoing discussion regarding inpatient rehab. I explained to pt. That I have to wait for his new policy to go into effect on 2/1 before opening a case with [employer]. Pt. Stated understanding of this. I will continue to follow for potential admit later this week, pending insurance authorization and bed availability.   Laura Staley, MS, CCC-SLP Rehab Admissions Coordinator  [phone] (celll) [phone] (office)  

## 2020-02-09 NOTE — Progress Notes (Signed)
Occupational Therapy Treatment Patient Details Name: Jon Townsend MRN: [id number] DOB: 1956/01/04 Today's Date: 02/09/2020    History of present illness 65 y.o. male with history of hypertension, hyperlipidemia, diabetes mellitus type 2 who has had Covid infection in March 2021 was given antibodies following which symptoms resolved has been having some upper respiratory tract symptoms last month. Patient started experiencing tingling and numbness of the upper extremities on Sunday February 01, 2020 which slowly progressed proximally and also started involving the lower extremities. Admitted for polyneuropathy workup.   OT comments  OT treatment with focus on self-care re-education, ADL transfers, and BUE NMR. Patient seated EOB with PT upon entry requiring Mod A +2 for sit to stand in [place]. Patient clothing soiled with urine requiring +2 assist for hygiene/clothign management in standing with use of Stedy. Patient able to wash front perineal area in sitting with set-up assist. Patient with request for wash cloth to wash face and toothbrush/toothpaste to brush teeth. Noted difficulty with use of dominant RUE. Education provided on self hand over hand assist to maximize utilization of dominant RUE. Patient with good return demonstration. OT provided written HEP with focus on BUE strengthening. Patient able to perform 1-2 sets x5-10 reps each with frequent rest breaks 2/2 fatigue. OT also provided patient with built-up foam to increase independence with self-feeding tasks. Patient seated in recliner with call bell within reach and all needs met at conclusion of session. Patient would benefit from continued acute OT services to maximize safety and independence in prep for d/c to next level of care. Patient continues to be a great candidate for intensive CIR.    Follow Up Recommendations  CIR;Supervision/Assistance - 24 hour    Equipment Recommendations  None recommended by OT    Recommendations for  Other Services Rehab consult    Precautions / Restrictions Precautions Precautions: Fall Restrictions Weight Bearing Restrictions: No       Mobility Bed Mobility Overal bed mobility: Needs Assistance             General bed mobility comments: Seated EOB with PT upon entry.  Transfers Overall transfer level: Needs assistance   Transfers: Sit to/from Stand Sit to Stand: Mod assist;+2 physical assistance         General transfer comment: Mod A +2 for sit to stand x2 from EOB and Min A +2 from perched position in [place]. Cues for upright posture.    Balance Overall balance assessment: Needs assistance Sitting-balance support: Single extremity supported;Feet supported;No upper extremity supported Sitting balance-Leahy Scale: Fair     Standing balance support: Bilateral upper extremity supported Standing balance-Leahy Scale: Poor Standing balance comment: +2 assist in Stedy to maintain standing balance during hygiene/clothing management.                           ADL either performed or assessed with clinical judgement   ADL       Grooming: Set up;Sitting   Upper Body Bathing: Set up;Sitting   Lower Body Bathing: Moderate assistance;Sit to/from stand   Upper Body Dressing : Set up;Sitting                           Vision       Perception     Praxis      Cognition Arousal/Alertness: Awake/alert Behavior During Therapy: WFL for tasks assessed/performed Overall Cognitive Status: Within Functional Limits for tasks assessed  Exercises Exercises: Other exercises Other Exercises Other Exercises: Theraband HEP 1-2 sets x5-10 reps each.   Shoulder Instructions       General Comments      Pertinent Vitals/ Pain       Pain Assessment: Faces Faces Pain Scale: Hurts little more Pain Location: low back at rest and RLE with movement Pain Descriptors / Indicators:  Aching;Tightness Pain Intervention(s): Monitored during session;Repositioned  Home Living                                          Prior Functioning/Environment              Frequency  Min 2X/week        Progress Toward Goals  OT Goals(current goals can now be found in the care plan section)  Progress towards OT goals: Progressing toward goals  Acute Rehab OT Goals Patient Stated Goal: to return to independent mobility OT Goal Formulation: With patient Time For Goal Achievement: 02/19/20 Potential to Achieve Goals: Good ADL Goals Pt Will Perform Lower Body Bathing: with set-up;with supervision;sit to/from stand;sitting/lateral leans Pt Will Perform Lower Body Dressing: with set-up;with supervision;sit to/from stand;sitting/lateral leans Pt Will Transfer to Toilet: with min assist;ambulating Pt Will Perform Toileting - Clothing Manipulation and hygiene: with set-up;with supervision;sitting/lateral leans Pt/caregiver will Perform Home Exercise Program: Both right and left upper extremity;With theraband;With written HEP provided;Independently;Increased strength  Plan Discharge plan remains appropriate;Frequency remains appropriate    Co-evaluation                 AM-PAC OT "6 Clicks" Daily Activity     Outcome Measure   Help from another person eating meals?: A Little Help from another person taking care of personal grooming?: A Little Help from another person toileting, which includes using toliet, bedpan, or urinal?: A Lot Help from another person bathing (including washing, rinsing, drying)?: A Lot Help from another person to put on and taking off regular upper body clothing?: A Little Help from another person to put on and taking off regular lower body clothing?: A Lot 6 Click Score: 15    End of Session Equipment Utilized During Treatment: Gait belt  OT Visit Diagnosis: Unsteadiness on feet (R26.81);Other abnormalities of gait and  mobility (R26.89);Muscle weakness (generalized) (M62.81)   Activity Tolerance Patient tolerated treatment well   Patient Left in chair;with call bell/phone within reach;with nursing/sitter in room   Nurse Communication Mobility status        Time: 2336-1224 OT Time Calculation (min): 30 min  Charges: OT General Charges $OT Visit: 1 Visit OT Treatments $Self Care/Home Management : 23-37 mins  Jon Townsend H. OTR/L Supplemental OT, Department of rehab services [phone]   Jon Townsend R H. 02/09/2020, 8:48 AM

## 2020-02-09 NOTE — Progress Notes (Signed)
Physical Therapy Treatment Patient Details Name: Jon Townsend MRN: [id number] DOB: 30-May-1955 Today's Date: 02/09/2020    History of Present Illness 65 y.o. male with history of hypertension, hyperlipidemia, diabetes mellitus type 2 who has had Covid infection in March 2021 was given antibodies following which symptoms resolved has been having some upper respiratory tract symptoms last month. Patient started experiencing tingling and numbness of the upper extremities on Sunday February 01, 2020 which slowly progressed proximally and also started involving the lower extremities. Admitted for polyneuropathy workup.    PT Comments    Pt  Anxious today requiring max encouragement to have patient recognize improved strength. Suspect pt with significant altered sensation in bilat LEs which is causing pt's perception to equate to minimal strength in LEs despite being able to complete LE there ex. Pt continues to require modAx2 to stand with elevated surface up into stedy. Pt able to maintain standing for at most 60 seconds with max verbal and tactile cues/encouragment. Pt very fatigued but participatory. Pt with improved ability to transfer to EOB with decreased assistance. Continue to recommend CIR upon d/c for maximal functional recovery. Acute PT to cont to follow.   Follow Up Recommendations  CIR     Equipment Recommendations  Wheelchair (measurements PT);Wheelchair cushion (measurements PT);Hospital bed (mechanical lift)    Recommendations for Other Services Rehab consult     Precautions / Restrictions Precautions Precautions: Fall Restrictions Weight Bearing Restrictions: No    Mobility  Bed Mobility Overal bed mobility: Needs Assistance Bed Mobility: Supine to Sit     Supine to sit: Min assist;HOB elevated     General bed mobility comments: HOB elevated, max directional verbal cues to sequence transfer with max encouragement to increase patient effort  Transfers Overall  transfer level: Needs assistance   Transfers: Sit to/from Stand Sit to Stand: Mod assist;+2 physical assistance Stand pivot transfers: Total assist;+2 safety/equipment (stedy used for stand pvt)       General transfer comment: attempted 2 sit to stand with maxA x1, bed elevated and pt pulling up on stedy however unable to achieve full standing. OT assisted with modAx2 to achieve full standing in stedy, minAx2 from perched sitting in stedy position. Pt with bilat weakness/instability in knee, verbal cues to push up/extend elbows to achieve full upright standing and to squeeze bilat gluts, pt stood x 10 sec first stand, then 1 min second stand for hygiene due to soilded underwear  Ambulation/Gait             General Gait Details: Unable to do so safely this date   Stairs             Wheelchair Mobility    Modified Rankin (Stroke Patients Only)       Balance Overall balance assessment: Needs assistance Sitting-balance support: Single extremity supported;Feet supported;No upper extremity supported Sitting balance-Leahy Scale: Fair Sitting balance - Comments: Static sitting EOB, no LOB with supervision for safety, min guard for balance for LE ther ex   Standing balance support: Bilateral upper extremity supported Standing balance-Leahy Scale: Poor Standing balance comment: dependent on external support                            Cognition Arousal/Alertness: Awake/alert Behavior During Therapy: Anxious Overall Cognitive Status: Impaired/Different from baseline Area of Impairment: Following commands  Following Commands: Follows multi-step commands inconsistently;Follows multi-step commands with increased time       General Comments: pt self limiting suspect due to impaired sensation in LEs. Pt able to complete LE ther ex in sitting however pt states 'i can't do it, it wont do what I want it to do" causing anxiety       Exercises General Exercises - Lower Extremity Ankle Circles/Pumps: AROM;Both;10 reps Quad Sets: AROM;20 reps;Right Gluteal Sets: AROM;Both;10 reps Long Arc Quad: AROM;Both;10 reps;Strengthening Heel Slides: AROM;20 reps;Supine;Right Hip Flexion/Marching: Strengthening;Seated;10 reps;Right Other Exercises Other Exercises: Theraband HEP 1-2 sets x5-10 reps each.    General Comments General comments (skin integrity, edema, etc.): VSS      Pertinent Vitals/Pain Pain Assessment: Faces Faces Pain Scale: Hurts little more Pain Location: low back at rest and RLE with movement Pain Descriptors / Indicators: Aching;Tightness Pain Intervention(s): Monitored during session    Home Living                      Prior Function            PT Goals (current goals can now be found in the care plan section) Acute Rehab PT Goals Patient Stated Goal: to return to independent mobility PT Goal Formulation: With patient Time For Goal Achievement: 02/19/20 Potential to Achieve Goals: Good Progress towards PT goals: Progressing toward goals    Frequency    Min 4X/week      PT Plan Current plan remains appropriate    Co-evaluation              AM-PAC PT "6 Clicks" Mobility   Outcome Measure  Help needed turning from your back to your side while in a flat bed without using bedrails?: A Little Help needed moving from lying on your back to sitting on the side of a flat bed without using bedrails?: A Little Help needed moving to and from a bed to a chair (including a wheelchair)?: A Lot Help needed standing up from a chair using your arms (e.g., wheelchair or bedside chair)?: A Lot Help needed to walk in hospital room?: Total Help needed climbing 3-5 steps with a railing? : Total 6 Click Score: 12    End of Session Equipment Utilized During Treatment: Gait belt Activity Tolerance: Patient tolerated treatment well Patient left: in chair;with call bell/phone within  reach;with chair alarm set Nurse Communication: Mobility status;Need for lift equipment (STEDY) PT Visit Diagnosis: Other abnormalities of gait and mobility (R26.89);Muscle weakness (generalized) (M62.81);Other symptoms and signs involving the nervous system (R29.898);Unsteadiness on feet (R26.81);Difficulty in walking, not elsewhere classified (R26.2);Pain Pain - Right/Left:  (back) Pain - part of body:  (back)     Time: 6301-6010 PT Time Calculation (min) (ACUTE ONLY): 30 min  Charges:  $Therapeutic Exercise: 8-22 mins $Therapeutic Activity: 8-22 mins                     Kittie Plater, PT, DPT Acute Rehabilitation Services Pager #: [phone] Office #: [phone]    Berline Lopes 02/09/2020, 10:51 AM

## 2020-02-09 NOTE — Care Management Important Message (Signed)
Important Message  Patient Details  Name: Jon Townsend MRN: [id number] Date of Birth: [birth year]   Medicare Important Message Given:  Yes     [name] [name] 02/09/2020, 3:49 PM

## 2020-02-09 NOTE — Progress Notes (Signed)
Patient was having trouble using urinal. Pt states urge to urinate but could not void at around 2045. At around 2210, pt voided 243mL. Bladder scanned for residual at 2220 and found 138-169mL. When asked about drinking fluids, patient only states he drinks when he receives his food trays. Encouraged and educated about the importance of fluid intake. MD has been messaged.

## 2020-02-09 NOTE — Consult Note (Signed)
Physical Medicine and Rehabilitation Consult  Reason for Consult: GBS with functional decline.  Referring Physician: Dr.Chiu   HPI: Jon Townsend is a 65 y.o. male with history of T2DM, HTN, BPH who presented to ED on 02/02/29 with 48 hours history of progressive weakness --BLE>BUE and paresthesias, numbness bilateral hands/feet and inability to walk. He was found to be Covid + and reported having HA, URI symptoms and anorexia. Work up revealed areflexia with tetraplegia --attempts at LP unsuccessful and patient treated with IVIG X 5 doses for symptoms consistent with GBS in setting of Covid infection.   MRI spine showed degenerative spondylosis with foraminal narrowing C5/C6,  Multilevel degenerative spondylosis lumbar spine with moderate spinal stenosis L2/3- L3/4. LBP treated with local measures and recommendations to avoid muscle relaxers per neurology. Physical Medicine & Rehabilitation was consulted to assess candidacy for CIR given diffuse weakness and numbness.    Review of Systems  Constitutional: Negative for chills and fever.  HENT: Negative for hearing loss and tinnitus.   Eyes: Positive for double vision (in the right eye today). Negative for pain.  Respiratory: Negative for cough, shortness of breath and wheezing.   Cardiovascular: Negative for chest pain and palpitations.  Gastrointestinal: Positive for constipation (only once since admission). Negative for heartburn and nausea.  Genitourinary: Negative for dysuria and urgency.  Musculoskeletal: Positive for back pain.  Neurological: Positive for sensory change and focal weakness.     Past Medical History:  Diagnosis Date  . Allergy   . BPH (benign prostatic hyperplasia)   . Diabetes mellitus type 2 in obese ([place])   . Glaucoma   . Hypertension     Past Surgical History:  Procedure Laterality Date  . Broken finger    . TONSILLECTOMY      Family History  Problem Relation Age of Onset  . Cancer Father    . Early death Father   . Diabetes Sister   . Heart disease Sister   . COPD Sister   . Diabetes Sister   . Colon cancer Neg Hx   . Esophageal cancer Neg Hx   . Stomach cancer Neg Hx   . Rectal cancer Neg Hx     Social History:  Married. Retired from [employer] a week PTA. He reports that he has quit smoking--40+years ago. Marland Kitchen He has never used smokeless tobacco. He reports that he does not drink alcohol and does not use drugs.   Allergies: No Known Allergies    Medications Prior to Admission  Medication Sig Dispense Refill  . amLODipine (NORVASC) 5 MG tablet Take 1 tablet (5 mg total) by mouth daily. 90 tablet 1  . atorvastatin (LIPITOR) 40 MG tablet Take 1 tablet (40 mg total) by mouth daily. (Patient taking differently: Take 40 mg by mouth at bedtime.) 90 tablet 3  . Coenzyme Q10 (COQ10) 100 MG CAPS Take 100 mg by mouth in the morning.    . Ergocalciferol (VITAMIN D2) 50 MCG (2000 UT) TABS Take 2,000 mg by mouth daily.    . fexofenadine (ALLEGRA) 180 MG tablet Take 180 mg by mouth in the morning.    Marland Kitchen ibuprofen (ADVIL) 200 MG tablet Take 400 mg by mouth every 6 (six) hours as needed for mild pain (or headaches).    Javier Docker Oil 1000 MG CAPS Take 1,000 mg by mouth daily.    . meloxicam (MOBIC) 7.5 MG tablet Take 1 tablet (7.5 mg total) by mouth 2 (two) times daily as needed for  pain. 30 tablet 2  . metFORMIN (GLUCOPHAGE-XR) 500 MG 24 hr tablet Take 2 tablets (1,000 mg total) by mouth daily with breakfast. 60 tablet 5  . metoprolol tartrate (LOPRESSOR) 50 MG tablet Take 1 tablet (50 mg total) by mouth 2 (two) times daily. (Patient taking differently: Take 50 mg by mouth in the morning and at bedtime.) 180 tablet 3  . tamsulosin (FLOMAX) 0.4 MG CAPS capsule Take 1 capsule (0.4 mg total) by mouth daily. 90 capsule 3  . vitamin C (ASCORBIC ACID) 500 MG tablet Take 500-1,000 mg by mouth daily.    . Blood Glucose Monitoring Suppl (ONETOUCH VERIO) w/Device KIT Use once daily to check blood sugar 1  kit 0  . glucose blood (ONETOUCH VERIO) test strip Use as once daily to check blood sugar. 100 each 3  . Lancets (ONETOUCH ULTRASOFT) lancets Use daily to check blood sugar. 100 each 3    Home: Home Living Family/patient expects to be discharged to:: Private residence Living Arrangements: Spouse/significant other Available Help at Discharge: Family,Available 24 hours/day Type of Home: House Home Access: Stairs to enter [employer] of Steps: 2 Entrance Stairs-Rails: None Home Layout: Two level,Able to live on main level with bedroom/bathroom Bathroom Shower/Tub: Health visitor: Standard Bathroom Accessibility: Yes Home Equipment: Environmental consultant - 2 wheels,Shower seat,Bedside commode Additional Comments: spouse with recent hip fx, still utilizing cane to ambulate, cannot provide physical assistance  Functional History: Prior Function Level of Independence: Independent Comments: Just retired 1/14 Functional Status:  Mobility: Bed Mobility Overal bed mobility: Needs Assistance Bed Mobility: Supine to Sit Supine to sit: Min assist,HOB elevated General bed mobility comments: Seated EOB with PT upon entry. Transfers Overall transfer level: Needs assistance Equipment used: 1 person hand held assist Transfer via Lift Equipment: Stedy Transfers: Sit to/from Stand Sit to Stand: Mod assist,+2 physical assistance Stand pivot transfers: Max assist Squat pivot transfers: Mod assist,Min assist,+2 physical assistance,+2 safety/equipment,From elevated surface General transfer comment: Mod A +2 for sit to stand x2 from EOB and Min A +2 from perched position in [place]. Cues for upright posture. Ambulation/Gait General Gait Details: Unable to do so safely this date    ADL: ADL Overall ADL's : Needs assistance/impaired Eating/Feeding: Set up,Supervision/ safety Eating/Feeding Details (indicate cue type and reason): may benfit from built up tubing Grooming: Set  up,Sitting Upper Body Bathing: Set up,Sitting Lower Body Bathing: Moderate assistance,Sit to/from stand Upper Body Dressing : Set up,Sitting Lower Body Dressing: Moderate assistance,Sit to/from stand Functional mobility during ADLs: Maximal assistance  Cognition: Cognition Overall Cognitive Status: Within Functional Limits for tasks assessed Orientation Level: Oriented X4 Cognition Arousal/Alertness: Awake/alert Behavior During Therapy: WFL for tasks assessed/performed Overall Cognitive Status: Within Functional Limits for tasks assessed   Blood pressure (!) 158/88, pulse 78, temperature 97.7 F (36.5 C), temperature source Oral, resp. rate 18, height 6\' 1"  (1.854 m), weight 113.4 kg, SpO2 97 %. Physical Exam  General: Alert and oriented x 3, No apparent distress HEENT: Head is normocephalic, atraumatic, PERRLA, EOMI, sclera anicteric, oral mucosa pink and moist, dentition intact, ext ear canals clear,  Neck: Supple without JVD or lymphadenopathy Heart: Reg rate and rhythm. No murmurs rubs or gallops Chest: CTA bilaterally without wheezes, rales, or rhonchi; no distress Abdomen: Soft, non-tender, There is distension.  Extremities: No clubbing, cyanosis, or edema. Pulses are 2+ Psych: Pt's affect is appropriate. Pt is cooperative Skin: Clean and intact without signs of breakdown Neuro: He is alert and oriented to person, place, and time. Mild  right facial weakness without dysarthria. Reporting right diplopia today. Diffuse weakness--sensation improving bilateral palms. 4/5 BUE, 3/5 BLE.    Results for orders placed or performed during the hospital encounter of 02/03/20 (from the past 24 hour(s))  Glucose, capillary     Status: Abnormal   Collection Time: 02/08/20 12:18 PM  Result Value Ref Range   Glucose-Capillary 206 (H) 70 - 99 mg/dL  Glucose, capillary     Status: Abnormal   Collection Time: 02/08/20  3:16 PM  Result Value Ref Range   Glucose-Capillary 241 (H) 70 - 99  mg/dL  Glucose, capillary     Status: Abnormal   Collection Time: 02/08/20  9:58 PM  Result Value Ref Range   Glucose-Capillary 226 (H) 70 - 99 mg/dL   Comment 1 Notify RN    Comment 2 Document in Chart   Glucose, capillary     Status: Abnormal   Collection Time: 02/09/20  6:16 AM  Result Value Ref Range   Glucose-Capillary 197 (H) 70 - 99 mg/dL   Comment 1 Notify RN    Comment 2 Document in Chart   Comprehensive metabolic panel     Status: Abnormal   Collection Time: 02/09/20  6:52 AM  Result Value Ref Range   Sodium 128 (L) 135 - 145 mmol/L   Potassium 3.9 3.5 - 5.1 mmol/L   Chloride 95 (L) 98 - 111 mmol/L   CO2 25 22 - 32 mmol/L   Glucose, Bld 205 (H) 70 - 99 mg/dL   BUN 26 (H) 8 - 23 mg/dL   Creatinine, Ser 0.76 0.61 - 1.24 mg/dL   Calcium 8.6 (L) 8.9 - 10.3 mg/dL   Total Protein 9.0 (H) 6.5 - 8.1 g/dL   Albumin 2.9 (L) 3.5 - 5.0 g/dL   AST 29 15 - 41 U/L   ALT 30 0 - 44 U/L   Alkaline Phosphatase 63 38 - 126 U/L   Total Bilirubin 1.4 (H) 0.3 - 1.2 mg/dL   GFR, Estimated >60 >60 mL/min   Anion gap 8 5 - 15   No results found.   Assessment/Plan: Diagnosis: Polyneuropathy 1. Does the need for close, 24 hr/day medical supervision in concert with the patient's rehab needs make it unreasonable for this patient to be served in a less intensive setting? Yes 2. Co-Morbidities requiring supervision/potential complications:  1. Severe spinal pain requiring IV dilaudid: discussed switching to Oxycontin, nurse has messaged Dr. Wyline Copas. Patient states that he was no on any opioids prior to admission. 2. Obesity (BMI 32.98) 3. Double vision: ?Miller-Fisher variant 4. Weakness in all 4 extremities: will require intensive PT and OT 5. HTN: continue amlodpine 3. Due to bladder management, bowel management, safety, skin/wound care, disease management, medication administration, pain management and patient education, does the patient require 24 hr/day rehab nursing? Yes 4. Does the  patient require coordinated care of a physician, rehab nurse, therapy disciplines of PT, OT to address physical and functional deficits in the context of the above medical diagnosis(es)? Yes Addressing deficits in the following areas: balance, endurance, locomotion, strength, transferring, bowel/bladder control, bathing, dressing, feeding, grooming, toileting and psychosocial support 5. Can the patient actively participate in an intensive therapy program of at least 3 hrs of therapy per day at least 5 days per week? Yes 6. The potential for patient to make measurable gains while on inpatient rehab is good 7. Anticipated functional outcomes upon discharge from inpatient rehab are min assist  with PT, min assist with OT, independent  with SLP. 8. Estimated rehab length of stay to reach the above functional goals is: 2-3 weeks 9. Anticipated discharge destination: Home 10. Overall Rehab/Functional Prognosis: good  RECOMMENDATIONS: This patient's condition is appropriate for continued rehabilitative care in the following setting: CIR Patient has agreed to participate in recommended program. Yes Note that insurance prior authorization may be required for reimbursement for recommended care.  Comment: Thank you for this consult. Admission coordinator to follow.   I have personally performed a face to face diagnostic evaluation, including, but not limited to relevant history and physical exam findings, of this patient and developed relevant assessment and plan.  Additionally, I have reviewed and concur with the physician assistant's documentation above.  Leeroy Cha, MD  Bary Leriche, PA-C 02/09/2020

## 2020-02-10 DIAGNOSIS — E669 Obesity, unspecified: Secondary | ICD-10-CM | POA: Diagnosis not present

## 2020-02-10 DIAGNOSIS — U071 COVID-19: Secondary | ICD-10-CM | POA: Diagnosis not present

## 2020-02-10 DIAGNOSIS — E1169 Type 2 diabetes mellitus with other specified complication: Secondary | ICD-10-CM | POA: Diagnosis not present

## 2020-02-10 LAB — OSMOLALITY: Osmolality: 288 mOsm/kg (ref 275–295)

## 2020-02-10 LAB — COMPREHENSIVE METABOLIC PANEL
ALT: 34 U/L (ref 0–44)
AST: 32 U/L (ref 15–41)
Albumin: 2.8 g/dL — ABNORMAL LOW (ref 3.5–5.0)
Alkaline Phosphatase: 56 U/L (ref 38–126)
Anion gap: 8 (ref 5–15)
BUN: 25 mg/dL — ABNORMAL HIGH (ref 8–23)
CO2: 27 mmol/L (ref 22–32)
Calcium: 8.7 mg/dL — ABNORMAL LOW (ref 8.9–10.3)
Chloride: 92 mmol/L — ABNORMAL LOW (ref 98–111)
Creatinine, Ser: 0.78 mg/dL (ref 0.61–1.24)
GFR, Estimated: >60 mL/min (ref >60)
Glucose, Bld: 213 mg/dL — ABNORMAL HIGH (ref 70–99)
Potassium: 4.1 mmol/L (ref 3.5–5.1)
Sodium: 127 mmol/L — ABNORMAL LOW (ref 135–145)
Total Bilirubin: 1.4 mg/dL — ABNORMAL HIGH (ref 0.3–1.2)
Total Protein: 8.4 g/dL — ABNORMAL HIGH (ref 6.5–8.1)

## 2020-02-10 LAB — GLUCOSE, CAPILLARY
Glucose-Capillary: 201 mg/dL — ABNORMAL HIGH (ref 70–99)
Glucose-Capillary: 277 mg/dL — ABNORMAL HIGH (ref 70–99)
Glucose-Capillary: 172 mg/dL — ABNORMAL HIGH (ref 70–99)
Glucose-Capillary: 234 mg/dL — ABNORMAL HIGH (ref 70–99)

## 2020-02-10 LAB — OSMOLALITY, URINE: Osmolality, Ur: 834 mOsm/kg (ref 300–900)

## 2020-02-10 NOTE — Progress Notes (Signed)
Talked with Daughter concerning patient's well being. Concerned about lower back pain and father's sleep schedule. Explained current pain and sleep aid treatment. Both daughter and patient have been educated.

## 2020-02-10 NOTE — Progress Notes (Signed)
Inpatient Diabetes Program Recommendations  AACE/ADA: New Consensus Statement on Inpatient Glycemic Control   Target Ranges:  Prepandial:   less than 140 mg/dL      Peak postprandial:   less than 180 mg/dL (1-2 hours)      Critically ill patients:  140 - 180 mg/dL   Results for Jon Townsend, Jon Townsend" (MRN [id number]) as of 02/10/2020 10:19  Ref. Range 02/09/2020 06:16 02/09/2020 11:30 02/09/2020 16:33 02/09/2020 21:23 02/10/2020 06:05  Glucose-Capillary Latest Ref Range: 70 - 99 mg/dL 197 (H) 238 (H) 208 (H) 209 (H) 201 (H)   Review of Glycemic Control  Diabetes history: DM2 Outpatient Diabetes medications: Metformin XR 1000 mg daily Current orders for Inpatient glycemic control: Novolog 0-9 units TID with meals  Inpatient Diabetes Program Recommendations:    Insulin: Please consider ordering Novolog 3 units TID with meals for meal coverage if patient eats at least 50% of meals.  Thanks, Barnie Alderman, RN, MSN, CDE Diabetes Coordinator Inpatient Diabetes Program [phone] (Team Pager from 8am to 5pm)

## 2020-02-10 NOTE — Progress Notes (Signed)
PROGRESS NOTE    Jon Townsend  XBJ:[id number] DOB: 08-Sep-1955 DOA: 02/03/2020 PCP: [name], DO    Brief Narrative:  65 y.o. male with history of hypertension, hyperlipidemia, diabetes mellitus type 2 who has had Covid infection in March 2021 was given antibodies following which symptoms resolved has been having some upper respiratory tract symptoms last month for which patient received prednisone antibiotics and also last week for which again patient received prednisone.  Patient started experiencing tingling and numbness of the upper extremities on Sunday February 01, 2020 which slowly progressed proximally and also started involving the lower extremities.  At that point patient came to the ER was evaluated by neurologist and outpatient further work-up was recommended.  But since patient's symptoms got more worse with weakness of the lower extremities patient decided to come to the ER.  Patient also has been having low back pain denies any fever chills or any incontinence of urine or bowel.  Assessment & Plan:   Principal Problem:   Polyneuropathy Active Problems:   Diabetes mellitus type 2 in obese ([facility])   Weakness   Essential hypertension   COVID-19 virus infection  1. Guillain-Barr syndrome/polyneuropathy 1. Nephrology had been following, now signed off 2. Completed total 5 days of IVIG per Neurology 3. Awaiting possible CIR placement 2. Low back pain  1. MRI L-spine reviewed. Findings consistent with prior LP attempts noted 2. Little to no benefit with fentanyl IV 3. Will give trial of heat pack and toradol 4. PT/OT consulted thus far with recs for CIR. CIR following 5. Discussed with Neurology. Recommendation to avoid muscle relaxants. Instead, continue on PRN dilaudid with NSAIDs as needed and lidocaine patch  6. Transition to po oxycodone 7. Given trial of low dose neurontin 3. Covid infection positive but patient is asymptomatic.   1. Will follow  inflammatory markers 2. CRP down to 0.7 as of 1/30, pt remains asymptomatic, on room air 4. Diabetes mellitus type 2 1.  Will continue SSI coverage as needed. 5. Hypertension 1. continue on amlodipine and beta-blockers as tolerated 2. BP stable at this time 6. Hyperlipidemia on statins. 7. BPH, will continue on Flomax as tolerated 8. Hyponatremia 1. Initially thought secondary to IVIG recently given, now stopped 2. Clinically euvolemic 3. Will attempt fluid restriction 4. Repeat bmet in AM  DVT prophylaxis: SCD's Code Status: Full Family Communication: Pt in room, family not at bedside  Status is: Inpatient  Remains inpatient appropriate because:IV treatments appropriate due to intensity of illness or inability to take PO and Inpatient level of care appropriate due to severity of illness  Dispo: The patient is from: Home              Anticipated d/c is to: Home              Anticipated d/c date is: 3 days              Patient currently is not medically stable to d/c.   Difficult to place patient No   Consultants:   Neurology  Procedures:     Antimicrobials: Anti-infectives (From admission, onward)   None      Subjective: Complains of feeling hot this AM  Objective: Vitals:   02/10/20 0345 02/10/20 0830 02/10/20 1213 02/10/20 1627  BP: (!) 179/90 (!) 175/96 (!) 149/90 (!) 144/86  Pulse: 71 72 75 75  Resp: 18 18 20 18   Temp: 98.1 F (36.7 C) 98.3 F (36.8 C) 97.7 F (36.5 C) 98.2  F (36.8 C)  TempSrc: Oral Oral  Oral  SpO2: 98% 98% 98% 100%  Weight:      Height:        Intake/Output Summary (Last 24 hours) at 02/10/2020 1634 Last data filed at 02/10/2020 0600 Gross per 24 hour  Intake 240 ml  Output 1075 ml  Net -835 ml   Filed Weights   02/03/20 1228  Weight: 113.4 kg    Examination: General exam: Conversant, in no acute distress Respiratory system: normal chest rise, clear, no audible wheezing Cardiovascular system: regular rhythm,  s1-s2 Gastrointestinal system: Nondistended, nontender, pos BS Central nervous system: No seizures, no tremors Extremities: No cyanosis, no joint deformities Skin: No rashes, no pallor Psychiatry: Affect normal // no auditory hallucinations   Data Reviewed: I have personally reviewed following labs and imaging studies  CBC: Recent Labs  Lab 02/04/20 0140 02/05/20 0442 02/06/20 0539 02/07/20 0214 02/08/20 0038  WBC 10.8* 7.2 7.2 7.3 8.5  NEUTROABS 8.9* 5.5 5.6 5.4 6.3  HGB 16.2 15.9 14.8 15.2 15.8  HCT 46.4 47.5 44.2 43.1 46.3  MCV 89.9 91.2 90.4 88.9 89.7  PLT 208 186 181 200 [id number]   Basic Metabolic Panel: Recent Labs  Lab 02/06/20 0539 02/07/20 0214 02/08/20 0038 02/09/20 0652 02/10/20 0339  NA 131* 126* 127* 128* 127*  K 4.0 3.9 4.0 3.9 4.1  CL 98 96* 93* 95* 92*  CO2 25 24 25 25 27   GLUCOSE 200* 206* 184* 205* 213*  BUN 22 20 19  26* 25*  CREATININE 0.71 0.67 0.75 0.76 0.78  CALCIUM 8.3* 8.3* 8.6* 8.6* 8.7*   GFR: Estimated Creatinine Clearance: 119.9 mL/min (by C-G formula based on SCr of 0.78 mg/dL). Liver Function Tests: Recent Labs  Lab 02/06/20 0539 02/07/20 0214 02/08/20 0038 02/09/20 0652 02/10/20 0339  AST 23 23 25 29  32  ALT 21 22 23 30  34  ALKPHOS 60 57 61 63 56  BILITOT 1.1 1.3* 1.2 1.4* 1.4*  PROT 7.6 8.5* 8.7* 9.0* 8.4*  ALBUMIN 2.7* 2.8* 2.8* 2.9* 2.8*   No results for input(s): LIPASE, AMYLASE in the last 168 hours. No results for input(s): AMMONIA in the last 168 hours. Coagulation Profile: No results for input(s): INR, PROTIME in the last 168 hours. Cardiac Enzymes: Recent Labs  Lab 02/03/20 1738  CKTOTAL 216   BNP (last 3 results) No results for input(s): PROBNP in the last 8760 hours. HbA1C: No results for input(s): HGBA1C in the last 72 hours. CBG: Recent Labs  Lab 02/09/20 1633 02/09/20 2123 02/10/20 0605 02/10/20 1213 02/10/20 1625  GLUCAP 208* 209* 201* 277* 172*   Lipid Profile: No results for input(s): CHOL,  HDL, LDLCALC, TRIG, CHOLHDL, LDLDIRECT in the last 72 hours. Thyroid Function Tests: No results for input(s): TSH, T4TOTAL, FREET4, T3FREE, THYROIDAB in the last 72 hours. Anemia Panel: No results for input(s): VITAMINB12, FOLATE, FERRITIN, TIBC, IRON, RETICCTPCT in the last 72 hours. Sepsis Labs: No results for input(s): PROCALCITON, LATICACIDVEN in the last 168 hours.  No results found for this or any previous visit (from the past 240 hour(s)).   Radiology Studies: No results found.  Scheduled Meds: . alum & mag hydroxide-simeth  30 mL Oral Once  . amLODipine  5 mg Oral Daily  . atorvastatin  40 mg Oral QHS  . docusate sodium  100 mg Oral BID  . gabapentin  100 mg Oral BID  . insulin aspart  0-9 Units Subcutaneous TID WC  . latanoprost  1 drop Both Eyes  QHS  . lidocaine  1 patch Transdermal Q24H  . metoprolol tartrate  50 mg Oral BID  . polyethylene glycol  17 g Oral Daily  . tamsulosin  0.4 mg Oral Daily   Continuous Infusions:    LOS: 7 days   Marylu Lund, MD [facility] Pager On Amion  If 7PM-7AM, please contact night-coverage 02/10/2020, 4:34 PM

## 2020-02-10 NOTE — Progress Notes (Signed)
Inpatient Rehab Admissions Coordinator:    I met with this Pt at bedside and notified him that I have opened a case with Pt.'s new insurance [facility]) that went into effect today. Pre-authorization typically takes 3-4 days with [facility], so I do not anticipate a response until the end of the week. I reviewed rehab MD's consult with pt. And his wife. Rehab MD anticipates that after 2-3 weeks of intensive rehab on CIR, Pt. Would be ale to reach min assist level with PT and OT.  I reviewed the fact that Pt.'s wife will need to be able to provide at least min assist (up to 25% assist)  for all ambulation, transfers, and activities of daily living. Pt.'s wife had recent hip fracture and is still healing and would like time to think over whether she can provide this level of assist. If pt.'s wife is not able to provide min assist, I would recommend that Pt. Pursue short term rehab at [facility], as [facility] will not pay for him to go to CIR and then SNF.  I have also reached out to patient's daughter and left a voicemail with request for callback.   Clemens Catholic, [place], [place] Admissions Coordinator  [phone] ([place]) [phone] (office)

## 2020-02-10 NOTE — Progress Notes (Signed)
Physical Therapy Treatment Patient Details Name: Jon Townsend MRN: [id number] DOB: 01-24-1955 Today's Date: 02/10/2020    History of Present Illness 65 y.o. male with history of hypertension, hyperlipidemia, diabetes mellitus type 2 who has had Covid infection in March 2021 was given antibodies following which symptoms resolved has been having some upper respiratory tract symptoms last month. Patient started experiencing tingling and numbness of the upper extremities on Sunday February 01, 2020 which slowly progressed proximally and also started involving the lower extremities. Admitted for polyneuropathy workup.    PT Comments    Pt reports numbness/tingling to be better than yesterday but still present in addition to pain in bilat LEs with movement. Pt with improved overall strength and required less assist to stand in stedy today, pt completed 2 stands at 30 sec and 1 min and then a stand with marching x10 reps. Pt diaphoretic and reporting "I am wore out, I am so weak". Will progress to sit to stand up to RW next session. Pt remains appropriate for CIR upon d/c for maximal functional recovery. Acute PT to cont to follow.    Follow Up Recommendations  CIR     Equipment Recommendations  Rolling walker with 5" wheels    Recommendations for Other Services Rehab consult     Precautions / Restrictions Precautions Precautions: Fall Precaution Comments: contact and airborne precautions Restrictions Weight Bearing Restrictions: No    Mobility  Bed Mobility Overal bed mobility: Needs Assistance Bed Mobility: Supine to Sit     Supine to sit: Mod assist;HOB elevated     General bed mobility comments: pt able to bring LEs off EOB without assist, pt unable to sequence use of R UE to reach across for L bed rail, modA for trunk elevation  Transfers Overall transfer level: Needs assistance Equipment used: Ambulation equipment used Transfers: Sit to/from Stand Sit to Stand: Min assist  (minA with use of stedy and bed elevated, +2 when not using stedy) Stand pivot transfers: Total assist;+2 safety/equipment       General transfer comment: used stedy for transfer however pt able to march in place in stedy  Ambulation/Gait             General Gait Details: unable to amb at this time but was able to march x 10 reps total in stedy with support of bilat UEs and tactile cues by PT at gluts and chest to maintain upright posture   Stairs             Wheelchair Mobility    Modified Rankin (Stroke Patients Only)       Balance Overall balance assessment: Needs assistance Sitting-balance support: Single extremity supported;Feet supported;No upper extremity supported Sitting balance-Leahy Scale: Fair Sitting balance - Comments: Static sitting EOB, no LOB with supervision for safety, min guard for balance for LE ther ex   Standing balance support: Bilateral upper extremity supported Standing balance-Leahy Scale: Poor Standing balance comment: dependent on external support                            Cognition Arousal/Alertness: Awake/alert Behavior During Therapy: Anxious (re: deficits) Overall Cognitive Status: Impaired/Different from baseline Area of Impairment: Problem solving                             Problem Solving: Slow processing;Difficulty sequencing;Requires verbal cues;Requires tactile cues General Comments: pt anxious re: onset of pain  in LEs with movement, pt with dificulty sequencing transfer to EOB      Exercises General Exercises - Lower Extremity Ankle Circles/Pumps: AROM;Both;10 reps Long Arc Quad: AROM;Both;10 reps;Strengthening Heel Slides: AROM;Both;10 reps;Seated (with theraband) Hip Flexion/Marching: Strengthening;Seated;10 reps;Right    General Comments General comments (skin integrity, edema, etc.): VSS      Pertinent Vitals/Pain Pain Assessment: 0-10 Faces Pain Scale: Hurts even more Pain  Location: bilat LEs with mobility Pain Descriptors / Indicators: Aching;Tightness Pain Intervention(s): Monitored during session    Home Living                      Prior Function            PT Goals (current goals can now be found in the care plan section) Progress towards PT goals: Progressing toward goals    Frequency    Min 4X/week      PT Plan Current plan remains appropriate    Co-evaluation              AM-PAC PT "6 Clicks" Mobility   Outcome Measure  Help needed turning from your back to your side while in a flat bed without using bedrails?: A Lot Help needed moving from lying on your back to sitting on the side of a flat bed without using bedrails?: A Lot Help needed moving to and from a bed to a chair (including a wheelchair)?: A Lot Help needed standing up from a chair using your arms (e.g., wheelchair or bedside chair)?: A Lot Help needed to walk in hospital room?: Total Help needed climbing 3-5 steps with a railing? : Total 6 Click Score: 10    End of Session   Activity Tolerance: Patient tolerated treatment well Patient left: in chair;with call bell/phone within reach;with chair alarm set Nurse Communication: Mobility status;Need for lift equipment PT Visit Diagnosis: Other abnormalities of gait and mobility (R26.89);Muscle weakness (generalized) (M62.81);Other symptoms and signs involving the nervous system (R29.898);Unsteadiness on feet (R26.81);Difficulty in walking, not elsewhere classified (R26.2);Pain     Time: 5170-0174 PT Time Calculation (min) (ACUTE ONLY): 30 min  Charges:  $Therapeutic Exercise: 8-22 mins $Therapeutic Activity: 8-22 mins                     Kittie Plater, PT, DPT Acute Rehabilitation Services Pager #: [phone] Office #: [phone]    Berline Lopes 02/10/2020, 1:30 PM

## 2020-02-11 DIAGNOSIS — E669 Obesity, unspecified: Secondary | ICD-10-CM | POA: Diagnosis not present

## 2020-02-11 DIAGNOSIS — U071 COVID-19: Secondary | ICD-10-CM | POA: Diagnosis not present

## 2020-02-11 DIAGNOSIS — E1169 Type 2 diabetes mellitus with other specified complication: Secondary | ICD-10-CM | POA: Diagnosis not present

## 2020-02-11 LAB — GLUCOSE, CAPILLARY
Glucose-Capillary: 189 mg/dL — ABNORMAL HIGH (ref 70–99)
Glucose-Capillary: 255 mg/dL — ABNORMAL HIGH (ref 70–99)
Glucose-Capillary: 187 mg/dL — ABNORMAL HIGH (ref 70–99)
Glucose-Capillary: 254 mg/dL — ABNORMAL HIGH (ref 70–99)
Glucose-Capillary: 165 mg/dL — ABNORMAL HIGH (ref 70–99)

## 2020-02-11 LAB — COMPREHENSIVE METABOLIC PANEL
ALT: 35 U/L (ref 0–44)
AST: 32 U/L (ref 15–41)
Albumin: 2.8 g/dL — ABNORMAL LOW (ref 3.5–5.0)
Alkaline Phosphatase: 60 U/L (ref 38–126)
Anion gap: 7 (ref 5–15)
BUN: 27 mg/dL — ABNORMAL HIGH (ref 8–23)
CO2: 26 mmol/L (ref 22–32)
Calcium: 8.7 mg/dL — ABNORMAL LOW (ref 8.9–10.3)
Chloride: 96 mmol/L — ABNORMAL LOW (ref 98–111)
Creatinine, Ser: 0.81 mg/dL (ref 0.61–1.24)
GFR, Estimated: >60 mL/min (ref >60)
Glucose, Bld: 178 mg/dL — ABNORMAL HIGH (ref 70–99)
Potassium: 4.2 mmol/L (ref 3.5–5.1)
Sodium: 129 mmol/L — ABNORMAL LOW (ref 135–145)
Total Bilirubin: 1.2 mg/dL (ref 0.3–1.2)
Total Protein: 8 g/dL (ref 6.5–8.1)

## 2020-02-11 LAB — VITAMIN B1: Vitamin B1 (Thiamine): 192 nmol/L (ref 66.5–200.0)

## 2020-02-11 MED ORDER — SENNOSIDES-DOCUSATE SODIUM 8.6-50 MG PO TABS
2.0000 | ORAL_TABLET | Freq: Two times a day (BID) | ORAL | Status: DC
  Administered 2020-02-11 – 2020-02-14 (×7): 2 via ORAL
  Filled 2020-02-11 (×7): qty 2

## 2020-02-11 MED ORDER — FLEET ENEMA 7-19 GM/118ML RE ENEM
1.0000 | ENEMA | Freq: Once | RECTAL | Status: AC
  Administered 2020-02-11: 1 via RECTAL
  Filled 2020-02-11: qty 1

## 2020-02-11 MED ORDER — OXYCODONE HCL 5 MG PO TABS
10.0000 mg | ORAL_TABLET | ORAL | Status: DC | PRN
  Administered 2020-02-11 – 2020-02-19 (×24): 10 mg via ORAL
  Filled 2020-02-11 (×25): qty 2

## 2020-02-11 NOTE — Progress Notes (Signed)
Physical Therapy Treatment Patient Details Name: Jon Townsend MRN: [id number] DOB: [birth year] Today's Date: 02/11/2020    History of Present Illness 65 y.o. male with history of hypertension, hyperlipidemia, diabetes mellitus type 2 who has had Covid infection in March 2021 was given antibodies following which symptoms resolved has been having some upper respiratory tract symptoms last month. Patient started experiencing tingling and numbness of the upper extremities on Sunday February 01, 2020 which slowly progressed proximally and also started involving the lower extremities. Admitted for polyneuropathy workup.    PT Comments    Pt tolerates treatment well despite continued tingling and pins and needles sensation in BLE. Pt demonstrates improvements in LE strength and is able to perform very brief periods of SLS on each leg to initiate stepping at edge of bed. Pt continues to require significant physical assistance to power up into standing and to prevent falls during pivot transfers. PT provides progression of HEP, adding bridges and rows to improve trunk and LE strength. Pt will continue to benefit from acute PT POC to improve strength and reduce falls risk. PT continues to recommend CIR placement.   Follow Up Recommendations  CIR     Equipment Recommendations  Rolling walker with 5" wheels    Recommendations for Other Services       Precautions / Restrictions Precautions Precautions: Fall Precaution Comments: contact and airborne precautions Restrictions Weight Bearing Restrictions: No    Mobility  Bed Mobility Overal bed mobility: Needs Assistance Bed Mobility: Rolling;Sit to Supine Rolling: Supervision   Supine to sit: Min guard;HOB elevated Sit to supine: Min assist   General bed mobility comments: PT provides cues for rolling to bend contralateral knee to aide in completing roll. Pt requires assistance for LE management in sit to supine  Transfers Overall transfer  level: Needs assistance Equipment used: 1 person hand held assist Transfers: Sit to/from [employer] Sit to Stand: Mod assist;From elevated surface   Squat pivot transfers: Max assist     General transfer comment: pt requires assistance to power up into standing, PT provides L knee block  Ambulation/Gait                 Stairs             Wheelchair Mobility    Modified Rankin (Stroke Patients Only)       Balance Overall balance assessment: Needs assistance Sitting-balance support: No upper extremity supported;Feet supported Sitting balance-Leahy Scale: Poor Sitting balance - Comments: requires UE support for dynamic sitting balance   Standing balance support: Bilateral upper extremity supported Standing balance-Leahy Scale: Poor Standing balance comment: modA with BUE support and L knee block                            Cognition Arousal/Alertness: Awake/alert Behavior During Therapy: WFL for tasks assessed/performed;Anxious Overall Cognitive Status: Within Functional Limits for tasks assessed                                 General Comments: overall WFL for mobility tasks, lots of questions about how long pt will be in CIR, perseverating on wanting to get better      Exercises General Exercises - Lower Extremity Ankle Circles/Pumps: AROM;Both;10 reps Gluteal Sets: AROM;Both;10 reps Heel Slides: AROM;Both;5 reps Other Exercises Other Exercises: bridges from supine x5 Other Exercises: rows from semi-fowlers x5 with use of  rails    General Comments General comments (skin integrity, edema, etc.): VSS on RA      Pertinent Vitals/Pain Pain Assessment: Faces Faces Pain Scale: Hurts little more Pain Location: BLE tingling, pins and needles Pain Descriptors / Indicators: Tingling;Pins and needles Pain Intervention(s): Monitored during session    Home Living                      Prior Function             PT Goals (current goals can now be found in the care plan section) Acute Rehab PT Goals Patient Stated Goal: to return to independent mobility Progress towards PT goals: Progressing toward goals    Frequency    Min 4X/week      PT Plan Current plan remains appropriate    Co-evaluation              AM-PAC PT "6 Clicks" Mobility   Outcome Measure  Help needed turning from your back to your side while in a flat bed without using bedrails?: A Little Help needed moving from lying on your back to sitting on the side of a flat bed without using bedrails?: A Lot Help needed moving to and from a bed to a chair (including a wheelchair)?: A Lot Help needed standing up from a chair using your arms (e.g., wheelchair or bedside chair)?: A Lot Help needed to walk in hospital room?: Total Help needed climbing 3-5 steps with a railing? : Total 6 Click Score: 11    End of Session   Activity Tolerance: Patient tolerated treatment well Patient left: in bed;with call bell/phone within reach;with bed alarm set Nurse Communication: Mobility status;Need for lift equipment (STEDY) PT Visit Diagnosis: Other abnormalities of gait and mobility (R26.89);Muscle weakness (generalized) (M62.81);Other symptoms and signs involving the nervous system (R29.898);Unsteadiness on feet (R26.81);Difficulty in walking, not elsewhere classified (R26.2);Pain Pain - part of body: Leg (BLE)     Time: 8891-6945 PT Time Calculation (min) (ACUTE ONLY): 30 min  Charges:  $Therapeutic Exercise: 8-22 mins $Therapeutic Activity: 8-22 mins                     Zenaida Niece, PT, DPT Acute Rehabilitation Pager: [phone]    Zenaida Niece 02/11/2020, 12:55 PM

## 2020-02-11 NOTE — Progress Notes (Signed)
PROGRESS NOTE  Jon Townsend UJW:[id number] DOB: [birth year] DOA: 02/03/2020 PCP: [name], DO   LOS: 8 days   Brief Narrative / Interim history:  65 y.o.malewithhistory of hypertension, hyperlipidemia, diabetes mellitus type 2 who has had Covid infection in March 2021 was given antibodies following which symptoms resolved has been having some upper respiratory tract symptoms last month for which patient received prednisone antibiotics and also last week for which again patient received prednisone. Patient started experiencing tingling and numbness of the upper extremities on "Sunday February 01, 2020 which slowly progressed proximally and also started involving the lower extremities. At that point patient came to the ER was evaluated by neurologist and outpatient further work-up was recommended. But since patient's symptoms got more worse with weakness of the lower extremities patient decided to come to the ER. Patient also has been having low back pain denies any fever chills or any incontinence of urine or bowel.  He was diagnosed with Guillain-Barr syndrome status post IVIG  Subjective / 24h Interval events: Overall feeling much stronger.  Has significant pain and intermittent bilateral lower extremity numbness  Assessment & Plan: Principal Problem Guillain-Barr syndrome, polyneuropathy-neurology consulted, he is status post 5 days of IVIG.  Much improved, PT recommended CIR, placement pending  Active Problems Low back pain-increase oxycodone, would favor that versus IV pain medications  COVID-19 infection-asymptomatic, needs only 10 days of isolation  DM2-continue sliding scale  Hypertension-continue medications as below  Hyperlipidemia-continue statin  Hyponatremia-euvolemic, continue fluid restriction, stable  Scheduled Meds: . alum & mag hydroxide-simeth  30 mL Oral Once  . amLODipine  5 mg Oral Daily  . atorvastatin  40 mg Oral QHS  . docusate sodium   100 mg Oral BID  . gabapentin  100 mg Oral BID  . insulin aspart  0-9 Units Subcutaneous TID WC  . latanoprost  1 drop Both Eyes QHS  . lidocaine  1 patch Transdermal Q24H  . metoprolol tartrate  50 mg Oral BID  . polyethylene glycol  17 g Oral Daily  . senna-docusate  2 tablet Oral BID  . tamsulosin  0.4 mg Oral Daily   Continuous Infusions: PRN Meds:.acetaminophen **OR** acetaminophen, HYDROmorphone (DILAUDID) injection, melatonin, oxyCODONE  Diet Orders (From admission, onward)    Start     Ordered   02/10/20 1243  Diet heart healthy/carb modified Room service appropriate? Yes; Fluid consistency: Thin; Fluid restriction: 1500 mL Fluid  Diet effective now       Question Answer Comment  Diet-HS Snack? Nothing   Room service appropriate? Yes   Fluid consistency: Thin   Fluid restriction: 1500 mL Fluid      02" /01/22 1242          DVT prophylaxis: SCDs Start: 02/03/20 2129     Code Status: Full Code  Family Communication: no faimly at bedside   Status is: Inpatient  Remains inpatient appropriate because:Inpatient level of care appropriate due to severity of illness   Dispo: The patient is from: Home              Anticipated d/c is to: CIR              Anticipated d/c date is: 2 days              Patient currently is medically stable to d/c.   Difficult to place patient No  Level of care: Telemetry Medical  Consultants:  eurology   Procedures:  None   Microbiology  None  Antimicrobials: None     Objective: Vitals:   02/10/20 2320 02/11/20 0318 02/11/20 0846 02/11/20 1107  BP: 124/75 (!) 145/75 (!) 144/84 104/77  Pulse: 68 71 84 89  Resp: 18 18 18 18   Temp: 98.3 F (36.8 C) 98 F (36.7 C) 98 F (36.7 C) 98 F (36.7 C)  TempSrc: Oral Oral Oral Oral  SpO2: 99% 97% 99% 97%  Weight:      Height:        Intake/Output Summary (Last 24 hours) at 02/11/2020 1417 Last data filed at 02/11/2020 0013 Gross per 24 hour  Intake 240 ml  Output 500 ml  Net  -260 ml   Filed Weights   02/03/20 1228  Weight: 113.4 kg    Examination:  Constitutional: NAD Eyes: no scleral icterus ENMT: Mucous membranes are moist.  Neck: normal, supple Respiratory: clear to auscultation bilaterally, no wheezing, no crackles. Normal respiratory effort. No accessory muscle use.  Cardiovascular: Regular rate and rhythm, no murmurs / rubs / gallops. No LE edema.  Abdomen: non distended, no tenderness. Bowel sounds positive.  Musculoskeletal: no clubbing / cyanosis.  Skin: no rashes Neurologic: Nonfocal, equal weakness bilateral lower extremities   Data Reviewed: I have independently reviewed following labs and imaging studies   CBC: Recent Labs  Lab 02/05/20 0442 02/06/20 0539 02/07/20 0214 02/08/20 0038  WBC 7.2 7.2 7.3 8.5  NEUTROABS 5.5 5.6 5.4 6.3  HGB 15.9 14.8 15.2 15.8  HCT 47.5 44.2 43.1 46.3  MCV 91.2 90.4 88.9 89.7  PLT 186 181 200 884   Basic Metabolic Panel: Recent Labs  Lab 02/07/20 0214 02/08/20 0038 02/09/20 0652 02/10/20 0339 02/11/20 0405  NA 126* 127* 128* 127* 129*  K 3.9 4.0 3.9 4.1 4.2  CL 96* 93* 95* 92* 96*  CO2 24 25 25 27 26   GLUCOSE 206* 184* 205* 213* 178*  BUN 20 19 26* 25* 27*  CREATININE 0.67 0.75 0.76 0.78 0.81  CALCIUM 8.3* 8.6* 8.6* 8.7* 8.7*   Liver Function Tests: Recent Labs  Lab 02/07/20 0214 02/08/20 0038 02/09/20 0652 02/10/20 0339 02/11/20 0405  AST 23 25 29  32 32  ALT 22 23 30  34 35  ALKPHOS 57 61 63 56 60  BILITOT 1.3* 1.2 1.4* 1.4* 1.2  PROT 8.5* 8.7* 9.0* 8.4* 8.0  ALBUMIN 2.8* 2.8* 2.9* 2.8* 2.8*   Coagulation Profile: No results for input(s): INR, PROTIME in the last 168 hours. HbA1C: No results for input(s): HGBA1C in the last 72 hours. CBG: Recent Labs  Lab 02/10/20 1625 02/10/20 2106 02/11/20 0611 02/11/20 1102 02/11/20 1306  GLUCAP 172* 234* 189* 255* 187*    No results found for this or any previous visit (from the past 240 hour(s)).   Radiology Studies: No  results found.  Marzetta Board, MD, PhD [facility]  Between 7 am - 7 pm I am available, please contact me via Amion or Securechat  Between 7 pm - 7 am I am not available, please contact night coverage MD/APP via Amion

## 2020-02-11 NOTE — Progress Notes (Signed)
Occupational Therapy Treatment Patient Details Name: Jon Townsend MRN: [id number] DOB: 07/01/1955 Today's Date: 02/11/2020    History of present illness 65 y.o. male with history of hypertension, hyperlipidemia, diabetes mellitus type 2 who has had Covid infection in March 2021 was given antibodies following which symptoms resolved has been having some upper respiratory tract symptoms last month. Patient started experiencing tingling and numbness of the upper extremities on Sunday February 01, 2020 which slowly progressed proximally and also started involving the lower extremities. Admitted for polyneuropathy workup.   OT comments  Pt making steady progress towards OT goals this session. Session focus on functional mobility as precursor to higher level BADLs, and BADL reeducation. Pt continues to present with generalized deconditioning, decreased activity tolerance and generalized BUE weakness ( RUE>LUE) impacting pts ability to complete BADLs independently.Pt able to stand to stedy this session with MIN A +1, pt stood in stedy ~ 30 secs for pregait activities before fatiguing and needing to sit. Pt continues to present with [facility] deficits however pt with good carryover of compensatory methods during grooming tasks from previous OT session. Pt would continue to benefit from skilled occupational therapy while admitted and after d/c to address the below listed limitations in order to improve overall functional mobility and facilitate independence with BADL participation. DC plan remains appropriate, will follow acutely per POC.     Follow Up Recommendations  CIR;Supervision/Assistance - 24 hour    Equipment Recommendations       Recommendations for Other Services      Precautions / Restrictions Precautions Precautions: Fall Precaution Comments: contact and airborne precautions Restrictions Weight Bearing Restrictions: No       Mobility Bed Mobility Overal bed mobility: Needs  Assistance Bed Mobility: Supine to Sit     Supine to sit: Min guard;HOB elevated     General bed mobility comments: pt able to exit to L side of bed with min guard for safety with HOB elevated and use of bed rail  Transfers Overall transfer level: Needs assistance Equipment used: Ambulation equipment used Transfers: Sit to/from Stand Sit to Stand: Min assist;From elevated surface         General transfer comment: MIN A to power up from EOB with stedy    Balance Overall balance assessment: Needs assistance Sitting-balance support: Feet supported;No upper extremity supported Sitting balance-Leahy Scale: Fair Sitting balance - Comments: pt able to sit EOB with no UE support with no LOB   Standing balance support: Bilateral upper extremity supported Standing balance-Leahy Scale: Poor Standing balance comment: reliant on BUE support, pt able to stand in stedy to complete pregait marching and weight shifting with BUE supported ~ 30 secs                           ADL either performed or assessed with clinical judgement   ADL Overall ADL's : Needs assistance/impaired     Grooming: Wash/dry face;Oral care;Sitting;Set up Grooming Details (indicate cue type and reason): sitting in recliner, pt reporting being R handed but using hand over hand assist to brush teeth d/t RUE weakness. good carryover of compensatory methods from previous OT session     Lower Body Bathing: Moderate assistance;Sitting/lateral leans Lower Body Bathing Details (indicate cue type and reason): pt able to reach down to LB from sitting EOB but requries MOD A for cleanliness     Lower Body Dressing: Moderate assistance;Sitting/lateral leans Lower Body Dressing Details (indicate cue type and reason): pt  able to reach to BLEs from EOB but requires MOD A d/t fatigur from reaching out of BOS Toilet Transfer: Total assistance;Minimal assistance Toilet Transfer Details (indicate cue type and reason):  simualated via pivot transfer to recliner with stedy, MIN A to stand to stedy with use of momentum and total A for the pivot with use of stedy         Functional mobility during ADLs: Minimal assistance (sit<>stand only from EOB with stedy) General ADL Comments: pt continues to present with generalized deconditioning, decreased activity tolerance and generalized weakness ( RUE>LUE) impacting pts ability to complete BADLs independently     Vision Baseline Vision/History: Wears glasses Wears Glasses: At all times Patient Visual Report: No change from baseline     Perception     Praxis      Cognition Arousal/Alertness: Awake/alert Behavior During Therapy: WFL for tasks assessed/performed;Anxious (anxious about getting better and on to the next venue of care) Overall Cognitive Status: Within Functional Limits for tasks assessed                                 General Comments: overall WFL for mobility tasks, lots of questions about how long pt will be in CIR, perseverating on wanting to get better        Exercises Other Exercises Other Exercises: standing marching in standing with BUE supported x10 reps   Shoulder Instructions       General Comments      Pertinent Vitals/ Pain       Pain Assessment: Faces Faces Pain Scale: Hurts a little bit Pain Location: back from lying in bed Pain Descriptors / Indicators: Sore;Tightness Pain Intervention(s): Monitored during session;Repositioned  Home Living                                          Prior Functioning/Environment              Frequency  Min 2X/week        Progress Toward Goals  OT Goals(current goals can now be found in the care plan section)  Progress towards OT goals: Progressing toward goals  Acute Rehab OT Goals Patient Stated Goal: to return to independent mobility OT Goal Formulation: With patient Time For Goal Achievement: 02/19/20 Potential to Achieve Goals:  Good  Plan Discharge plan remains appropriate;Frequency remains appropriate    Co-evaluation                 AM-PAC OT "6 Clicks" Daily Activity     Outcome Measure   Help from another person eating meals?: A Little (set- up of food and built up handle on utensils) Help from another person taking care of personal grooming?: A Little (set- up) Help from another person toileting, which includes using toliet, bedpan, or urinal?: A Lot Help from another person bathing (including washing, rinsing, drying)?: A Lot Help from another person to put on and taking off regular upper body clothing?: A Little Help from another person to put on and taking off regular lower body clothing?: A Lot 6 Click Score: 15    End of Session    OT Visit Diagnosis: Unsteadiness on feet (R26.81);Other abnormalities of gait and mobility (R26.89);Muscle weakness (generalized) (M62.81)   Activity Tolerance Patient tolerated treatment well   Patient Left in chair;with call bell/phone  within reach;with chair alarm set   Nurse Communication Mobility status        Time: [phone] OT Time Calculation (min): 18 min  Charges: OT General Charges $OT Visit: 1 Visit OT Treatments $Self Care/Home Management : 8-22 mins  Lenor Derrick., COTA/L Acute Rehabilitation Services [phone]    Barron Schmid 02/11/2020, 10:50 AM

## 2020-02-12 DIAGNOSIS — U071 COVID-19: Secondary | ICD-10-CM | POA: Diagnosis not present

## 2020-02-12 DIAGNOSIS — E1169 Type 2 diabetes mellitus with other specified complication: Secondary | ICD-10-CM | POA: Diagnosis not present

## 2020-02-12 DIAGNOSIS — E669 Obesity, unspecified: Secondary | ICD-10-CM | POA: Diagnosis not present

## 2020-02-12 LAB — BASIC METABOLIC PANEL
Anion gap: 8 (ref 5–15)
BUN: 25 mg/dL — ABNORMAL HIGH (ref 8–23)
CO2: 23 mmol/L (ref 22–32)
Calcium: 8.8 mg/dL — ABNORMAL LOW (ref 8.9–10.3)
Chloride: 97 mmol/L — ABNORMAL LOW (ref 98–111)
Creatinine, Ser: 0.81 mg/dL (ref 0.61–1.24)
GFR, Estimated: >60 mL/min (ref >60)
Glucose, Bld: 178 mg/dL — ABNORMAL HIGH (ref 70–99)
Potassium: 4.5 mmol/L (ref 3.5–5.1)
Sodium: 128 mmol/L — ABNORMAL LOW (ref 135–145)

## 2020-02-12 LAB — CBC
HCT: 44.3 % (ref 39.0–52.0)
Hemoglobin: 15.1 g/dL (ref 13.0–17.0)
MCH: 30.4 pg (ref 26.0–34.0)
MCHC: 34.1 g/dL (ref 30.0–36.0)
MCV: 89.1 fL (ref 80.0–100.0)
Platelets: 150 10*3/uL (ref 150–400)
RBC: 4.97 MIL/uL (ref 4.22–5.81)
RDW: 12.2 % (ref 11.5–15.5)
WBC: 7.3 10*3/uL (ref 4.0–10.5)
nRBC: 0 % (ref 0.0–0.2)

## 2020-02-12 LAB — GLUCOSE, CAPILLARY
Glucose-Capillary: 156 mg/dL — ABNORMAL HIGH (ref 70–99)
Glucose-Capillary: 148 mg/dL — ABNORMAL HIGH (ref 70–99)
Glucose-Capillary: 242 mg/dL — ABNORMAL HIGH (ref 70–99)
Glucose-Capillary: 160 mg/dL — ABNORMAL HIGH (ref 70–99)
Glucose-Capillary: 189 mg/dL — ABNORMAL HIGH (ref 70–99)

## 2020-02-12 MED ORDER — FEXOFENADINE HCL 60 MG PO TABS
60.0000 mg | ORAL_TABLET | Freq: Two times a day (BID) | ORAL | Status: DC
  Administered 2020-02-12 – 2020-02-19 (×15): 60 mg via ORAL
  Filled 2020-02-12 (×16): qty 1

## 2020-02-12 MED ORDER — LORATADINE 10 MG PO TABS: 10.0000 mg | ORAL_TABLET | Freq: Every day | ORAL | Status: DC

## 2020-02-12 MED ORDER — OXYMETAZOLINE HCL 0.05 % NA SOLN
1.0000 | Freq: Two times a day (BID) | NASAL | Status: DC
  Administered 2020-02-12: 1 via NASAL
  Filled 2020-02-12: qty 30

## 2020-02-12 NOTE — Progress Notes (Signed)
PROGRESS NOTE  Jon Townsend SAY:[id number] DOB: 1955/02/13 DOA: 02/03/2020 PCP: [name], DO   LOS: 9 days   Brief Narrative / Interim history:  64 y.o.malewithhistory of hypertension, hyperlipidemia, diabetes mellitus type 2 who has had Covid infection in March 2021 was given antibodies following which symptoms resolved has been having some upper respiratory tract symptoms last month for which patient received prednisone antibiotics and also last week for which again patient received prednisone. Patient started experiencing tingling and numbness of the upper extremities on "Sunday February 01, 2020 which slowly progressed proximally and also started involving the lower extremities. At that point patient came to the ER was evaluated by neurologist and outpatient further work-up was recommended. But since patient's symptoms got more worse with weakness of the lower extremities patient decided to come to the ER. Patient also has been having low back pain denies any fever chills or any incontinence of urine or bowel.  He was diagnosed with Guillain-Barr syndrome status post IVIG  Subjective / 24h Interval events: Feeling better.  Overall less pain but still feels feels like he needs better control.  Assessment & Plan: Principal Problem Guillain-Barr syndrome, polyneuropathy-neurology consulted, he is status post 5 days of IVIG.  Much improved, PT recommended CIR, placement pending, hopefully tomorrow as he will come off 10 days of isolation on 2/4.  Active Problems Low back pain-increase oxycodone, would favor that versus IV pain medications  COVID-19 infection-asymptomatic, needs only 10 days of isolation.  Tested +1/24, that is day 0, today is day 10 and will discontinue isolation 2/4  DM2-continue sliding scale  CBG (last 3)  Recent Labs    02/11/20 2106 02/12/20 0603 02/12/20 0807  GLUCAP 165* 156* 148*   Hypertension-continue medications as  below  Hyperlipidemia-continue statin  Hyponatremia-euvolemic, continue fluid restriction, stable  Scheduled Meds: . alum & mag hydroxide-simeth  30 mL Oral Once  . amLODipine  5 mg Oral Daily  . atorvastatin  40 mg Oral QHS  . docusate sodium  100 mg Oral BID  . gabapentin  100 mg Oral BID  . insulin aspart  0-9 Units Subcutaneous TID WC  . latanoprost  1 drop Both Eyes QHS  . lidocaine  1 patch Transdermal Q24H  . metoprolol tartrate  50 mg Oral BID  . oxymetazoline  1 spray Each Nare BID  . polyethylene glycol  17 g Oral Daily  . senna-docusate  2 tablet Oral BID  . tamsulosin  0.4 mg Oral Daily   Continuous Infusions: PRN Meds:.acetaminophen **OR** acetaminophen, HYDROmorphone (DILAUDID) injection, melatonin, oxyCODONE  Diet Orders (From admission, onward)    Start     Ordered   02/10/20 1243  Diet heart healthy/carb modified Room service appropriate? Yes; Fluid consistency: Thin; Fluid restriction: 1500 mL Fluid  Diet effective now       Question Answer Comment  Diet-HS Snack? Nothing   Room service appropriate? Yes   Fluid consistency: Thin   Fluid restriction: 1500 mL Fluid      02" /01/22 1242          DVT prophylaxis: SCDs Start: 02/03/20 2129     Code Status: Full Code  Family Communication: no faimly at bedside   Status is: Inpatient  Remains inpatient appropriate because:Inpatient level of care appropriate due to severity of illness   Dispo: The patient is from: Home              Anticipated d/c is to: CIR  Anticipated d/c date is: 2 days              Patient currently is medically stable to d/c.   Difficult to place patient No  Level of care: Telemetry Medical  Consultants:  eurology   Procedures:  None   Microbiology  None   Antimicrobials: None     Objective: Vitals:   02/11/20 1941 02/11/20 2320 02/12/20 0312 02/12/20 0747  BP: 132/82 130/74 (!) 153/78 (!) 145/79  Pulse: 92 84 80 81  Resp: 16 18 18 16   Temp: 98.4  F (36.9 C) 98.4 F (36.9 C) 98.5 F (36.9 C) 98.3 F (36.8 C)  TempSrc: Oral Oral Oral Oral  SpO2: 97% 95% 96% 96%  Weight:      Height:        Intake/Output Summary (Last 24 hours) at 02/12/2020 0947 Last data filed at 02/12/2020 0417 Gross per 24 hour  Intake 120 ml  Output 850 ml  Net -730 ml   Filed Weights   02/03/20 1228  Weight: 113.4 kg    Examination:  Constitutional: No distress Eyes: No icterus ENMT: mmm Neck: normal, supple Respiratory: Lungs are clear bilaterally, no wheezing or crackles Cardiovascular: Regular rate and rhythm, no murmurs, no peripheral edema Abdomen: Soft, NT, ND, bowel sounds positive Musculoskeletal: no clubbing / cyanosis.  Skin: No rashes seen Neurologic: No focal deficits   Data Reviewed: I have independently reviewed following labs and imaging studies   CBC: Recent Labs  Lab 02/06/20 0539 02/07/20 0214 02/08/20 0038 02/12/20 0333  WBC 7.2 7.3 8.5 7.3  NEUTROABS 5.6 5.4 6.3  --   HGB 14.8 15.2 15.8 15.1  HCT 44.2 43.1 46.3 44.3  MCV 90.4 88.9 89.7 89.1  PLT 181 200 211 564   Basic Metabolic Panel: Recent Labs  Lab 02/08/20 0038 02/09/20 0652 02/10/20 0339 02/11/20 0405 02/12/20 0333  NA 127* 128* 127* 129* 128*  K 4.0 3.9 4.1 4.2 4.5  CL 93* 95* 92* 96* 97*  CO2 25 25 27 26 23   GLUCOSE 184* 205* 213* 178* 178*  BUN 19 26* 25* 27* 25*  CREATININE 0.75 0.76 0.78 0.81 0.81  CALCIUM 8.6* 8.6* 8.7* 8.7* 8.8*   Liver Function Tests: Recent Labs  Lab 02/07/20 0214 02/08/20 0038 02/09/20 0652 02/10/20 0339 02/11/20 0405  AST 23 25 29  32 32  ALT 22 23 30  34 35  ALKPHOS 57 61 63 56 60  BILITOT 1.3* 1.2 1.4* 1.4* 1.2  PROT 8.5* 8.7* 9.0* 8.4* 8.0  ALBUMIN 2.8* 2.8* 2.9* 2.8* 2.8*   Coagulation Profile: No results for input(s): INR, PROTIME in the last 168 hours. HbA1C: No results for input(s): HGBA1C in the last 72 hours. CBG: Recent Labs  Lab 02/11/20 1306 02/11/20 1544 02/11/20 2106 02/12/20 0603  02/12/20 0807  GLUCAP 187* 254* 165* 156* 148*    No results found for this or any previous visit (from the past 240 hour(s)).   Radiology Studies: No results found.  Marzetta Board, MD, PhD [facility]  Between 7 am - 7 pm I am available, please contact me via Amion or Securechat  Between 7 pm - 7 am I am not available, please contact night coverage MD/APP via Amion

## 2020-02-12 NOTE — Progress Notes (Signed)
Physical Therapy Treatment Patient Details Name: Jon Townsend MRN: [id number] DOB: 12/08/1955 Today's Date: 02/12/2020    History of Present Illness 65 y.o. male with history of hypertension, hyperlipidemia, diabetes mellitus type 2 who has had Covid infection in March 2021 was given antibodies following which symptoms resolved has been having some upper respiratory tract symptoms last month. Patient started experiencing tingling and numbness of the upper extremities on Sunday February 01, 2020 which slowly progressed proximally and also started involving the lower extremities. Admitted for polyneuropathy workup.    PT Comments    Pt tolerates treatment well despite continued reports of pain from tingling and stabbing sensation in BLEs and hands. Pt continues to demonstrates significant LE weakness and requires mod-maxA for all transfer training. Pt with some increased instability with sitting balance initially today but this improves with increased time in an upright position. Pt will benefit from aggressive mobilization and continued acute PT services to improve functional mobility quality and to reduce falls risk. PT continues to recommend CIR placement at this time.   Follow Up Recommendations  CIR     Equipment Recommendations  Rolling walker with 5" wheels;Wheelchair (measurements PT)    Recommendations for Other Services       Precautions / Restrictions Precautions Precautions: Fall Precaution Comments: contact and airborne precautions Restrictions Weight Bearing Restrictions: No    Mobility  Bed Mobility Overal bed mobility: Needs Assistance Bed Mobility: Supine to Sit     Supine to sit: Min guard;HOB elevated     General bed mobility comments: use of railing  Transfers Overall transfer level: Needs assistance Equipment used: 1 person hand held assist;2 person hand held assist Transfers: Sit to/from [facility] Sit to Stand: Max assist;Mod  assist;+2 physical assistance (maxA of 1 or modA x2, improved with use of railing or chair to push up into hip extension) Stand pivot transfers: Max assist       General transfer comment: pt continues to require knee block, initial 2 sit to stands with BUE support of PT and L knee block, progressing with addition of armchair on R side for UE support. Pt requires maxA to stand pivot with some R knee buckling. Pt then stands with improved effort and reduced assistance from recliner with modA x2 pushing from armrests.  Ambulation/Gait                 Stairs             Wheelchair Mobility    Modified Rankin (Stroke Patients Only)       Balance Overall balance assessment: Needs assistance Sitting-balance support: Single extremity supported;Bilateral upper extremity supported;Feet supported Sitting balance-Leahy Scale: Poor Sitting balance - Comments: reliant on BUE support, minA initially progressing to minG   Standing balance support: Bilateral upper extremity supported Standing balance-Leahy Scale: Poor Standing balance comment: modA with BUE support of chair back                            Cognition Arousal/Alertness: Awake/alert Behavior During Therapy: WFL for tasks assessed/performed;Anxious Overall Cognitive Status: Within Functional Limits for tasks assessed                                        Exercises      General Comments General comments (skin integrity, edema, etc.): VSS on RA  Pertinent Vitals/Pain Pain Assessment: Faces Faces Pain Scale: Hurts little more Pain Location: tingling in hands and feet Pain Descriptors / Indicators: Tingling Pain Intervention(s): Monitored during session    Home Living                      Prior Function            PT Goals (current goals can now be found in the care plan section) Acute Rehab PT Goals Patient Stated Goal: to return to independent mobility Progress  towards PT goals: Progressing toward goals    Frequency    Min 4X/week      PT Plan Current plan remains appropriate    Co-evaluation              AM-PAC PT "6 Clicks" Mobility   Outcome Measure  Help needed turning from your back to your side while in a flat bed without using bedrails?: A Little Help needed moving from lying on your back to sitting on the side of a flat bed without using bedrails?: A Little Help needed moving to and from a bed to a chair (including a wheelchair)?: A Lot Help needed standing up from a chair using your arms (e.g., wheelchair or bedside chair)?: A Lot Help needed to walk in hospital room?: Total Help needed climbing 3-5 steps with a railing? : Total 6 Click Score: 12    End of Session Equipment Utilized During Treatment: Gait belt Activity Tolerance: Patient tolerated treatment well Patient left: in chair;with call bell/phone within reach;with chair alarm set Nurse Communication: Mobility status;Need for lift equipment (STEDY) PT Visit Diagnosis: Other abnormalities of gait and mobility (R26.89);Muscle weakness (generalized) (M62.81);Other symptoms and signs involving the nervous system (R29.898);Unsteadiness on feet (R26.81);Difficulty in walking, not elsewhere classified (R26.2);Pain Pain - part of body: Leg     Time: 5916-3846 PT Time Calculation (min) (ACUTE ONLY): 30 min  Charges:  $Therapeutic Activity: 23-37 mins                     Zenaida Niece, PT, DPT Acute Rehabilitation Pager: [phone]    Zenaida Niece 02/12/2020, 4:09 PM

## 2020-02-13 DIAGNOSIS — E1169 Type 2 diabetes mellitus with other specified complication: Secondary | ICD-10-CM | POA: Diagnosis not present

## 2020-02-13 DIAGNOSIS — U071 COVID-19: Secondary | ICD-10-CM | POA: Diagnosis not present

## 2020-02-13 DIAGNOSIS — E669 Obesity, unspecified: Secondary | ICD-10-CM | POA: Diagnosis not present

## 2020-02-13 DIAGNOSIS — G629 Polyneuropathy, unspecified: Secondary | ICD-10-CM | POA: Diagnosis not present

## 2020-02-13 LAB — GLUCOSE, CAPILLARY
Glucose-Capillary: 175 mg/dL — ABNORMAL HIGH (ref 70–99)
Glucose-Capillary: 219 mg/dL — ABNORMAL HIGH (ref 70–99)
Glucose-Capillary: 192 mg/dL — ABNORMAL HIGH (ref 70–99)
Glucose-Capillary: 260 mg/dL — ABNORMAL HIGH (ref 70–99)
Glucose-Capillary: 194 mg/dL — ABNORMAL HIGH (ref 70–99)

## 2020-02-13 MED ORDER — POLYETHYLENE GLYCOL 3350 17 G PO PACK
17.0000 g | PACK | Freq: Three times a day (TID) | ORAL | Status: DC
  Administered 2020-02-13 – 2020-02-14 (×5): 17 g via ORAL
  Filled 2020-02-13 (×5): qty 1

## 2020-02-13 NOTE — Progress Notes (Signed)
Physical Therapy Treatment Patient Details Name: Jon Townsend MRN: [id number] DOB: Feb 23, 1955 Today's Date: 02/13/2020    History of Present Illness 65 y.o. male with history of hypertension, hyperlipidemia, diabetes mellitus type 2 who has had Covid infection in March 2021 was given antibodies following which symptoms resolved has been having some upper respiratory tract symptoms last month. Patient started experiencing tingling and numbness of the upper extremities on Sunday February 01, 2020 which slowly progressed proximally and also started involving the lower extremities. Admitted for polyneuropathy workup.    PT Comments    Pt tolerates treatment well with improvement in transfer quality and progression to gait training. Pt continues to demonstrates significant LE weakness but is able to utilize UEs through RW to power up into transfers and to reduced falls risk. Pt does remain limited in gait tolerance due to impaired neuromuscular endurance. Pt is at a high falls risk due to LE weakness and imbalance. Pt will benefit from continued acute PT POC to progress gait training and standing tolerance. PT continues to recommend CIR placement at this time.   Follow Up Recommendations  CIR     Equipment Recommendations  Rolling walker with 5" wheels;Wheelchair (measurements PT);3in1 (PT)    Recommendations for Other Services       Precautions / Restrictions Precautions Precautions: Fall Precaution Comments: off COVID precautions as of 02/13/20 Restrictions Weight Bearing Restrictions: No    Mobility  Bed Mobility Overal bed mobility: Needs Assistance Bed Mobility: Supine to Sit     Supine to sit: HOB elevated;Min guard     General bed mobility comments: pt with posterior LOB which he is able to correct for with UE support when initially coming to sitting  Transfers Overall transfer level: Needs assistance Equipment used: Rolling walker (2 wheeled) Transfers: Sit to/from  [facility] Sit to Stand: Mod assist;From elevated surface Stand pivot transfers: Mod assist       General transfer comment: pt with improved ability to power up into standing with UE support of RW. PT provides verbal cues for hand placement, scooting forward to edge of chair, and foot placement.  Ambulation/Gait Ambulation/Gait assistance: Mod assist Gait Distance (Feet): 8 Feet Assistive device: Rolling walker (2 wheeled) Gait Pattern/deviations: Step-to pattern Gait velocity: reduced Gait velocity interpretation: <1.31 ft/sec, indicative of household ambulator General Gait Details: pt with short step-to gait, reduced foot clearance and impaired control of foot placement at initial contact. Pt with mild intermittent knee buckling vs hyperextension   Stairs             Wheelchair Mobility    Modified Rankin (Stroke Patients Only)       Balance Overall balance assessment: Needs assistance Sitting-balance support: Single extremity supported;Bilateral upper extremity supported;Feet supported Sitting balance-Leahy Scale: Poor Sitting balance - Comments: reliant on UE support Postural control: Posterior lean Standing balance support: Bilateral upper extremity supported Standing balance-Leahy Scale: Poor Standing balance comment: minA with BUE support of RW                            Cognition Arousal/Alertness: Awake/alert Behavior During Therapy: WFL for tasks assessed/performed;Anxious Overall Cognitive Status: Within Functional Limits for tasks assessed                                        Exercises      General Comments  General comments (skin integrity, edema, etc.): VSS on RA      Pertinent Vitals/Pain Pain Assessment: Faces Faces Pain Scale: Hurts little more Pain Location: tingling hands and feet Pain Descriptors / Indicators: Tingling Pain Intervention(s): Monitored during session    Home Living                       Prior Function            PT Goals (current goals can now be found in the care plan section) Acute Rehab PT Goals Patient Stated Goal: to return to independent mobility Progress towards PT goals: Progressing toward goals    Frequency    Min 4X/week      PT Plan Current plan remains appropriate    Co-evaluation              AM-PAC PT "6 Clicks" Mobility   Outcome Measure  Help needed turning from your back to your side while in a flat bed without using bedrails?: A Little Help needed moving from lying on your back to sitting on the side of a flat bed without using bedrails?: A Little Help needed moving to and from a bed to a chair (including a wheelchair)?: A Lot Help needed standing up from a chair using your arms (e.g., wheelchair or bedside chair)?: A Lot Help needed to walk in hospital room?: A Lot Help needed climbing 3-5 steps with a railing? : Total 6 Click Score: 13    End of Session Equipment Utilized During Treatment: Gait belt Activity Tolerance: Patient tolerated treatment well Patient left: in chair;with call bell/phone within reach;with chair alarm set Nurse Communication: Mobility status PT Visit Diagnosis: Other abnormalities of gait and mobility (R26.89);Muscle weakness (generalized) (M62.81);Other symptoms and signs involving the nervous system (R29.898);Unsteadiness on feet (R26.81);Difficulty in walking, not elsewhere classified (R26.2);Pain Pain - part of body: Leg     Time: 9024-0973 PT Time Calculation (min) (ACUTE ONLY): 30 min  Charges:  $Gait Training: 8-22 mins $Therapeutic Activity: 8-22 mins                     Zenaida Niece, PT, DPT Acute Rehabilitation Pager: [phone]    Zenaida Niece 02/13/2020, 12:34 PM

## 2020-02-13 NOTE — Progress Notes (Signed)
PROGRESS NOTE  Jon Townsend PRF:[id number] DOB: 12-Feb-1955 DOA: 02/03/2020 PCP: [name], DO   LOS: 10 days   Brief Narrative / Interim history:  64 y.o.malewithhistory of hypertension, hyperlipidemia, diabetes mellitus type 2 who has had Covid infection in March 2021 was given antibodies following which symptoms resolved has been having some upper respiratory tract symptoms last month for which patient received prednisone antibiotics and also last week for which again patient received prednisone. Patient started experiencing tingling and numbness of the upper extremities on "Sunday February 01, 2020 which slowly progressed proximally and also started involving the lower extremities. At that point patient came to the ER was evaluated by neurologist and outpatient further work-up was recommended. But since patient's symptoms got more worse with weakness of the lower extremities patient decided to come to the ER. Patient also has been having low back pain denies any fever chills or any incontinence of urine or bowel.  He was diagnosed with Guillain-Barr syndrome status post IVIG  Subjective / 24h Interval events: Feeling better  Assessment & Plan: Principal Problem Guillain-Barr syndrome, polyneuropathy-neurology consulted, he is status post 5 days of IVIG.  Much improved, PT recommended CIR, placement pending, insurance authorization came through but no beds available  Active Problems Low back pain-continue oxycodone, would favor that versus IV pain medications  COVID-19 infection-asymptomatic, s/p 10 days of isolation, removed 2/4  DM2-continue sliding scale  CBG (last 3)  Recent Labs    02/12/20 2106 02/13/20 0602 02/13/20 1120  GLUCAP 189* 175* 219*   Hypertension-continue medications as below  Hyperlipidemia-continue statin  Hyponatremia-euvolemic, continue fluid restriction, stable, repeat Na in the morning  Scheduled Meds: . amLODipine  5 mg Oral  Daily  . atorvastatin  40 mg Oral QHS  . docusate sodium  100 mg Oral BID  . fexofenadine  60 mg Oral BID  . gabapentin  100 mg Oral BID  . insulin aspart  0-9 Units Subcutaneous TID WC  . latanoprost  1 drop Both Eyes QHS  . lidocaine  1 patch Transdermal Q24H  . metoprolol tartrate  50 mg Oral BID  . oxymetazoline  1 spray Each Nare BID  . polyethylene glycol  17 g Oral TID  . senna-docusate  2 tablet Oral BID  . tamsulosin  0.4 mg Oral Daily   Continuous Infusions: PRN Meds:.acetaminophen **OR** acetaminophen, HYDROmorphone (DILAUDID) injection, melatonin, oxyCODONE  Diet Orders (From admission, onward)    Start     Ordered   02/10/20 1243  Diet heart healthy/carb modified Room service appropriate? Yes; Fluid consistency: Thin; Fluid restriction: 1500 mL Fluid  Diet effective now       Question Answer Comment  Diet-HS Snack? Nothing   Room service appropriate? Yes   Fluid consistency: Thin   Fluid restriction: 1500 mL Fluid      02" /01/22 1242          DVT prophylaxis: SCDs Start: 02/03/20 2129     Code Status: Full Code  Family Communication: no faimly at bedside   Status is: Inpatient  Remains inpatient appropriate because:Inpatient level of care appropriate due to severity of illness   Dispo: The patient is from: Home              Anticipated d/c is to: CIR              Anticipated d/c date is: 2 days              Patient currently is medically stable to  d/c.   Difficult to place patient No  Level of care: Telemetry Medical  Consultants:  eurology   Procedures:  None   Microbiology  None   Antimicrobials: None     Objective: Vitals:   02/12/20 2330 02/13/20 0342 02/13/20 0816 02/13/20 1120  BP: 120/82 (!) 150/76 (!) 147/87 (!) 166/86  Pulse: 87 79 89 93  Resp: 18 18 18 20   Temp: 97.8 F (36.6 C) 97.9 F (36.6 C) 98.1 F (36.7 C) 97.9 F (36.6 C)  TempSrc: Oral Oral Oral Oral  SpO2: 95% 94% 93% 98%  Weight:      Height:         Intake/Output Summary (Last 24 hours) at 02/13/2020 1536 Last data filed at 02/13/2020 3570 Gross per 24 hour  Intake --  Output 1200 ml  Net -1200 ml   Filed Weights   02/03/20 1228  Weight: 113.4 kg    Examination:  Constitutional: NAD Eyes: no icterus  ENMT: mmm Neck: normal, supple Respiratory: CTA biL, no wheezing, no crackles Cardiovascular: RRR, no MRG, no edema  Abdomen: soft, nt, nd, bs+ Musculoskeletal: no clubbing / cyanosis.  Skin: no rashes Neurologic: non focal    Data Reviewed: I have independently reviewed following labs and imaging studies   CBC: Recent Labs  Lab 02/07/20 0214 02/08/20 0038 02/12/20 0333  WBC 7.3 8.5 7.3  NEUTROABS 5.4 6.3  --   HGB 15.2 15.8 15.1  HCT 43.1 46.3 44.3  MCV 88.9 89.7 89.1  PLT 200 211 177   Basic Metabolic Panel: Recent Labs  Lab 02/08/20 0038 02/09/20 0652 02/10/20 0339 02/11/20 0405 02/12/20 0333  NA 127* 128* 127* 129* 128*  K 4.0 3.9 4.1 4.2 4.5  CL 93* 95* 92* 96* 97*  CO2 25 25 27 26 23   GLUCOSE 184* 205* 213* 178* 178*  BUN 19 26* 25* 27* 25*  CREATININE 0.75 0.76 0.78 0.81 0.81  CALCIUM 8.6* 8.6* 8.7* 8.7* 8.8*   Liver Function Tests: Recent Labs  Lab 02/07/20 0214 02/08/20 0038 02/09/20 0652 02/10/20 0339 02/11/20 0405  AST 23 25 29  32 32  ALT 22 23 30  34 35  ALKPHOS 57 61 63 56 60  BILITOT 1.3* 1.2 1.4* 1.4* 1.2  PROT 8.5* 8.7* 9.0* 8.4* 8.0  ALBUMIN 2.8* 2.8* 2.9* 2.8* 2.8*   Coagulation Profile: No results for input(s): INR, PROTIME in the last 168 hours. HbA1C: No results for input(s): HGBA1C in the last 72 hours. CBG: Recent Labs  Lab 02/12/20 1221 02/12/20 1511 02/12/20 2106 02/13/20 0602 02/13/20 1120  GLUCAP 242* 160* 189* 175* 219*    No results found for this or any previous visit (from the past 240 hour(s)).   Radiology Studies: No results found.  Marzetta Board, MD, PhD [facility]  Between 7 am - 7 pm I am available, please contact me via  Amion or Securechat  Between 7 pm - 7 am I am not available, please contact night coverage MD/APP via Amion

## 2020-02-13 NOTE — Progress Notes (Signed)
Inpatient Rehab Admissions Coordinator:  Received insurance authorization approval for CIR today.  However, there are no beds available to admit to CIR today.  Pt made aware.  Will continue to follow.   Gayland Curry, [place], [place] Admissions Coordinator [phone]

## 2020-02-13 NOTE — Plan of Care (Signed)

## 2020-02-14 ENCOUNTER — Inpatient Hospital Stay (HOSPITAL_COMMUNITY): Payer: Medicare Other

## 2020-02-14 DIAGNOSIS — G629 Polyneuropathy, unspecified: Secondary | ICD-10-CM | POA: Diagnosis not present

## 2020-02-14 DIAGNOSIS — E1169 Type 2 diabetes mellitus with other specified complication: Secondary | ICD-10-CM | POA: Diagnosis not present

## 2020-02-14 DIAGNOSIS — U071 COVID-19: Secondary | ICD-10-CM | POA: Diagnosis not present

## 2020-02-14 DIAGNOSIS — E669 Obesity, unspecified: Secondary | ICD-10-CM | POA: Diagnosis not present

## 2020-02-14 LAB — CBC
HCT: 40.7 % (ref 39.0–52.0)
Hemoglobin: 14.5 g/dL (ref 13.0–17.0)
MCH: 31.5 pg (ref 26.0–34.0)
MCHC: 35.6 g/dL (ref 30.0–36.0)
MCV: 88.3 fL (ref 80.0–100.0)
Platelets: UNDETERMINED 10*3/uL (ref 150–400)
RBC: 4.61 MIL/uL (ref 4.22–5.81)
RDW: 12.1 % (ref 11.5–15.5)
WBC: 7.6 10*3/uL (ref 4.0–10.5)
nRBC: 0 % (ref 0.0–0.2)

## 2020-02-14 LAB — GLUCOSE, CAPILLARY
Glucose-Capillary: 180 mg/dL — ABNORMAL HIGH (ref 70–99)
Glucose-Capillary: 260 mg/dL — ABNORMAL HIGH (ref 70–99)
Glucose-Capillary: 195 mg/dL — ABNORMAL HIGH (ref 70–99)
Glucose-Capillary: 214 mg/dL — ABNORMAL HIGH (ref 70–99)

## 2020-02-14 LAB — BASIC METABOLIC PANEL
Anion gap: 7 (ref 5–15)
BUN: 21 mg/dL (ref 8–23)
CO2: 26 mmol/L (ref 22–32)
Calcium: 8.5 mg/dL — ABNORMAL LOW (ref 8.9–10.3)
Chloride: 94 mmol/L — ABNORMAL LOW (ref 98–111)
Creatinine, Ser: 0.83 mg/dL (ref 0.61–1.24)
GFR, Estimated: >60 mL/min (ref >60)
Glucose, Bld: 245 mg/dL — ABNORMAL HIGH (ref 70–99)
Potassium: 4.3 mmol/L (ref 3.5–5.1)
Sodium: 127 mmol/L — ABNORMAL LOW (ref 135–145)

## 2020-02-14 LAB — OCCULT BLOOD X 1 CARD TO LAB, STOOL: Fecal Occult Bld: POSITIVE — AB

## 2020-02-14 MED ORDER — SORBITOL 70 % SOLN
960.0000 mL | TOPICAL_OIL | Freq: Once | ORAL | Status: AC
  Administered 2020-02-14: 960 mL via RECTAL
  Filled 2020-02-14: qty 473

## 2020-02-14 MED ORDER — IOHEXOL 9 MG/ML PO SOLN
ORAL | Status: AC
  Administered 2020-02-14: 1000 mL
  Filled 2020-02-14: qty 500

## 2020-02-14 MED ORDER — IOHEXOL 300 MG/ML  SOLN
100.0000 mL | Freq: Once | INTRAMUSCULAR | Status: AC | PRN
  Administered 2020-02-14: 100 mL via INTRAVENOUS

## 2020-02-14 MED ORDER — SALINE SPRAY 0.65 % NA SOLN
1.0000 | NASAL | Status: DC | PRN
  Administered 2020-02-14: 1 via NASAL
  Filled 2020-02-14: qty 44

## 2020-02-14 NOTE — Progress Notes (Signed)
PROGRESS NOTE  Jon Townsend TZG:[id number] DOB: 19-Dec-1955 DOA: 02/03/2020 PCP: [name], DO   LOS: 11 days   Brief Narrative / Interim history:  64 y.o.malewithhistory of hypertension, hyperlipidemia, diabetes mellitus type 2 who has had Covid infection in March 2021 was given antibodies following which symptoms resolved has been having some upper respiratory tract symptoms last month for which patient received prednisone antibiotics and also last week for which again patient received prednisone. Patient started experiencing tingling and numbness of the upper extremities on "Sunday February 01, 2020 which slowly progressed proximally and also started involving the lower extremities. At that point patient came to the ER was evaluated by neurologist and outpatient further work-up was recommended. But since patient's symptoms got more worse with weakness of the lower extremities patient decided to come to the ER. Patient also has been having low back pain denies any fever chills or any incontinence of urine or bowel.  He was diagnosed with Guillain-Barr syndrome status post IVIG  Subjective / 24h Interval events: Feeling constipated but otherwise no significant complaints  Assessment & Plan: Principal Problem Guillain-Barr syndrome, polyneuropathy-neurology consulted, he is status post 5 days of IVIG.  Much improved, PT recommended CIR, placement pending, insurance authorization came through but no beds available  Active Problems Low back pain-continue oxycodone, would favor that versus IV pain medications  Constipation-likely narcotic induced, will try smog enema.  If it fails try Relistor and ultimately a bowel prep  COVID-19 infection-asymptomatic, s/p 10 days of isolation, removed 2/4  DM2-continue sliding scale  CBG (last 3)  Recent Labs    02/13/20 1803 02/13/20 2102 02/14/20 0643  GLUCAP 260* 194* 180*   Hypertension-continue medications as  below  Hyperlipidemia-continue statin  Hyponatremia-euvolemic, continue fluid restriction, 127 this morning, monitor  Scheduled Meds: . amLODipine  5 mg Oral Daily  . atorvastatin  40 mg Oral QHS  . docusate sodium  100 mg Oral BID  . fexofenadine  60 mg Oral BID  . gabapentin  100 mg Oral BID  . insulin aspart  0-9 Units Subcutaneous TID WC  . latanoprost  1 drop Both Eyes QHS  . lidocaine  1 patch Transdermal Q24H  . metoprolol tartrate  50 mg Oral BID  . oxymetazoline  1 spray Each Nare BID  . polyethylene glycol  17 g Oral TID  . senna-docusate  2 tablet Oral BID  . sorbitol, milk of mag, mineral oil, glycerin (SMOG) enema  960 mL Rectal Once  . tamsulosin  0.4 mg Oral Daily   Continuous Infusions: PRN Meds:.acetaminophen **OR** acetaminophen, HYDROmorphone (DILAUDID) injection, melatonin, oxyCODONE  Diet Orders (From admission, onward)    Start     Ordered   02/10/20 1243  Diet heart healthy/carb modified Room service appropriate? Yes; Fluid consistency: Thin; Fluid restriction: 1500 mL Fluid  Diet effective now       Question Answer Comment  Diet-HS Snack? Nothing   Room service appropriate? Yes   Fluid consistency: Thin   Fluid restriction: 1500 mL Fluid      02" /01/22 1242          DVT prophylaxis: SCDs Start: 02/03/20 2129     Code Status: Full Code  Family Communication: no faimly at bedside   Status is: Inpatient  Remains inpatient appropriate because:Inpatient level of care appropriate due to severity of illness   Dispo: The patient is from: Home              Anticipated d/c is to: CIR  Anticipated d/c date is: 2 days              Patient currently is medically stable to d/c.   Difficult to place patient No  Level of care: Telemetry Medical  Consultants:  eurology   Procedures:  None   Microbiology  None   Antimicrobials: None     Objective: Vitals:   02/13/20 2100 02/14/20 0053 02/14/20 0432 02/14/20 0749  BP: (!)  146/79 (!) 151/72 (!) 154/82 (!) 149/92  Pulse: 87 92 81 85  Resp: 18 18 18 18   Temp: 97.7 F (36.5 C) 98.2 F (36.8 C) 98.1 F (36.7 C) 97.9 F (36.6 C)  TempSrc: Oral Oral Oral Oral  SpO2: 96% 94% 94% 97%  Weight:      Height:        Intake/Output Summary (Last 24 hours) at 02/14/2020 1100 Last data filed at 02/14/2020 9702 Gross per 24 hour  Intake --  Output 300 ml  Net -300 ml   Filed Weights   02/03/20 1228  Weight: 113.4 kg    Examination:  Constitutional: NAD Eyes: no icterus  ENMT: mmm Neck: normal, supple Respiratory: CTA bilaterally without wheezing or crackles Cardiovascular: Regular rate and rhythm, no murmurs, no edema Abdomen: Soft, nontender, mildly distended, bowel sounds positive Musculoskeletal: no clubbing / cyanosis.  Skin: No rash seen Neurologic: No focal deficits   Data Reviewed: I have independently reviewed following labs and imaging studies   CBC: Recent Labs  Lab 02/08/20 0038 02/12/20 0333 02/14/20 0215  WBC 8.5 7.3 7.6  NEUTROABS 6.3  --   --   HGB 15.8 15.1 14.5  HCT 46.3 44.3 40.7  MCV 89.7 89.1 88.3  PLT 211 150 PLATELET CLUMPS NOTED ON SMEAR, UNABLE TO ESTIMATE   Basic Metabolic Panel: Recent Labs  Lab 02/09/20 0652 02/10/20 0339 02/11/20 0405 02/12/20 0333 02/14/20 0215  NA 128* 127* 129* 128* 127*  K 3.9 4.1 4.2 4.5 4.3  CL 95* 92* 96* 97* 94*  CO2 25 27 26 23 26   GLUCOSE 205* 213* 178* 178* 245*  BUN 26* 25* 27* 25* 21  CREATININE 0.76 0.78 0.81 0.81 0.83  CALCIUM 8.6* 8.7* 8.7* 8.8* 8.5*   Liver Function Tests: Recent Labs  Lab 02/08/20 0038 02/09/20 0652 02/10/20 0339 02/11/20 0405  AST 25 29 32 32  ALT 23 30 34 35  ALKPHOS 61 63 56 60  BILITOT 1.2 1.4* 1.4* 1.2  PROT 8.7* 9.0* 8.4* 8.0  ALBUMIN 2.8* 2.9* 2.8* 2.8*   Coagulation Profile: No results for input(s): INR, PROTIME in the last 168 hours. HbA1C: No results for input(s): HGBA1C in the last 72 hours. CBG: Recent Labs  Lab  02/13/20 1120 02/13/20 1605 02/13/20 1803 02/13/20 2102 02/14/20 0643  GLUCAP 219* 192* 260* 194* 180*    No results found for this or any previous visit (from the past 240 hour(s)).   Radiology Studies: No results found.  Marzetta Board, MD, PhD [facility]  Between 7 am - 7 pm I am available, please contact me via Amion or Securechat  Between 7 pm - 7 am I am not available, please contact night coverage MD/APP via Amion

## 2020-02-14 NOTE — Plan of Care (Signed)

## 2020-02-14 NOTE — Progress Notes (Signed)
Pt stool brown-red, pt stated "severe" abdominal pain, MD notified, stool sample and occult card collected. Will continue to monitor.

## 2020-02-15 DIAGNOSIS — K922 Gastrointestinal hemorrhage, unspecified: Secondary | ICD-10-CM | POA: Diagnosis not present

## 2020-02-15 DIAGNOSIS — K529 Noninfective gastroenteritis and colitis, unspecified: Secondary | ICD-10-CM | POA: Diagnosis not present

## 2020-02-15 DIAGNOSIS — G629 Polyneuropathy, unspecified: Secondary | ICD-10-CM | POA: Diagnosis not present

## 2020-02-15 DIAGNOSIS — U071 COVID-19: Secondary | ICD-10-CM | POA: Diagnosis not present

## 2020-02-15 LAB — COMPREHENSIVE METABOLIC PANEL
ALT: 38 U/L (ref 0–44)
AST: 43 U/L — ABNORMAL HIGH (ref 15–41)
Albumin: 2.5 g/dL — ABNORMAL LOW (ref 3.5–5.0)
Alkaline Phosphatase: 72 U/L (ref 38–126)
Anion gap: 11 (ref 5–15)
BUN: 23 mg/dL (ref 8–23)
CO2: 20 mmol/L — ABNORMAL LOW (ref 22–32)
Calcium: 8.5 mg/dL — ABNORMAL LOW (ref 8.9–10.3)
Chloride: 93 mmol/L — ABNORMAL LOW (ref 98–111)
Creatinine, Ser: 0.76 mg/dL (ref 0.61–1.24)
GFR, Estimated: >60 mL/min (ref >60)
Glucose, Bld: 226 mg/dL — ABNORMAL HIGH (ref 70–99)
Potassium: 5 mmol/L (ref 3.5–5.1)
Sodium: 124 mmol/L — ABNORMAL LOW (ref 135–145)
Total Bilirubin: 1.9 mg/dL — ABNORMAL HIGH (ref 0.3–1.2)
Total Protein: 7.1 g/dL (ref 6.5–8.1)

## 2020-02-15 LAB — CBC
HCT: 46.3 % (ref 39.0–52.0)
Hemoglobin: 16.1 g/dL (ref 13.0–17.0)
MCH: 30.6 pg (ref 26.0–34.0)
MCHC: 34.8 g/dL (ref 30.0–36.0)
MCV: 88 fL (ref 80.0–100.0)
Platelets: 112 10*3/uL — ABNORMAL LOW (ref 150–400)
RBC: 5.26 MIL/uL (ref 4.22–5.81)
RDW: 12.1 % (ref 11.5–15.5)
WBC: 15.6 10*3/uL — ABNORMAL HIGH (ref 4.0–10.5)
nRBC: 0 % (ref 0.0–0.2)

## 2020-02-15 LAB — URINALYSIS, ROUTINE W REFLEX MICROSCOPIC
Bacteria, UA: NONE SEEN
Bilirubin Urine: NEGATIVE
Glucose, UA: >=500 mg/dL — AB
Ketones, ur: NEGATIVE mg/dL
Leukocytes,Ua: NEGATIVE
Nitrite: NEGATIVE
Protein, ur: 30 mg/dL — AB
RBC / HPF: >50 RBC/hpf — ABNORMAL HIGH (ref 0–5)
Specific Gravity, Urine: 1.032 — ABNORMAL HIGH (ref 1.005–1.030)
pH: 5 (ref 5.0–8.0)

## 2020-02-15 LAB — GLUCOSE, CAPILLARY
Glucose-Capillary: 252 mg/dL — ABNORMAL HIGH (ref 70–99)
Glucose-Capillary: 242 mg/dL — ABNORMAL HIGH (ref 70–99)
Glucose-Capillary: 210 mg/dL — ABNORMAL HIGH (ref 70–99)
Glucose-Capillary: 176 mg/dL — ABNORMAL HIGH (ref 70–99)

## 2020-02-15 MED ORDER — ONDANSETRON 4 MG PO TBDP
4.0000 mg | ORAL_TABLET | Freq: Three times a day (TID) | ORAL | Status: DC | PRN
  Administered 2020-02-15 – 2020-02-18 (×4): 4 mg via ORAL
  Filled 2020-02-15 (×5): qty 1

## 2020-02-15 MED ORDER — SODIUM CHLORIDE 0.9 % IV SOLN: INTRAVENOUS | Status: AC

## 2020-02-15 MED ORDER — CHLORHEXIDINE GLUCONATE CLOTH 2 % EX PADS
6.0000 | MEDICATED_PAD | Freq: Every day | CUTANEOUS | Status: DC
  Administered 2020-02-15 – 2020-02-19 (×5): 6 via TOPICAL

## 2020-02-15 MED ORDER — ONDANSETRON HCL 4 MG/2ML IJ SOLN
4.0000 mg | Freq: Four times a day (QID) | INTRAMUSCULAR | Status: DC | PRN
  Administered 2020-02-15: 4 mg via INTRAVENOUS
  Filled 2020-02-15: qty 2

## 2020-02-15 NOTE — Progress Notes (Addendum)
PROGRESS NOTE  Jon Townsend PIR:[id number] DOB: August 10, 1955 DOA: 02/03/2020 PCP: [name], DO   LOS: 12 days   Brief Narrative / Interim history:  65 y.o.malewithhistory of hypertension, hyperlipidemia, diabetes mellitus type 2 who has had Covid infection in March 2021 was given antibodies following which symptoms resolved has been having some upper respiratory tract symptoms last month for which patient received prednisone antibiotics and also last week for which again patient received prednisone. Patient started experiencing tingling and numbness of the upper extremities on Sunday February 01, 2020 which slowly progressed proximally and also started involving the lower extremities. At that point patient came to the ER was evaluated by neurologist and outpatient further work-up was recommended. But since patient's symptoms got more worse with weakness of the lower extremities patient decided to come to the ER. Patient also has been having low back pain denies any fever chills or any incontinence of urine or bowel.  He was diagnosed with Guillain-Barr syndrome status post IVIG  Subjective / 24h Interval events: Has had several diarrheal episodes since yesterday afternoon, associated with significant cramping.  He also saw intermittent blood in the stools  Assessment & Plan: Principal Problem Guillain-Barr syndrome, polyneuropathy-neurology consulted, he is status post 5 days of IVIG.  Much improved, PT recommended CIR, placement pending, insurance authorization came through but no beds available  Active Problems Severe constipation, abdominal pain, blood in the stool -This is probably triggered by severe constipation in the setting of prolonged narcotic use.  Patient was placed on aggressive bowel regimen with excellent results yesterday however started having abdominal cramping and traces of blood in the stool -Abdominal x-ray last night was concern for ileus.  CT scan  of the abdomen and pelvis concerning for colitis.  CBC this morning shows normal hemoglobin, likely due to bleeding in the setting of prolonged constipation and probable colitis due to that.  Symptomatic management  Acute urinary retention -Possibly due to increased intra-abdominal pressure in the setting of severe constipation, required in and out caths x2, will continue to monitor and if continues to require in and out cath will place a Foley.  Leukocytosis -Check urinalysis given distention to rule out UTI, possibly due to #2  Low back pain-continue oxycodone, would favor that versus IV pain medications  COVID-19 infection-asymptomatic, s/p 10 days of isolation, removed 2/4  DM2-continue sliding scale  CBG (last 3)  Recent Labs    02/14/20 1614 02/14/20 2108 02/15/20 0640  GLUCAP 195* 214* 252*   Hypertension-continue medications as below  Hyperlipidemia-continue statin  Hyponatremia-euvolemic, more so today, possibly dehydrated now, will give gentle hydration  Scheduled Meds: . amLODipine  5 mg Oral Daily  . atorvastatin  40 mg Oral QHS  . docusate sodium  100 mg Oral BID  . fexofenadine  60 mg Oral BID  . gabapentin  100 mg Oral BID  . insulin aspart  0-9 Units Subcutaneous TID WC  . latanoprost  1 drop Both Eyes QHS  . lidocaine  1 patch Transdermal Q24H  . metoprolol tartrate  50 mg Oral BID  . tamsulosin  0.4 mg Oral Daily   Continuous Infusions: . sodium chloride     PRN Meds:.acetaminophen **OR** acetaminophen, HYDROmorphone (DILAUDID) injection, melatonin, ondansetron, oxyCODONE, sodium chloride  Diet Orders (From admission, onward)    Start     Ordered   02/10/20 1243  Diet heart healthy/carb modified Room service appropriate? Yes; Fluid consistency: Thin; Fluid restriction: 1500 mL Fluid  Diet effective now  Question Answer Comment  Diet-HS Snack? Nothing   Room service appropriate? Yes   Fluid consistency: Thin   Fluid restriction: 1500 mL Fluid       02/10/20 1242          DVT prophylaxis: SCDs Start: 02/03/20 2129     Code Status: Full Code  Family Communication: no faimly at bedside   Status is: Inpatient  Remains inpatient appropriate because:Inpatient level of care appropriate due to severity of illness   Dispo: The patient is from: Home              Anticipated d/c is to: CIR              Anticipated d/c date is: 2 days              Patient currently is medically stable to d/c.   Difficult to place patient No  Level of care: Telemetry Medical  Consultants:  Neurology   Procedures:  None   Microbiology  None   Antimicrobials: None     Objective: Vitals:   02/14/20 1938 02/14/20 2344 02/15/20 0338 02/15/20 0749  BP: (!) 151/77 (!) 159/82 (!) 144/83 (!) 154/85  Pulse: 96 (!) 105 92 89  Resp: 19 19 18 18   Temp: 97.9 F (36.6 C) 98.1 F (36.7 C) 97.8 F (36.6 C) 97.8 F (36.6 C)  TempSrc: Oral Oral Oral Oral  SpO2: 90% 96% 96% 97%  Weight:      Height:        Intake/Output Summary (Last 24 hours) at 02/15/2020 1102 Last data filed at 02/15/2020 [id number] Gross per 24 hour  Intake -  Output 1200 ml  Net -1200 ml   Filed Weights   02/03/20 1228  Weight: 113.4 kg    Examination:  Constitutional: No distress Eyes: No icterus ENMT: Moist mucous membranes Neck: normal, supple Respiratory: Lungs are clear bilaterally no wheezing or crackles Cardiovascular: Regular rate and rhythm, no murmurs, no edema Abdomen: Soft, mild discomfort on palpation but no guarding or rebound Musculoskeletal: no clubbing / cyanosis.  Skin: No rash seen Neurologic: Nonfocal   Data Reviewed: I have independently reviewed following labs and imaging studies   CBC: Recent Labs  Lab 02/12/20 0333 02/14/20 0215 02/15/20 0725  WBC 7.3 7.6 15.6*  HGB 15.1 14.5 16.1  HCT 44.3 40.7 46.3  MCV 89.1 88.3 88.0  PLT 150 PLATELET CLUMPS NOTED ON SMEAR, UNABLE TO ESTIMATE [id number]*   Basic Metabolic Panel: Recent Labs   Lab 02/10/20 0339 02/11/20 0405 02/12/20 0333 02/14/20 0215 02/15/20 0725  NA 127* 129* 128* 127* 124*  K 4.1 4.2 4.5 4.3 5.0  CL 92* 96* 97* 94* 93*  CO2 27 26 23 26  20*  GLUCOSE 213* 178* 178* 245* 226*  BUN 25* 27* 25* 21 23  CREATININE 0.78 0.81 0.81 0.83 0.76  CALCIUM 8.7* 8.7* 8.8* 8.5* 8.5*   Liver Function Tests: Recent Labs  Lab 02/09/20 0652 02/10/20 0339 02/11/20 0405 02/15/20 0725  AST 29 32 32 43*  ALT 30 34 35 38  ALKPHOS 63 56 60 72  BILITOT 1.4* 1.4* 1.2 1.9*  PROT 9.0* 8.4* 8.0 7.1  ALBUMIN 2.9* 2.8* 2.8* 2.5*   Coagulation Profile: No results for input(s): INR, PROTIME in the last 168 hours. HbA1C: No results for input(s): HGBA1C in the last 72 hours. CBG: Recent Labs  Lab 02/14/20 0643 02/14/20 1126 02/14/20 1614 02/14/20 2108 02/15/20 0640  GLUCAP 180* 260* 195* 214* 252*  No results found for this or any previous visit (from the past 240 hour(s)).   Radiology Studies: CT ABDOMEN PELVIS W CONTRAST  Result Date: 02/14/2020 CLINICAL DATA:  Acute abdominal pain, nonlocalized EXAM: CT ABDOMEN AND PELVIS WITH CONTRAST TECHNIQUE: Multidetector CT imaging of the abdomen and pelvis was performed using the standard protocol following bolus administration of intravenous contrast. CONTRAST:  136mL OMNIPAQUE IOHEXOL 300 MG/ML  SOLN COMPARISON:  Same day abdominal radiograph. FINDINGS: Lower chest: Bibasilar atelectasis. Normal size heart. No pericardial effusion. Coronary artery calcifications. Hepatobiliary: Diffuse hepatic steatosis. A few hepatic hypodensities the largest of which measures 1.5 cm in segment 4 B which are technically indeterminate but favored represent hepatic cyst. Gallbladder is unremarkable. No biliary ductal dilatation. Pancreas: Atrophy of the tail and body of the pancreas. No surrounding inflammatory changes. No pancreatic ductal dilatation. Spleen: Normal in size without focal abnormality. Adrenals/Urinary Tract: Unremarkable  adrenal glands. No hydronephrosis. No nephrolithiasis. No suspicious enhancing lesions. No filling defect in the collecting systems or proximal ureters on delayed imaging. Bladder is decompressed otherwise unremarkable. Stomach/Bowel: Stomach is unremarkable. Normal positioning of the duodenum/ligament of Treitz. No suspicious small bowel wall thickening or 2 dilation. Small volume formed stool throughout the colon. There is some loss of distal colonic architecture and subtle wall thickening which may be chronic or secondary to colitis. Vascular/Lymphatic: Aortic atherosclerosis. No enlarged abdominal or pelvic lymph nodes. Reproductive: Prostate is unremarkable. Other: No abdominopelvic ascites. Musculoskeletal: Diffuse idiopathic skeletal hyperostosis. Multilevel degenerative change of the lumbar spine including multiple Schmorl's nodes. No acute osseous abnormality. IMPRESSION: 1. Subtle distal colonic wall thickening with some loss of architecture which may be chronic or secondary to colitis. 2. Small volume formed stool throughout the colon. 3. Diffuse hepatic steatosis. 4. Aortic atherosclerosis. Aortic Atherosclerosis (ICD10-I70.0). Electronically Signed   By: Dahlia Bailiff MD   On: 02/14/2020 23:08   DG Abd 2 Views  Result Date: 02/14/2020 CLINICAL DATA:  Pain EXAM: ABDOMEN - 2 VIEW COMPARISON:  July 03, 2020 FINDINGS: Gaseous distention of bowel without dilation. Air is visualized in the rectum. No free air. Mild degenerative changes of the lower lumbar spine. Visualized lung bases are unremarkable. IMPRESSION: Gaseous distention of bowel without dilation. Findings could reflect ileus in the appropriate clinical setting. Electronically Signed   By: Valentino Saxon MD   On: 02/14/2020 20:12   Time spent: 35 minutes  Marzetta Board, MD, PhD [facility]  Between 7 am - 7 pm I am available, please contact me via Amion or Securechat  Between 7 pm - 7 am I am not available, please contact  night coverage MD/APP via Amion

## 2020-02-16 DIAGNOSIS — G629 Polyneuropathy, unspecified: Secondary | ICD-10-CM | POA: Diagnosis not present

## 2020-02-16 DIAGNOSIS — E1169 Type 2 diabetes mellitus with other specified complication: Secondary | ICD-10-CM | POA: Diagnosis not present

## 2020-02-16 DIAGNOSIS — U071 COVID-19: Secondary | ICD-10-CM | POA: Diagnosis not present

## 2020-02-16 DIAGNOSIS — E669 Obesity, unspecified: Secondary | ICD-10-CM | POA: Diagnosis not present

## 2020-02-16 LAB — CBC
HCT: 43.3 % (ref 39.0–52.0)
Hemoglobin: 15.2 g/dL (ref 13.0–17.0)
MCH: 31.1 pg (ref 26.0–34.0)
MCHC: 35.1 g/dL (ref 30.0–36.0)
MCV: 88.7 fL (ref 80.0–100.0)
Platelets: 100 10*3/uL — ABNORMAL LOW (ref 150–400)
RBC: 4.88 MIL/uL (ref 4.22–5.81)
RDW: 12.5 % (ref 11.5–15.5)
WBC: 16.9 10*3/uL — ABNORMAL HIGH (ref 4.0–10.5)
nRBC: 0 % (ref 0.0–0.2)

## 2020-02-16 LAB — COMPREHENSIVE METABOLIC PANEL
ALT: 37 U/L (ref 0–44)
AST: 28 U/L (ref 15–41)
Albumin: 2.3 g/dL — ABNORMAL LOW (ref 3.5–5.0)
Alkaline Phosphatase: 72 U/L (ref 38–126)
Anion gap: 10 (ref 5–15)
BUN: 25 mg/dL — ABNORMAL HIGH (ref 8–23)
CO2: 25 mmol/L (ref 22–32)
Calcium: 8.4 mg/dL — ABNORMAL LOW (ref 8.9–10.3)
Chloride: 92 mmol/L — ABNORMAL LOW (ref 98–111)
Creatinine, Ser: 0.96 mg/dL (ref 0.61–1.24)
GFR, Estimated: >60 mL/min (ref >60)
Glucose, Bld: 177 mg/dL — ABNORMAL HIGH (ref 70–99)
Potassium: 4.5 mmol/L (ref 3.5–5.1)
Sodium: 127 mmol/L — ABNORMAL LOW (ref 135–145)
Total Bilirubin: 1.3 mg/dL — ABNORMAL HIGH (ref 0.3–1.2)
Total Protein: 6.5 g/dL (ref 6.5–8.1)

## 2020-02-16 LAB — GLUCOSE, CAPILLARY
Glucose-Capillary: 208 mg/dL — ABNORMAL HIGH (ref 70–99)
Glucose-Capillary: 236 mg/dL — ABNORMAL HIGH (ref 70–99)
Glucose-Capillary: 200 mg/dL — ABNORMAL HIGH (ref 70–99)
Glucose-Capillary: 149 mg/dL — ABNORMAL HIGH (ref 70–99)

## 2020-02-16 MED ORDER — LACTATED RINGERS IV SOLN: INTRAVENOUS | Status: AC

## 2020-02-16 MED ORDER — POLYMYXIN B-TRIMETHOPRIM 10000-0.1 UNIT/ML-% OP SOLN
1.0000 [drp] | OPHTHALMIC | Status: DC
  Administered 2020-02-16 – 2020-02-19 (×16): 1 [drp] via OPHTHALMIC
  Filled 2020-02-16 (×2): qty 10

## 2020-02-16 MED ORDER — SIMETHICONE 80 MG PO CHEW
40.0000 mg | CHEWABLE_TABLET | Freq: Four times a day (QID) | ORAL | Status: DC | PRN
  Administered 2020-02-16 – 2020-02-18 (×5): 40 mg via ORAL
  Filled 2020-02-16 (×7): qty 1

## 2020-02-16 NOTE — Plan of Care (Signed)

## 2020-02-16 NOTE — Progress Notes (Signed)
Physical Therapy Treatment Patient Details Name: Jon Townsend MRN: [id number] DOB: 1955-10-02 Today's Date: 02/16/2020    History of Present Illness 65 y.o. male with history of HTN, HLD, DM who has had Covid infection in March 2021 was given antibodies following which symptoms resolved has been having some upper respiratory tract symptoms last month. Patient started experiencing tingling and numbness of the upper extremities on 02/01/20 which slowly progressed proximally and also started involving the lower extremities. Admitted for polyneuropathy workup. Found to have Guillain-Barre.    PT Comments    Patient reports not feeling well this morning; states he has been in bed for 3 days and feels like he has gone backwards with progress. Also reports non stop stooling/diarrhea the last few days. Requires Mod A-Max A of 2 for standing from elevated bed height with difficulty in mid range due to weakness. Able to perform marching in place but fatigues quickly with uncontrolled descent onto bed. Declines transfer to chair. Noted to have HR at 127 bpm max with activity, increased RR and 2/4 DOE. Continue to recommend CIR. Will follow.     Follow Up Recommendations  CIR     Equipment Recommendations  Rolling walker with 5" wheels;Wheelchair (measurements PT);3in1 (PT)    Recommendations for Other Services       Precautions / Restrictions Precautions Precautions: Fall Precaution Comments: off COVID precautions as of 02/13/20 Restrictions Weight Bearing Restrictions: No    Mobility  Bed Mobility Overal bed mobility: Needs Assistance Bed Mobility: Supine to Sit;Sit to Supine     Supine to sit: HOB elevated;Min guard Sit to supine: Min guard;HOB elevated   General bed mobility comments: Heavy use of rail to get to EOB with increased time. "I was able to get up easier last week."  Transfers Overall transfer level: Needs assistance Equipment used: Rolling walker (2 wheeled) Transfers:  Sit to/from Stand Sit to Stand: Mod assist;Max assist;+2 physical assistance;From elevated surface         General transfer comment: Assist of 2 to stand from elevated bed height with cues for hand placement and use of momentum; difficulty in mid range due to BLE/quad weakness. Incontinent of stool upon standing second time. Declines tx to chair today.  Ambulation/Gait Ambulation/Gait assistance: Min assist   Assistive device: Rolling walker (2 wheeled)       General Gait Details: Performed marching in place ~15 seconds x2, fatigues. no knee buckling. HR up to 127 bpm, increased RR and 2/4 DOE.   Stairs             Wheelchair Mobility    Modified Rankin (Stroke Patients Only)       Balance Overall balance assessment: Needs assistance Sitting-balance support: Feet supported;No upper extremity supported Sitting balance-Leahy Scale: Fair Sitting balance - Comments: supervision for safety. Posterior lean during there ex LEs. Postural control: Posterior lean Standing balance support: During functional activity Standing balance-Leahy Scale: Poor Standing balance comment: minA with BUE support of RW. Performed marching in standing ~15 seconds x2.                            Cognition Arousal/Alertness: Awake/alert Behavior During Therapy: WFL for tasks assessed/performed Overall Cognitive Status: Within Functional Limits for tasks assessed                                 General Comments: Reports not feeling  well this morning.      Exercises      General Comments General comments (skin integrity, edema, etc.): Pt incontinent of stool during session, declined transfer to chair so returned to bed and changed linens, sheets and cleaned up pt.      Pertinent Vitals/Pain Pain Assessment: Faces Faces Pain Scale: Hurts even more Pain Location: abdomen Pain Descriptors / Indicators: Aching;Discomfort Pain Intervention(s): Monitored during  session;Repositioned    Home Living                      Prior Function            PT Goals (current goals can now be found in the care plan section) Progress towards PT goals: Not progressing toward goals - comment (not feeling well, stool incontinence)    Frequency    Min 4X/week      PT Plan Current plan remains appropriate    Co-evaluation              AM-PAC PT "6 Clicks" Mobility   Outcome Measure  Help needed turning from your back to your side while in a flat bed without using bedrails?: A Little Help needed moving from lying on your back to sitting on the side of a flat bed without using bedrails?: A Little Help needed moving to and from a bed to a chair (including a wheelchair)?: A Lot Help needed standing up from a chair using your arms (e.g., wheelchair or bedside chair)?: A Lot Help needed to walk in hospital room?: A Lot Help needed climbing 3-5 steps with a railing? : Total 6 Click Score: 13    End of Session Equipment Utilized During Treatment: Gait belt Activity Tolerance: Patient limited by fatigue;Other (comment) (incontinent of stool) Patient left: in bed;with call bell/phone within reach;with bed alarm set Nurse Communication: Mobility status PT Visit Diagnosis: Other abnormalities of gait and mobility (R26.89);Muscle weakness (generalized) (M62.81);Other symptoms and signs involving the nervous system (R29.898);Unsteadiness on feet (R26.81);Difficulty in walking, not elsewhere classified (R26.2);Pain Pain - part of body:  (abdomen)     Time: 0109-3235 PT Time Calculation (min) (ACUTE ONLY): 34 min  Charges:  $Therapeutic Activity: 23-37 mins                     Marisa Severin, PT, DPT Acute Rehabilitation Services Pager [phone] Office [place] 02/16/2020, 11:25 AM

## 2020-02-16 NOTE — Progress Notes (Signed)
Inpatient Rehab Admissions Coordinator:   I do not have a CIR bed for this pt. Today. I do have insurance auth and am hopeful we can get him in sometime this week.   Clemens Catholic, [place], [place] Admissions Coordinator  [phone] ([place]) [phone] (office)

## 2020-02-16 NOTE — Progress Notes (Signed)
PROGRESS NOTE  Jon Townsend [id number] DOB: 29-May-1955 DOA: 02/03/2020 PCP: [name], DO   LOS: 13 days   Brief Narrative / Interim history:  64 y.o.malewithhistory of hypertension, hyperlipidemia, diabetes mellitus type 2 who has had Covid infection in March 2021 was given antibodies following which symptoms resolved has been having some upper respiratory tract symptoms last month for which patient received prednisone antibiotics and also last week for which again patient received prednisone. Patient started experiencing tingling and numbness of the upper extremities on Sunday February 01, 2020 which slowly progressed proximally and also started involving the lower extremities. At that point patient came to the ER was evaluated by neurologist and outpatient further work-up was recommended. But since patient's symptoms got more worse with weakness of the lower extremities patient decided to come to the ER. Patient also has been having low back pain denies any fever chills or any incontinence of urine or bowel.  He was diagnosed with Guillain-Barr syndrome status post IVIG  Subjective / 24h Interval events: Diarrhea is slowing down, but feels weak.  He is now off all his laxatives.  Assessment & Plan: Principal Problem Guillain-Barr syndrome, polyneuropathy-neurology consulted, he is status post 5 days of IVIG.  Much improved, PT recommended CIR, placement pending, insurance authorization came through but no beds available  Active Problems Severe constipation, abdominal pain, blood in the stool -This is probably triggered by severe constipation in the setting of prolonged narcotic use.  Patient was placed on aggressive bowel regimen with excellent results however started having abdominal cramping and traces of blood in the stool -Abdominal x-ray showed concern for ileus.  CT scan of the abdomen and pelvis concerning for colitis likely in the setting of severe  constipation.  He has been having traces of blood with his stools, he tells me that he has had dose at home when he was constipated.  Hemoglobin stable.  No frank bleeding. -He has had significant GI losses in the last couple of days, he feels quite weak, will give some IV fluids limited to 12 hours today  Acute urinary retention -Possibly due to increased intra-abdominal pressure in the setting of severe constipation, narcotic use, immobilization, has a Foley catheter in place.  Do voiding trial in 2 to 3 days once more mobile  Leukocytosis -Stable, urinalysis without evidence of infection  Low back pain-continue oxycodone, would favor that versus IV pain medications  COVID-19 infection-asymptomatic, s/p 10 days of isolation, removed 2/4  DM2-continue sliding scale  CBG (last 3)  Recent Labs    02/15/20 2139 02/16/20 0618 02/16/20 1129  GLUCAP 176* 208* 236*   Hypertension-continue medications as below  Hyperlipidemia-continue statin  Hyponatremia-dehydrated now, continue fluids and monitor  Scheduled Meds: . amLODipine  5 mg Oral Daily  . atorvastatin  40 mg Oral QHS  . Chlorhexidine Gluconate Cloth  6 each Topical Daily  . docusate sodium  100 mg Oral BID  . fexofenadine  60 mg Oral BID  . gabapentin  100 mg Oral BID  . insulin aspart  0-9 Units Subcutaneous TID WC  . latanoprost  1 drop Both Eyes QHS  . lidocaine  1 patch Transdermal Q24H  . metoprolol tartrate  50 mg Oral BID  . tamsulosin  0.4 mg Oral Daily   Continuous Infusions:  PRN Meds:.acetaminophen **OR** acetaminophen, HYDROmorphone (DILAUDID) injection, melatonin, ondansetron, oxyCODONE, sodium chloride  Diet Orders (From admission, onward)    Start     Ordered   02/10/20 1243  Diet  heart healthy/carb modified Room service appropriate? Yes; Fluid consistency: Thin; Fluid restriction: 1500 mL Fluid  Diet effective now       Question Answer Comment  Diet-HS Snack? Nothing   Room service appropriate? Yes    Fluid consistency: Thin   Fluid restriction: 1500 mL Fluid      02/10/20 1242          DVT prophylaxis: SCDs Start: 02/03/20 2129     Code Status: Full Code  Family Communication: no faimly at bedside, updated daughter over the phone  Status is: Inpatient  Remains inpatient appropriate because:Inpatient level of care appropriate due to severity of illness   Dispo: The patient is from: Home              Anticipated d/c is to: CIR              Anticipated d/c date is: 1 day              Patient currently is medically stable to d/c.   Difficult to place patient No  Level of care: Telemetry Medical  Consultants:  Neurology   Procedures:  None   Microbiology  None   Antimicrobials: None     Objective: Vitals:   02/15/20 2357 02/16/20 0447 02/16/20 0750 02/16/20 1132  BP: 125/81 (!) 154/73 (!) 143/85 133/77  Pulse: 84 91 70 91  Resp: 17 16 18 16   Temp: 98.1 F (36.7 C) 98.1 F (36.7 C) 97.9 F (36.6 C) 98.1 F (36.7 C)  TempSrc: Oral Oral Oral Oral  SpO2: 95% 96% 96% 95%  Weight:      Height:        Intake/Output Summary (Last 24 hours) at 02/16/2020 1325 Last data filed at 02/16/2020 6269 Gross per 24 hour  Intake 917.28 ml  Output 900 ml  Net 17.28 ml   Filed Weights   02/03/20 1228  Weight: 113.4 kg    Examination:  Constitutional: No distress Eyes: No icterus ENMT: mmm Neck: normal, supple Respiratory: cta bil Cardiovascular: rrr, no mrg Abdomen: soft, uncomfortable on palpation, no guarding or rebound  Musculoskeletal: no clubbing / cyanosis.  Skin: no rashes Neurologic: no focal deficits   Data Reviewed: I have independently reviewed following labs and imaging studies   CBC: Recent Labs  Lab 02/12/20 0333 02/14/20 0215 02/15/20 0725 02/16/20 0219  WBC 7.3 7.6 15.6* 16.9*  HGB 15.1 14.5 16.1 15.2  HCT 44.3 40.7 46.3 43.3  MCV 89.1 88.3 88.0 88.7  PLT 150 PLATELET CLUMPS NOTED ON SMEAR, UNABLE TO ESTIMATE 112* 100*    Basic Metabolic Panel: Recent Labs  Lab 02/11/20 0405 02/12/20 0333 02/14/20 0215 02/15/20 0725 02/16/20 0219  NA 129* 128* 127* 124* 127*  K 4.2 4.5 4.3 5.0 4.5  CL 96* 97* 94* 93* 92*  CO2 26 23 26  20* 25  GLUCOSE 178* 178* 245* 226* 177*  BUN 27* 25* 21 23 25*  CREATININE 0.81 0.81 0.83 0.76 0.96  CALCIUM 8.7* 8.8* 8.5* 8.5* 8.4*   Liver Function Tests: Recent Labs  Lab 02/10/20 0339 02/11/20 0405 02/15/20 0725 02/16/20 0219  AST 32 32 43* 28  ALT 34 35 38 37  ALKPHOS 56 60 72 72  BILITOT 1.4* 1.2 1.9* 1.3*  PROT 8.4* 8.0 7.1 6.5  ALBUMIN 2.8* 2.8* 2.5* 2.3*   Coagulation Profile: No results for input(s): INR, PROTIME in the last 168 hours. HbA1C: No results for input(s): HGBA1C in the last 72 hours. CBG: Recent Labs  Lab 02/15/20 1227 02/15/20 1557 02/15/20 2139 02/16/20 0618 02/16/20 1129  GLUCAP 242* 210* 176* 208* 236*    No results found for this or any previous visit (from the past 240 hour(s)).   Radiology Studies: No results found. Time spent: 35 minutes  Marzetta Board, MD, PhD [facility]  Between 7 am - 7 pm I am available, please contact me via Amion or Securechat  Between 7 pm - 7 am I am not available, please contact night coverage MD/APP via Amion

## 2020-02-17 LAB — CBC
HCT: 42.1 % (ref 39.0–52.0)
Hemoglobin: 14.5 g/dL (ref 13.0–17.0)
MCH: 30.9 pg (ref 26.0–34.0)
MCHC: 34.4 g/dL (ref 30.0–36.0)
MCV: 89.6 fL (ref 80.0–100.0)
Platelets: 107 10*3/uL — ABNORMAL LOW (ref 150–400)
RBC: 4.7 MIL/uL (ref 4.22–5.81)
RDW: 12.4 % (ref 11.5–15.5)
WBC: 14.1 10*3/uL — ABNORMAL HIGH (ref 4.0–10.5)
nRBC: 0 % (ref 0.0–0.2)

## 2020-02-17 LAB — GLUCOSE, CAPILLARY
Glucose-Capillary: 166 mg/dL — ABNORMAL HIGH (ref 70–99)
Glucose-Capillary: 277 mg/dL — ABNORMAL HIGH (ref 70–99)
Glucose-Capillary: 111 mg/dL — ABNORMAL HIGH (ref 70–99)
Glucose-Capillary: 181 mg/dL — ABNORMAL HIGH (ref 70–99)

## 2020-02-17 LAB — BASIC METABOLIC PANEL
Anion gap: 11 (ref 5–15)
BUN: 25 mg/dL — ABNORMAL HIGH (ref 8–23)
CO2: 24 mmol/L (ref 22–32)
Calcium: 8.3 mg/dL — ABNORMAL LOW (ref 8.9–10.3)
Chloride: 94 mmol/L — ABNORMAL LOW (ref 98–111)
Creatinine, Ser: 0.89 mg/dL (ref 0.61–1.24)
GFR, Estimated: >60 mL/min (ref >60)
Glucose, Bld: 155 mg/dL — ABNORMAL HIGH (ref 70–99)
Potassium: 4.8 mmol/L (ref 3.5–5.1)
Sodium: 129 mmol/L — ABNORMAL LOW (ref 135–145)

## 2020-02-17 LAB — MISC LABCORP TEST (SEND OUT): Labcorp test code: 9985

## 2020-02-17 NOTE — Plan of Care (Signed)

## 2020-02-17 NOTE — Plan of Care (Signed)
°  Problem: Education: Goal: Knowledge of General Education information will improve Description: Including pain rating scale, medication(s)/side effects and non-pharmacologic comfort measures Outcome: Progressing   Problem: Clinical Measurements: Goal: Ability to maintain clinical measurements within normal limits will improve Outcome: Progressing Goal: Will remain free from infection Outcome: Progressing Goal: Diagnostic test results will improve Outcome: Progressing Goal: Respiratory complications will improve Outcome: Progressing Goal: Cardiovascular complication will be avoided Outcome: Progressing   Problem: Activity: Goal: Risk for activity intolerance will decrease Outcome: Progressing   Problem: Nutrition: Goal: Adequate nutrition will be maintained Outcome: Progressing   Problem: Coping: Goal: Level of anxiety will decrease Outcome: Progressing   Problem: Elimination: Goal: Will not experience complications related to bowel motility Outcome: Progressing Goal: Will not experience complications related to urinary retention Outcome: Progressing   Problem: Pain Managment: Goal: General experience of comfort will improve Outcome: Progressing   Problem: Safety: Goal: Ability to remain free from injury will improve Outcome: Progressing   Problem: Skin Integrity: Goal: Risk for impaired skin integrity will decrease Outcome: Progressing   

## 2020-02-17 NOTE — Progress Notes (Signed)
Occupational Therapy Treatment Patient Details Name: Jon Townsend MRN: [id number] DOB: 1955/06/06 Today's Date: 02/17/2020    History of present illness 65 y.o. male with history of HTN, HLD, DM who has had Covid infection in March 2021 was given antibodies following which symptoms resolved has been having some upper respiratory tract symptoms last month. Patient started experiencing tingling and numbness of the upper extremities on 02/01/20 which slowly progressed proximally and also started involving the lower extremities. Admitted for polyneuropathy workup. Found to have Guillain-Barre.   OT comments  OT treatment session with focus on self-care re-education, activity tolerance and NMR. Patient making steady progress toward goals demonstrating ability to complete functional transfers with +1 assist. Patient with fatigue from previous PT treatment session with desire to complete grooming tasks at sink level with use of Stedy. Upon initial evaluation patient completing sit to stand transfers in [place] with Max A +2. On this date patient able to complete sit to stand from recliner to [place] with Min A +2 for safety. Patient able to manipulate toothbrush and toothpaste without external assist to complete oral hygiene. Plan for completion of toileting tasks and grooming tasks without use of Stedy at time of next treatment session. Due to fatigue, patient with request to return to supine. No external assist required to advance BLE from EOB to bed surface. OT will continue to follow acutely.     Follow Up Recommendations  CIR;Supervision/Assistance - 24 hour    Equipment Recommendations  Other (comment) (Defer to next level of care)    Recommendations for Other Services      Precautions / Restrictions Precautions Precautions: Fall Precaution Comments: off COVID precautions as of 02/13/20 Restrictions Weight Bearing Restrictions: No       Mobility Bed Mobility Overal bed mobility: Needs  Assistance Bed Mobility: Sit to Supine     Supine to sit: Min guard;HOB elevated Sit to supine: Min guard;HOB elevated   General bed mobility comments: Patient able to control trunk descent and advance BLE from EOB to bed surface without external assist. +rail.  Transfers Overall transfer level: Needs assistance Equipment used: Rolling walker (2 wheeled) Transfers: Sit to/from Stand Sit to Stand: Min assist;+2 physical assistance         General transfer comment: Patient with fatigue from previous PT session. Request to complete grooming tasks with use of Stedy at sink level.    Balance Overall balance assessment: Needs assistance Sitting-balance support: Feet supported;No upper extremity supported Sitting balance-Leahy Scale: Fair Sitting balance - Comments: supervision for safety.   Standing balance support: During functional activity Standing balance-Leahy Scale: Poor Standing balance comment: Min guard-Min A for standing balance. Stood for ~1-2 minutes for pericare, fatigues due to weakness in BLEs.                           ADL either performed or assessed with clinical judgement   ADL       Grooming: Set up Grooming Details (indicate cue type and reason): Patient tolerated 3/3 grooming tasks in perched position in [place] at sink. Will attempt standing at sink with chair directly behind patient at time of next session.                                     Vision       Perception     Praxis  Cognition Arousal/Alertness: Awake/alert Behavior During Therapy: WFL for tasks assessed/performed Overall Cognitive Status: Within Functional Limits for tasks assessed                                 General Comments: Patient feeling better this date after several "bad days". Good motivation for participation with therapy efforts.        Exercises     Shoulder Instructions       General Comments Incontinent of stool  upon standing. Cleaned pt up, changed socks and changed bed pad.    Pertinent Vitals/ Pain       Pain Assessment: Faces Faces Pain Scale: Hurts little more Pain Location: abdomen Pain Descriptors / Indicators: Aching;Discomfort Pain Intervention(s): Limited activity within patient's tolerance;Monitored during session;Repositioned  Home Living                                          Prior Functioning/Environment              Frequency  Min 2X/week        Progress Toward Goals  OT Goals(current goals can now be found in the care plan section)  Progress towards OT goals: Progressing toward goals  Acute Rehab OT Goals Patient Stated Goal: to return to independent mobility OT Goal Formulation: With patient Time For Goal Achievement: 02/19/20 Potential to Achieve Goals: Good ADL Goals Pt Will Perform Lower Body Bathing: with set-up;with supervision;sit to/from stand;sitting/lateral leans Pt Will Perform Lower Body Dressing: with set-up;with supervision;sit to/from stand;sitting/lateral leans Pt Will Transfer to Toilet: with min assist;ambulating Pt Will Perform Toileting - Clothing Manipulation and hygiene: with set-up;with supervision;sitting/lateral leans Pt/caregiver will Perform Home Exercise Program: Both right and left upper extremity;With theraband;With written HEP provided;Independently;Increased strength  Plan Discharge plan remains appropriate;Frequency remains appropriate    Co-evaluation                 AM-PAC OT "6 Clicks" Daily Activity     Outcome Measure   Help from another person eating meals?: A Little Help from another person taking care of personal grooming?: A Little Help from another person toileting, which includes using toliet, bedpan, or urinal?: A Lot Help from another person bathing (including washing, rinsing, drying)?: A Lot Help from another person to put on and taking off regular upper body clothing?: A  Little Help from another person to put on and taking off regular lower body clothing?: A Lot 6 Click Score: 15    End of Session Equipment Utilized During Treatment: Gait belt  OT Visit Diagnosis: Unsteadiness on feet (R26.81);Other abnormalities of gait and mobility (R26.89);Muscle weakness (generalized) (M62.81)   Activity Tolerance Patient tolerated treatment well   Patient Left in bed;with call bell/phone within reach;with bed alarm set   Nurse Communication Mobility status        Time: 7741-2878 OT Time Calculation (min): 21 min  Charges: OT General Charges $OT Visit: 1 Visit OT Treatments $Self Care/Home Management : 8-22 mins  Jon Townsend H. OTR/L Supplemental OT, Department of rehab services [phone]   Jon Hakeem R H. 02/17/2020, 1:21 PM

## 2020-02-17 NOTE — Progress Notes (Signed)
Physical Therapy Treatment Patient Details Name: Wyn Nettle MRN: [id number] DOB: [birth year] Today's Date: 02/17/2020    History of Present Illness 65 y.o. male with history of HTN, HLD, DM who has had Covid infection in March 2021 was given antibodies following which symptoms resolved has been having some upper respiratory tract symptoms last month. Patient started experiencing tingling and numbness of the upper extremities on 02/01/20 which slowly progressed proximally and also started involving the lower extremities. Admitted for polyneuropathy workup. Found to have Guillain-Barre.    PT Comments    Patient progressing well towards PT goals. Improved ambulation distance today with Min A and chair follow for balance/safety. Noted to have bil knee/ankle instability but no buckling. HR up to 140s bpm with activity, returned to 115-120s bpm with rest. Requires Min-Mod A of 2 for standing with better technique today with cues. Pt incontinent of stool upon standing. Able to work on standing tolerance for pericare. Eager to get to rehab. "I hope they have a bed for me today." Continue to recommend CIR. Will follow.    Follow Up Recommendations  CIR     Equipment Recommendations  Rolling walker with 5" wheels;Wheelchair (measurements PT);3in1 (PT)    Recommendations for Other Services       Precautions / Restrictions Precautions Precautions: Fall Precaution Comments: off COVID precautions as of 02/13/20 Restrictions Weight Bearing Restrictions: No    Mobility  Bed Mobility Overal bed mobility: Needs Assistance Bed Mobility: Supine to Sit     Supine to sit: Min guard;HOB elevated     General bed mobility comments: Heavy use of rail to get to EOB with increased time.  Transfers Overall transfer level: Needs assistance Equipment used: Rolling walker (2 wheeled) Transfers: Sit to/from Stand Sit to Stand: Mod assist;Min assist;+2 physical assistance;+2 safety/equipment          General transfer comment: Mod A progressing to Min A of 2 to stand from EOB and chair x4 with cues for hand placement as pt pulling up on RW. Incontinent of stool upon standing. Transferred to chair post ambulation.  Ambulation/Gait Ambulation/Gait assistance: Min assist;+2 safety/equipment Gait Distance (Feet): 14 Feet Assistive device: Rolling walker (2 wheeled) Gait Pattern/deviations: Step-through pattern;Step-to pattern;Trunk flexed;Decreased step length - right;Decreased step length - left Gait velocity: reduced   General Gait Details: Slow, unsteady gait with bil knee and ankle instability laterally; heavy WB through BUEs. HR up to 140s bpm with activity. Increased RR.   Stairs             Wheelchair Mobility    Modified Rankin (Stroke Patients Only)       Balance Overall balance assessment: Needs assistance Sitting-balance support: Feet supported;No upper extremity supported Sitting balance-Leahy Scale: Fair Sitting balance - Comments: supervision for safety.   Standing balance support: During functional activity Standing balance-Leahy Scale: Poor Standing balance comment: Min guard-Min A for standing balance. Stood for ~1-2 minutes for pericare, fatigues due to weakness in BLEs.                            Cognition Arousal/Alertness: Awake/alert Behavior During Therapy: WFL for tasks assessed/performed Overall Cognitive Status: Within Functional Limits for tasks assessed                                 General Comments: Reports feeling better today and eager to get OOB today. "i am  excited to see you!"      Exercises      General Comments General comments (skin integrity, edema, etc.): Incontinent of stool upon standing. Cleaned pt up, changed socks and changed bed pad.      Pertinent Vitals/Pain Pain Assessment: No/denies pain    Home Living                      Prior Function            PT Goals (current  goals can now be found in the care plan section) Progress towards PT goals: Progressing toward goals    Frequency    Min 4X/week      PT Plan Current plan remains appropriate    Co-evaluation              AM-PAC PT "6 Clicks" Mobility   Outcome Measure  Help needed turning from your back to your side while in a flat bed without using bedrails?: A Little Help needed moving from lying on your back to sitting on the side of a flat bed without using bedrails?: A Little Help needed moving to and from a bed to a chair (including a wheelchair)?: A Lot Help needed standing up from a chair using your arms (e.g., wheelchair or bedside chair)?: A Lot Help needed to walk in hospital room?: A Little Help needed climbing 3-5 steps with a railing? : A Lot 6 Click Score: 15    End of Session Equipment Utilized During Treatment: Gait belt Activity Tolerance: Patient tolerated treatment well Patient left: in chair;with call bell/phone within reach;with chair alarm set Nurse Communication: Mobility status PT Visit Diagnosis: Other abnormalities of gait and mobility (R26.89);Muscle weakness (generalized) (M62.81);Other symptoms and signs involving the nervous system (R29.898);Unsteadiness on feet (R26.81);Difficulty in walking, not elsewhere classified (R26.2);Pain     Time: 0211-1735 PT Time Calculation (min) (ACUTE ONLY): 30 min  Charges:  $Gait Training: 8-22 mins $Therapeutic Activity: 8-22 mins                     Marisa Severin, PT, DPT Acute Rehabilitation Services Pager [phone] Office [place] 02/17/2020, 11:46 AM

## 2020-02-17 NOTE — Progress Notes (Signed)
Inpatient Rehab Admissions Coordinator:   I do not have a bed for this Pt. Today; however, he is CIR's first choice for a back-up if anyone goes home unexpectedly today. Will continue to follow for potential admit pending bed availability.   Clemens Catholic, [place], [place] Admissions Coordinator  [phone] ([place]) [phone] (office)

## 2020-02-17 NOTE — Progress Notes (Signed)
PROGRESS NOTE  Minor Iden WUJ:[id number] DOB: 21-Nov-1955 DOA: 02/03/2020 PCP: [name], DO   LOS: 14 days   Brief Narrative / Interim history:  64 y.o.malewithhistory of hypertension, hyperlipidemia, diabetes mellitus type 2 who has had Covid infection in March 2021 was given antibodies following which symptoms resolved has been having some upper respiratory tract symptoms last month for which patient received prednisone antibiotics and also last week for which again patient received prednisone. Patient started experiencing tingling and numbness of the upper extremities on Sunday February 01, 2020 which slowly progressed proximally and also started involving the lower extremities. At that point patient came to the ER was evaluated by neurologist and outpatient further work-up was recommended. But since patient's symptoms got more worse with weakness of the lower extremities patient decided to come to the ER. Patient also has been having low back pain denies any fever chills or any incontinence of urine or bowel.  He was diagnosed with Guillain-Barr syndrome status post IVIG  Subjective / 24h Interval events: He is feeling stronger today.  Diarrhea is slowing down.  Assessment & Plan: Principal Problem Guillain-Barr syndrome, polyneuropathy-neurology consulted, he is status post 5 days of IVIG.  Much improved, PT recommended CIR, placement pending, insurance authorization came through but no beds available, hopefully within the next 24 hours as apparently he is first on the list  Active Problems Severe constipation, abdominal pain, blood in the stool -This is probably triggered by severe constipation in the setting of prolonged narcotic use.  Patient was placed on aggressive bowel regimen with excellent results however started having abdominal cramping and traces of blood in the stool -Abdominal x-ray showed concern for ileus.  CT scan of the abdomen and pelvis  concerning for colitis likely in the setting of severe constipation.  He has been having traces of blood with his stools, he tells me that he has had dose at home when he was constipated.  Hemoglobin stable.  No frank bleeding. -He has had significant GI losses given diarrhea, was feeling weak yesterday, received IV fluids and feels stronger today  Acute urinary retention -Possibly due to increased intra-abdominal pressure in the setting of severe constipation, narcotic use, immobilization, has a Foley catheter in place.  Do voiding trial in 2 to 3 days once more mobile  Leukocytosis -Stable, urinalysis without evidence of infection, improving  Low back pain-continue oxycodone, would favor that versus IV pain medications  COVID-19 infection-asymptomatic, s/p 10 days of isolation, removed 2/4  DM2-continue sliding scale  CBG (last 3)  Recent Labs    02/16/20 2106 02/17/20 0607 02/17/20 1218  GLUCAP 149* 166* 277*   Hypertension-continue medications as below  Hyperlipidemia-continue statin  Hyponatremia-dehydrated now, continue fluids and monitor  Scheduled Meds: . amLODipine  5 mg Oral Daily  . atorvastatin  40 mg Oral QHS  . Chlorhexidine Gluconate Cloth  6 each Topical Daily  . docusate sodium  100 mg Oral BID  . fexofenadine  60 mg Oral BID  . gabapentin  100 mg Oral BID  . insulin aspart  0-9 Units Subcutaneous TID WC  . latanoprost  1 drop Both Eyes QHS  . lidocaine  1 patch Transdermal Q24H  . metoprolol tartrate  50 mg Oral BID  . tamsulosin  0.4 mg Oral Daily  . trimethoprim-polymyxin b  1 drop Left Eye Q4H   Continuous Infusions:  PRN Meds:.acetaminophen **OR** acetaminophen, HYDROmorphone (DILAUDID) injection, melatonin, ondansetron, oxyCODONE, simethicone, sodium chloride  Diet Orders (From admission, onward)  Start     Ordered   02/10/20 1243  Diet heart healthy/carb modified Room service appropriate? Yes; Fluid consistency: Thin; Fluid restriction: 1500  mL Fluid  Diet effective now       Question Answer Comment  Diet-HS Snack? Nothing   Room service appropriate? Yes   Fluid consistency: Thin   Fluid restriction: 1500 mL Fluid      02/10/20 1242          DVT prophylaxis: SCDs Start: 02/03/20 2129     Code Status: Full Code  Family Communication: no faimly at bedside, updated daughter over the phone  Status is: Inpatient  Remains inpatient appropriate because:Inpatient level of care appropriate due to severity of illness   Dispo: The patient is from: Home              Anticipated d/c is to: CIR              Anticipated d/c date is: 1 day              Patient currently is medically stable to d/c.   Difficult to place patient No  Level of care: Telemetry Medical  Consultants:  Neurology   Procedures:  None   Microbiology  None   Antimicrobials: None     Objective: Vitals:   02/16/20 2315 02/17/20 0311 02/17/20 0805 02/17/20 1225  BP: (!) 124/58 132/79 123/78 112/60  Pulse: 89 91 96 80  Resp: 16 18 18 18   Temp: 98.4 F (36.9 C) 98 F (36.7 C) 98.4 F (36.9 C) 98.2 F (36.8 C)  TempSrc: Oral Oral Oral Oral  SpO2: 96% 95% 95% 95%  Weight:      Height:        Intake/Output Summary (Last 24 hours) at 02/17/2020 1326 Last data filed at 02/17/2020 0344 Gross per 24 hour  Intake 1200 ml  Output 775 ml  Net 425 ml   Filed Weights   02/03/20 1228  Weight: 113.4 kg    Examination:  Constitutional: No distress, in bed Eyes: No scleral icterus ENMT: mmm Neck: normal, supple Respiratory: Clear to auscultation bilaterally Cardiovascular: Regular rate and rhythm, no murmurs Abdomen: Soft, uncomfortable to palpation but no guarding, no rebound Musculoskeletal: no clubbing / cyanosis.  Skin: No rashes seen Neurologic: Nonfocal, equal strength   Data Reviewed: I have independently reviewed following labs and imaging studies   CBC: Recent Labs  Lab 02/12/20 0333 02/14/20 0215 02/15/20 0725  02/16/20 0219 02/17/20 0024  WBC 7.3 7.6 15.6* 16.9* 14.1*  HGB 15.1 14.5 16.1 15.2 14.5  HCT 44.3 40.7 46.3 43.3 42.1  MCV 89.1 88.3 88.0 88.7 89.6  PLT 150 PLATELET CLUMPS NOTED ON SMEAR, UNABLE TO ESTIMATE 112* 100* 607*   Basic Metabolic Panel: Recent Labs  Lab 02/12/20 0333 02/14/20 0215 02/15/20 0725 02/16/20 0219 02/17/20 0024  NA 128* 127* 124* 127* 129*  K 4.5 4.3 5.0 4.5 4.8  CL 97* 94* 93* 92* 94*  CO2 23 26 20* 25 24  GLUCOSE 178* 245* 226* 177* 155*  BUN 25* 21 23 25* 25*  CREATININE 0.81 0.83 0.76 0.96 0.89  CALCIUM 8.8* 8.5* 8.5* 8.4* 8.3*   Liver Function Tests: Recent Labs  Lab 02/11/20 0405 02/15/20 0725 02/16/20 0219  AST 32 43* 28  ALT 35 38 37  ALKPHOS 60 72 72  BILITOT 1.2 1.9* 1.3*  PROT 8.0 7.1 6.5  ALBUMIN 2.8* 2.5* 2.3*   Coagulation Profile: No results for input(s): INR, PROTIME in  the last 168 hours. HbA1C: No results for input(s): HGBA1C in the last 72 hours. CBG: Recent Labs  Lab 02/16/20 1129 02/16/20 1608 02/16/20 2106 02/17/20 0607 02/17/20 1218  GLUCAP 236* 200* 149* 166* 277*    No results found for this or any previous visit (from the past 240 hour(s)).   Radiology Studies: No results found. Time spent: 35 minutes  Marzetta Board, MD, PhD [facility]  Between 7 am - 7 pm I am available, please contact me via Amion or Securechat  Between 7 pm - 7 am I am not available, please contact night coverage MD/APP via Amion

## 2020-02-18 DIAGNOSIS — R531 Weakness: Secondary | ICD-10-CM

## 2020-02-18 LAB — GLUCOSE, CAPILLARY
Glucose-Capillary: 153 mg/dL — ABNORMAL HIGH (ref 70–99)
Glucose-Capillary: 251 mg/dL — ABNORMAL HIGH (ref 70–99)
Glucose-Capillary: 215 mg/dL — ABNORMAL HIGH (ref 70–99)
Glucose-Capillary: 250 mg/dL — ABNORMAL HIGH (ref 70–99)

## 2020-02-18 LAB — BASIC METABOLIC PANEL
Anion gap: 9 (ref 5–15)
BUN: 16 mg/dL (ref 8–23)
CO2: 23 mmol/L (ref 22–32)
Calcium: 8 mg/dL — ABNORMAL LOW (ref 8.9–10.3)
Chloride: 97 mmol/L — ABNORMAL LOW (ref 98–111)
Creatinine, Ser: 0.7 mg/dL (ref 0.61–1.24)
GFR, Estimated: >60 mL/min (ref >60)
Glucose, Bld: 152 mg/dL — ABNORMAL HIGH (ref 70–99)
Potassium: 3.8 mmol/L (ref 3.5–5.1)
Sodium: 129 mmol/L — ABNORMAL LOW (ref 135–145)

## 2020-02-18 LAB — CBC
HCT: 38.7 % — ABNORMAL LOW (ref 39.0–52.0)
Hemoglobin: 13.4 g/dL (ref 13.0–17.0)
MCH: 30.5 pg (ref 26.0–34.0)
MCHC: 34.6 g/dL (ref 30.0–36.0)
MCV: 88.2 fL (ref 80.0–100.0)
Platelets: 125 10*3/uL — ABNORMAL LOW (ref 150–400)
RBC: 4.39 MIL/uL (ref 4.22–5.81)
RDW: 12.2 % (ref 11.5–15.5)
WBC: 11.1 10*3/uL — ABNORMAL HIGH (ref 4.0–10.5)
nRBC: 0 % (ref 0.0–0.2)

## 2020-02-18 MED ORDER — GABAPENTIN 100 MG PO CAPS
100.0000 mg | ORAL_CAPSULE | Freq: Three times a day (TID) | ORAL | Status: DC
  Administered 2020-02-18 – 2020-02-19 (×2): 100 mg via ORAL
  Filled 2020-02-18 (×2): qty 1

## 2020-02-18 NOTE — Progress Notes (Addendum)
Inpatient Rehab Admissions Coordinator:   I do not have a bed on CIR today. I noted that Pt. Continues to get IV Dilaudid every 4 hours despite having orders for  Oral pain meds. This is a barrier to CIR admit, as we do not give IV pain meds on CIR. I discussed this with Pt. And he was encouraged to try to manage pain with oral pain meds throughout the day today. I will follow and pursue for potential admission later this week pending bed availability and medical readiness. I have notified the patient. Pt.'s daughter has been requesting updates, so I called her and left a voicemail with request for call back as well.    Clemens Catholic, [place], [place] Admissions Coordinator  [phone] ([place]) [phone] (office)

## 2020-02-18 NOTE — Progress Notes (Signed)
PROGRESS NOTE    Jon Townsend  KPT:[id number] DOB: 11/09/1955 DOA: 02/03/2020 PCP: [name], DO   Brief Narrative:  The patient is a 65 year old obese Caucasian male with a past medical history significant for but not limited to hypertension, hyperlipidemia, diabetes mellitus type 2 who had a Covid infection in March 2021 and was given antibiotics following which symptoms resolved.  Having some upper respiratory tract symptoms for the last month for which she received prednisone and antibiotics last week and again received prednisone.  He started experiencing numbness and tingling of his upper extremities on Saturday, January 31, 2018 with slowly progressed proximally and started involving his lower extremities.  At that point he came to the ED and was evaluated by the neurologist and was recommended for outpatient work-up and discharged home.  As his symptoms progressed and worsened with weakness of the lower extremity he decided to come back to the ED and he denied any back pain, fevers or chills or any incontinence of urine or bowel.  He has been diagnosed with Guillain-Barr syndrome and has been given IVIG.  His diarrhea is improved and he has recommended for CIR but currently no beds available and currently was on IV Dilaudid which has not been stopped.  He is medically stable and improved and awaiting insurance authorization and bed availability for CIR.  Assessment & Plan:   Principal Problem:   Polyneuropathy Active Problems:   Diabetes mellitus type 2 in obese (HCC)   Weakness   Essential hypertension   COVID-19 virus infection  Guillain-Barr syndrome, polyneuropathy -Neurology consulted, he is status post 5 days of IVIG.  Much improved,  -PT recommended CIR, placement pending, insurance authorization came through but no beds available, hopefully within the next 24 hours as apparently he is first on the list -Discontinue IV Pain Medications as it is a barrier to  CIR; C/w po Oxycodone 10 mg q4hprn  -Gabapentin 100 mg po BID to be increased to TID  Severe constipation, abdominal pain, blood in the stool -This is probably triggered by severe constipation in the setting of prolonged narcotic use.  Patient was placed on aggressive bowel regimen with excellent results however started having abdominal cramping and traces of blood in the stool -Abdominal x-ray showed concern for ileus.  CT scan of the abdomen and pelvis concerning for colitis likely in the setting of severe constipation.  He has been having traces of blood with his stools, he told Dr. Cruzita Lederer that he has had dose at home when he was constipated.  Hemoglobin stable.  No frank bleeding. -He has had significant GI losses given diarrhea, was feeling weak yesterday, received IV fluids and feels stronger today -Now off of IVF -C/w Bowel regimen with Docusate 100 mg pO BID,   Acute Urinary Retention -Possibly due to increased intra-abdominal pressure in the setting of severe constipation, narcotic use, immobilization, has a Foley catheter in place.  Do voiding trial in 2 to 3 days once more mobile and can be done at CIR  -C/w Tamsulosin 0.4 mg po Daily   Leukocytosis -Stable, urinalysis without evidence of infection, improving -WBC went from from 14.1 -> 11.1 -Continue to Monitor and Trend -Repeat CBC in the AM   Low back pain -Continue oxycodone 10 mg q4hprnn -IV pain medications with Dilaudid stopped  -C/w Gabapentin and Lidocaine   COVID-19 infection -Asymptomatic, s/p 10 days of isolation, removed 2/4 -Not coughing   DM2 -Continue Sensitive Novolog Sliding Scale AC -CBG's ranging from 111-251  Hypertension -Continue Amlodipine 5 mg po Daily and Metorprolol 50 mg po BID  Hyperlipidemia -Continue Atorvastatin 40 mg qHS  Hyponatremia -Mild at 129; Likely in the setting of Dehydration now -Continue IVF as above -Continue to Monitor and Trend -Repeat CMP in the AM    Obesity -Complicates overall prognosis and care -Estimated body mass index is 32.98 kg/m as calculated from the following:   Height as of this encounter: 6\' 1"  (1.854 m).   Weight as of this encounter: 113.4 kg. -Weight Loss and Dietary Counseling given   DVT prophylaxis: SCDs Code Status: FULL CODE  Family Communication: No family present at bedside  Disposition Plan: CIR as he is medically stable  Status is: Inpatient  Remains inpatient appropriate because:Unsafe d/c plan, IV treatments appropriate due to intensity of illness or inability to take PO and Inpatient level of care appropriate due to severity of illness   Dispo: The patient is from: Home              Anticipated d/c is to: CIR              Anticipated d/c date is: 1 day              Patient currently is medically stable to d/c.   Difficult to place patient No  Consultants:   Neurology  CIR    Procedures:   Antimicrobials:  Anti-infectives (From admission, onward)   None        Subjective: Seen and examined at bedside and was frustrated.  Denies any nausea or vomiting.  No lightheadedness or dizziness but continues to be extremely weak and has some continued tingling and numbness in his legs.  No chest pain, shortness of breath.  No other concerns or clots this time.  Objective: Vitals:   02/18/20 0337 02/18/20 0728 02/18/20 1128 02/18/20 1615  BP: 121/71 136/73 121/74 134/79  Pulse: 80 84 97 86  Resp: 18 20 20 20   Temp: 98.2 F (36.8 C) 97.7 F (36.5 C) 97.7 F (36.5 C) 98.2 F (36.8 C)  TempSrc: Oral Oral Oral Oral  SpO2: 95% 97% 98% 96%  Weight:      Height:        Intake/Output Summary (Last 24 hours) at 02/18/2020 1808 Last data filed at 02/18/2020 0414 Gross per 24 hour  Intake -  Output 800 ml  Net -800 ml   Filed Weights   02/03/20 1228  Weight: 113.4 kg   Examination: Physical Exam:  Constitutional: WN/WD obese Caucasian male currently in no acute distress appears quite  frustrated and is mildly uncomfortable Eyes: Lids and conjunctivae normal, sclerae anicteric  ENMT: External Ears, Nose appear normal. Grossly normal hearing. Neck: Appears normal, supple, no cervical masses, normal ROM, no appreciable thyromegaly; no JVD Respiratory: Diminished to auscultation bilaterally, no wheezing, rales, rhonchi or crackles. Normal respiratory effort and patient is not tachypenic. No accessory muscle use.  Unlabored breathing Cardiovascular: RRR, no murmurs / rubs / gallops. S1 and S2 auscultated.  Trace extremity edema Abdomen: Soft, non-tender, non-distended.  Bowel sounds positive.  GU: Deferred. Musculoskeletal: No clubbing / cyanosis of digits/nails. No joint deformity upper and lower extremities.  Skin: No rashes, lesions, ulcers on to skin evaluation. No induration; Warm and dry.  Neurologic: CN 2-12 grossly intact with no focal deficits. Romberg sign and cerebellar reflexes not assessed.  Psychiatric: Normal judgment and insight. Alert and oriented x 3.  Slightly frustrated mood and appropriate affect.   Data Reviewed: I  have personally reviewed following labs and imaging studies  CBC: Recent Labs  Lab 02/14/20 0215 02/15/20 0725 02/16/20 0219 02/17/20 0024 02/18/20 0342  WBC 7.6 15.6* 16.9* 14.1* 11.1*  HGB 14.5 16.1 15.2 14.5 13.4  HCT 40.7 46.3 43.3 42.1 38.7*  MCV 88.3 88.0 88.7 89.6 88.2  PLT PLATELET CLUMPS NOTED ON SMEAR, UNABLE TO ESTIMATE 112* 100* 107* 323*   Basic Metabolic Panel: Recent Labs  Lab 02/14/20 0215 02/15/20 0725 02/16/20 0219 02/17/20 0024 02/18/20 0342  NA 127* 124* 127* 129* 129*  K 4.3 5.0 4.5 4.8 3.8  CL 94* 93* 92* 94* 97*  CO2 26 20* 25 24 23   GLUCOSE 245* 226* 177* 155* 152*  BUN 21 23 25* 25* 16  CREATININE 0.83 0.76 0.96 0.89 0.70  CALCIUM 8.5* 8.5* 8.4* 8.3* 8.0*   GFR: Estimated Creatinine Clearance: 119.9 mL/min (by C-G formula based on SCr of 0.7 mg/dL). Liver Function Tests: Recent Labs  Lab  02/15/20 0725 02/16/20 0219  AST 43* 28  ALT 38 37  ALKPHOS 72 72  BILITOT 1.9* 1.3*  PROT 7.1 6.5  ALBUMIN 2.5* 2.3*   No results for input(s): LIPASE, AMYLASE in the last 168 hours. No results for input(s): AMMONIA in the last 168 hours. Coagulation Profile: No results for input(s): INR, PROTIME in the last 168 hours. Cardiac Enzymes: No results for input(s): CKTOTAL, CKMB, CKMBINDEX, TROPONINI in the last 168 hours. BNP (last 3 results) No results for input(s): PROBNP in the last 8760 hours. HbA1C: No results for input(s): HGBA1C in the last 72 hours. CBG: Recent Labs  Lab 02/17/20 1607 02/17/20 2103 02/18/20 0605 02/18/20 1129 02/18/20 1614  GLUCAP 111* 181* 153* 251* 215*   Lipid Profile: No results for input(s): CHOL, HDL, LDLCALC, TRIG, CHOLHDL, LDLDIRECT in the last 72 hours. Thyroid Function Tests: No results for input(s): TSH, T4TOTAL, FREET4, T3FREE, THYROIDAB in the last 72 hours. Anemia Panel: No results for input(s): VITAMINB12, FOLATE, FERRITIN, TIBC, IRON, RETICCTPCT in the last 72 hours. Sepsis Labs: No results for input(s): PROCALCITON, LATICACIDVEN in the last 168 hours.  No results found for this or any previous visit (from the past 240 hour(s)).   RN Pressure Injury Documentation:     Estimated body mass index is 32.98 kg/m as calculated from the following:   Height as of this encounter: 6\' 1"  (1.854 m).   Weight as of this encounter: 113.4 kg.  Malnutrition Type:   Malnutrition Characteristics:   Nutrition Interventions:    Radiology Studies: No results found.  Scheduled Meds: . amLODipine  5 mg Oral Daily  . atorvastatin  40 mg Oral QHS  . Chlorhexidine Gluconate Cloth  6 each Topical Daily  . docusate sodium  100 mg Oral BID  . fexofenadine  60 mg Oral BID  . gabapentin  100 mg Oral BID  . insulin aspart  0-9 Units Subcutaneous TID WC  . latanoprost  1 drop Both Eyes QHS  . lidocaine  1 patch Transdermal Q24H  . metoprolol  tartrate  50 mg Oral BID  . tamsulosin  0.4 mg Oral Daily  . trimethoprim-polymyxin b  1 drop Left Eye Q4H   Continuous Infusions:   LOS: 15 days   Kerney Elbe, DO [facility] PAGER is on AMION  If 7PM-7AM, please contact night-coverage [email or web address]

## 2020-02-18 NOTE — Progress Notes (Signed)
Physical Therapy Treatment Patient Details Name: Jon Townsend MRN: [id number] DOB: [birth year] Today's Date: 02/18/2020    History of Present Illness 65 y.o. male with history of HTN, HLD, DM who has had Covid infection in March 2021 was given antibodies following which symptoms resolved has been having some upper respiratory tract symptoms last month. Patient started experiencing tingling and numbness of the upper extremities on 02/01/20 which slowly progressed proximally and also started involving the lower extremities. Admitted for polyneuropathy workup. Found to have Guillain-Barre.    PT Comments    Pt very motivated to participate and improve, asking about what he can do while lying in bed to gain more strength. Educated pt with pt performing a couple reps of each to encourage learning and memory of exercises, such as: rolling in bed, SLR/marching, LAQ, hip abd/add, and bridging. Focused remainder of session on progressing gait, with him demonstrating progress ambulating x2 bouts of ~40-50 ft each bout with RW and minA and chair follow. Pt still limited in household mobility and at risk for falls. Will continue to follow acutely. Current recommendations remain appropriate.   Follow Up Recommendations  CIR     Equipment Recommendations  Rolling walker with 5" wheels;Wheelchair (measurements PT);3in1 (PT)    Recommendations for Other Services       Precautions / Restrictions Precautions Precautions: Fall Precaution Comments: off COVID precautions as of 02/13/20 Restrictions Weight Bearing Restrictions: No    Mobility  Bed Mobility Overal bed mobility: Needs Assistance Bed Mobility: Supine to Sit     Supine to sit: Min guard;HOB elevated     General bed mobility comments: Heavy use of rail to get to EOB with increased time.  Transfers Overall transfer level: Needs assistance Equipment used: Rolling walker (2 wheeled) Transfers: Sit to/from Stand Sit to Stand: Min  assist;+2 physical assistance;+2 safety/equipment         General transfer comment: Min A of 2 to stand from EOB 1x and chair 1x with cues for hand placement as pt pulling up on RW. Pt would follow direction for one hand on current sitting surface to push up from and other on RW as he had difficulty transferring his hands to the RW on one failed attempt to come to stand.  Ambulation/Gait Ambulation/Gait assistance: Min assist;+2 safety/equipment Gait Distance (Feet): 50 Feet (x2 bouts of ~50 ft > ~40 ft) Assistive device: Rolling walker (2 wheeled) Gait Pattern/deviations: Step-through pattern;Trunk flexed;Decreased step length - right;Decreased step length - left;Decreased stride length Gait velocity: reduced Gait velocity interpretation: <1.31 ft/sec, indicative of household ambulator General Gait Details: Slow, unsteady gait with bil knee and ankle instability laterally; heavy WB through BUEs. Cued pt to perform heel-to-toe sequence with steps, with mod success. Pt remains within RW throughout. No overt LOB, minA for steadying and chair follow.   Stairs             Wheelchair Mobility    Modified Rankin (Stroke Patients Only)       Balance Overall balance assessment: Needs assistance Sitting-balance support: Feet supported;No upper extremity supported Sitting balance-Leahy Scale: Fair Sitting balance - Comments: supervision for safety.   Standing balance support: Bilateral upper extremity supported Standing balance-Leahy Scale: Poor Standing balance comment: Bil UE support on RW for stability.                            Cognition Arousal/Alertness: Awake/alert Behavior During Therapy: WFL for tasks assessed/performed Overall Cognitive Status:  Within Functional Limits for tasks assessed                                 General Comments: Pt pleasant and motivated to participate and improve.      Exercises      General Comments General  comments (skin integrity, edema, etc.): Educated pt on exercises he could do in bed and in chair to gain strength: rolling in bed, SLR/marching, LAQ, bridges, hip abd/add      Pertinent Vitals/Pain Pain Assessment: No/denies pain Pain Intervention(s): Monitored during session    Home Living                      Prior Function            PT Goals (current goals can now be found in the care plan section) Acute Rehab PT Goals Patient Stated Goal: to return to independent mobility PT Goal Formulation: With patient Time For Goal Achievement: 02/19/20 Potential to Achieve Goals: Good Progress towards PT goals: Progressing toward goals    Frequency    Min 4X/week      PT Plan Current plan remains appropriate    Co-evaluation              AM-PAC PT "6 Clicks" Mobility   Outcome Measure  Help needed turning from your back to your side while in a flat bed without using bedrails?: A Little Help needed moving from lying on your back to sitting on the side of a flat bed without using bedrails?: A Little Help needed moving to and from a bed to a chair (including a wheelchair)?: A Lot Help needed standing up from a chair using your arms (e.g., wheelchair or bedside chair)?: A Lot Help needed to walk in hospital room?: A Little Help needed climbing 3-5 steps with a railing? : A Lot 6 Click Score: 15    End of Session Equipment Utilized During Treatment: Gait belt Activity Tolerance: Patient tolerated treatment well Patient left: in chair;with call bell/phone within reach Nurse Communication: Mobility status PT Visit Diagnosis: Other abnormalities of gait and mobility (R26.89);Muscle weakness (generalized) (M62.81);Other symptoms and signs involving the nervous system (R29.898);Unsteadiness on feet (R26.81);Difficulty in walking, not elsewhere classified (R26.2)     Time: 8563-1497 PT Time Calculation (min) (ACUTE ONLY): 23 min  Charges:  $Gait Training: 23-37  mins                     Moishe Spice, PT, DPT Acute Rehabilitation Services  Pager: [phone] Office: [place] 02/18/2020, 10:39 AM

## 2020-02-19 ENCOUNTER — Inpatient Hospital Stay (HOSPITAL_COMMUNITY): Payer: Medicare HMO

## 2020-02-19 ENCOUNTER — Inpatient Hospital Stay (HOSPITAL_COMMUNITY)
Admission: RE | Admit: 2020-02-19 | Discharge: 2020-02-27 | DRG: 095 | Disposition: A | Discharge status: Home Health | Payer: Medicare HMO | Source: Intra-hospital | Attending: Physical Medicine & Rehabilitation | Admitting: Physical Medicine & Rehabilitation

## 2020-02-19 ENCOUNTER — Encounter (HOSPITAL_COMMUNITY): Payer: Self-pay | Admitting: Physical Medicine & Rehabilitation

## 2020-02-19 ENCOUNTER — Other Ambulatory Visit: Payer: Self-pay

## 2020-02-19 DIAGNOSIS — H44002 Unspecified purulent endophthalmitis, left eye: Secondary | ICD-10-CM | POA: Diagnosis present

## 2020-02-19 DIAGNOSIS — R140 Abdominal distension (gaseous): Secondary | ICD-10-CM | POA: Diagnosis not present

## 2020-02-19 DIAGNOSIS — R338 Other retention of urine: Secondary | ICD-10-CM | POA: Diagnosis present

## 2020-02-19 DIAGNOSIS — N401 Enlarged prostate with lower urinary tract symptoms: Secondary | ICD-10-CM | POA: Diagnosis present

## 2020-02-19 DIAGNOSIS — K9189 Other postprocedural complications and disorders of digestive system: Secondary | ICD-10-CM

## 2020-02-19 DIAGNOSIS — R933 Abnormal findings on diagnostic imaging of other parts of digestive tract: Secondary | ICD-10-CM

## 2020-02-19 DIAGNOSIS — R630 Anorexia: Secondary | ICD-10-CM | POA: Diagnosis present

## 2020-02-19 DIAGNOSIS — K921 Melena: Secondary | ICD-10-CM | POA: Diagnosis present

## 2020-02-19 DIAGNOSIS — U099 Post covid-19 condition, unspecified: Secondary | ICD-10-CM | POA: Diagnosis present

## 2020-02-19 DIAGNOSIS — K5903 Drug induced constipation: Secondary | ICD-10-CM | POA: Diagnosis present

## 2020-02-19 DIAGNOSIS — D62 Acute posthemorrhagic anemia: Secondary | ICD-10-CM | POA: Diagnosis present

## 2020-02-19 DIAGNOSIS — K5900 Constipation, unspecified: Secondary | ICD-10-CM

## 2020-02-19 DIAGNOSIS — M47816 Spondylosis without myelopathy or radiculopathy, lumbar region: Secondary | ICD-10-CM | POA: Diagnosis not present

## 2020-02-19 DIAGNOSIS — N400 Enlarged prostate without lower urinary tract symptoms: Secondary | ICD-10-CM | POA: Diagnosis present

## 2020-02-19 DIAGNOSIS — M48061 Spinal stenosis, lumbar region without neurogenic claudication: Secondary | ICD-10-CM | POA: Diagnosis present

## 2020-02-19 DIAGNOSIS — Z6832 Body mass index (BMI) 32.0-32.9, adult: Secondary | ICD-10-CM

## 2020-02-19 DIAGNOSIS — K560 Paralytic ileus: Secondary | ICD-10-CM | POA: Diagnosis not present

## 2020-02-19 DIAGNOSIS — E1165 Type 2 diabetes mellitus with hyperglycemia: Secondary | ICD-10-CM | POA: Diagnosis present

## 2020-02-19 DIAGNOSIS — E1169 Type 2 diabetes mellitus with other specified complication: Secondary | ICD-10-CM | POA: Diagnosis not present

## 2020-02-19 DIAGNOSIS — Y92239 Unspecified place in hospital as the place of occurrence of the external cause: Secondary | ICD-10-CM | POA: Diagnosis present

## 2020-02-19 DIAGNOSIS — R531 Weakness: Secondary | ICD-10-CM | POA: Diagnosis not present

## 2020-02-19 DIAGNOSIS — T40605A Adverse effect of unspecified narcotics, initial encounter: Secondary | ICD-10-CM | POA: Diagnosis present

## 2020-02-19 DIAGNOSIS — E871 Hypo-osmolality and hyponatremia: Secondary | ICD-10-CM | POA: Diagnosis present

## 2020-02-19 DIAGNOSIS — E669 Obesity, unspecified: Secondary | ICD-10-CM | POA: Diagnosis present

## 2020-02-19 DIAGNOSIS — G610 Guillain-Barre syndrome: Secondary | ICD-10-CM | POA: Diagnosis not present

## 2020-02-19 DIAGNOSIS — K567 Ileus, unspecified: Secondary | ICD-10-CM | POA: Diagnosis present

## 2020-02-19 DIAGNOSIS — Z87891 Personal history of nicotine dependence: Secondary | ICD-10-CM

## 2020-02-19 DIAGNOSIS — N179 Acute kidney failure, unspecified: Secondary | ICD-10-CM | POA: Diagnosis not present

## 2020-02-19 DIAGNOSIS — M4802 Spinal stenosis, cervical region: Secondary | ICD-10-CM | POA: Diagnosis present

## 2020-02-19 DIAGNOSIS — Z833 Family history of diabetes mellitus: Secondary | ICD-10-CM

## 2020-02-19 DIAGNOSIS — Z79899 Other long term (current) drug therapy: Secondary | ICD-10-CM | POA: Diagnosis not present

## 2020-02-19 DIAGNOSIS — Z79890 Hormone replacement therapy (postmenopausal): Secondary | ICD-10-CM

## 2020-02-19 DIAGNOSIS — Z8249 Family history of ischemic heart disease and other diseases of the circulatory system: Secondary | ICD-10-CM

## 2020-02-19 DIAGNOSIS — I10 Essential (primary) hypertension: Secondary | ICD-10-CM | POA: Diagnosis present

## 2020-02-19 DIAGNOSIS — G629 Polyneuropathy, unspecified: Secondary | ICD-10-CM | POA: Diagnosis not present

## 2020-02-19 DIAGNOSIS — N319 Neuromuscular dysfunction of bladder, unspecified: Secondary | ICD-10-CM | POA: Diagnosis present

## 2020-02-19 DIAGNOSIS — Z7984 Long term (current) use of oral hypoglycemic drugs: Secondary | ICD-10-CM | POA: Diagnosis not present

## 2020-02-19 DIAGNOSIS — E1142 Type 2 diabetes mellitus with diabetic polyneuropathy: Secondary | ICD-10-CM | POA: Diagnosis present

## 2020-02-19 DIAGNOSIS — Z825 Family history of asthma and other chronic lower respiratory diseases: Secondary | ICD-10-CM | POA: Diagnosis not present

## 2020-02-19 DIAGNOSIS — U071 COVID-19: Secondary | ICD-10-CM | POA: Diagnosis not present

## 2020-02-19 LAB — CBC WITH DIFFERENTIAL/PLATELET
Abs Immature Granulocytes: 0.07 10*3/uL (ref 0.00–0.07)
Basophils Absolute: 0.1 10*3/uL (ref 0.0–0.1)
Basophils Relative: 1 %
Eosinophils Absolute: 0.3 10*3/uL (ref 0.0–0.5)
Eosinophils Relative: 3 %
HCT: 38.9 % — ABNORMAL LOW (ref 39.0–52.0)
Hemoglobin: 13.6 g/dL (ref 13.0–17.0)
Immature Granulocytes: 1 %
Lymphocytes Relative: 17 %
Lymphs Abs: 1.5 10*3/uL (ref 0.7–4.0)
MCH: 31.1 pg (ref 26.0–34.0)
MCHC: 35 g/dL (ref 30.0–36.0)
MCV: 89 fL (ref 80.0–100.0)
Monocytes Absolute: 0.8 10*3/uL (ref 0.1–1.0)
Monocytes Relative: 9 %
Neutro Abs: 6.2 10*3/uL (ref 1.7–7.7)
Neutrophils Relative %: 69 %
Platelets: 154 10*3/uL (ref 150–400)
RBC: 4.37 MIL/uL (ref 4.22–5.81)
RDW: 12.4 % (ref 11.5–15.5)
WBC: 8.9 10*3/uL (ref 4.0–10.5)
nRBC: 0 % (ref 0.0–0.2)

## 2020-02-19 LAB — COMPREHENSIVE METABOLIC PANEL
ALT: 33 U/L (ref 0–44)
AST: 25 U/L (ref 15–41)
Albumin: 2.1 g/dL — ABNORMAL LOW (ref 3.5–5.0)
Alkaline Phosphatase: 79 U/L (ref 38–126)
Anion gap: 10 (ref 5–15)
BUN: 14 mg/dL (ref 8–23)
CO2: 27 mmol/L (ref 22–32)
Calcium: 8.5 mg/dL — ABNORMAL LOW (ref 8.9–10.3)
Chloride: 96 mmol/L — ABNORMAL LOW (ref 98–111)
Creatinine, Ser: 0.83 mg/dL (ref 0.61–1.24)
GFR, Estimated: >60 mL/min (ref >60)
Glucose, Bld: 140 mg/dL — ABNORMAL HIGH (ref 70–99)
Potassium: 4 mmol/L (ref 3.5–5.1)
Sodium: 133 mmol/L — ABNORMAL LOW (ref 135–145)
Total Bilirubin: 0.8 mg/dL (ref 0.3–1.2)
Total Protein: 6.1 g/dL — ABNORMAL LOW (ref 6.5–8.1)

## 2020-02-19 LAB — GLUCOSE, CAPILLARY
Glucose-Capillary: 157 mg/dL — ABNORMAL HIGH (ref 70–99)
Glucose-Capillary: 220 mg/dL — ABNORMAL HIGH (ref 70–99)
Glucose-Capillary: 138 mg/dL — ABNORMAL HIGH (ref 70–99)
Glucose-Capillary: 235 mg/dL — ABNORMAL HIGH (ref 70–99)

## 2020-02-19 LAB — MAGNESIUM: Magnesium: 2 mg/dL (ref 1.7–2.4)

## 2020-02-19 LAB — PHOSPHORUS: Phosphorus: 3.9 mg/dL (ref 2.5–4.6)

## 2020-02-19 LAB — OCCULT BLOOD X 1 CARD TO LAB, STOOL: Fecal Occult Bld: POSITIVE — AB

## 2020-02-19 MED ORDER — PROCHLORPERAZINE 25 MG RE SUPP: 12.5000 mg | Freq: Four times a day (QID) | RECTAL | Status: DC | PRN

## 2020-02-19 MED ORDER — BISACODYL 10 MG RE SUPP: 10.0000 mg | Freq: Every day | RECTAL | Status: DC | PRN

## 2020-02-19 MED ORDER — FLEET ENEMA 7-19 GM/118ML RE ENEM: 1.0000 | ENEMA | Freq: Once | RECTAL | Status: DC | PRN

## 2020-02-19 MED ORDER — ONDANSETRON 4 MG PO TBDP: 4.0000 mg | ORAL_TABLET | Freq: Three times a day (TID) | ORAL | 0 refills | Status: DC | PRN

## 2020-02-19 MED ORDER — BISACODYL 10 MG RE SUPP: 10.0000 mg | Freq: Every day | RECTAL | Status: DC

## 2020-02-19 MED ORDER — OXYCODONE HCL 10 MG PO TABS: 10.0000 mg | ORAL_TABLET | ORAL | 0 refills | Status: DC | PRN

## 2020-02-19 MED ORDER — SALINE SPRAY 0.65 % NA SOLN: 1.0000 | NASAL | Status: DC | PRN

## 2020-02-19 MED ORDER — MELATONIN 10 MG PO TABS: 10.0000 mg | ORAL_TABLET | Freq: Every evening | ORAL | 0 refills | Status: DC | PRN

## 2020-02-19 MED ORDER — ALUM & MAG HYDROXIDE-SIMETH 200-200-20 MG/5ML PO SUSP
30.0000 mL | ORAL | Status: DC | PRN
  Administered 2020-02-21: 30 mL via ORAL
  Filled 2020-02-19: qty 30

## 2020-02-19 MED ORDER — SALINE SPRAY 0.65 % NA SOLN: 1.0000 | NASAL | 0 refills | Status: DC | PRN

## 2020-02-19 MED ORDER — ATORVASTATIN CALCIUM 40 MG PO TABS
40.0000 mg | ORAL_TABLET | Freq: Every day | ORAL | Status: DC
  Administered 2020-02-19 – 2020-02-26 (×8): 40 mg via ORAL
  Filled 2020-02-19 (×9): qty 1

## 2020-02-19 MED ORDER — METOCLOPRAMIDE HCL 5 MG/ML IJ SOLN
5.0000 mg | Freq: Four times a day (QID) | INTRAMUSCULAR | Status: DC
  Administered 2020-02-19 – 2020-02-22 (×11): 5 mg via INTRAVENOUS
  Filled 2020-02-19 (×11): qty 2

## 2020-02-19 MED ORDER — GABAPENTIN 300 MG PO CAPS
300.0000 mg | ORAL_CAPSULE | Freq: Three times a day (TID) | ORAL | Status: DC
  Administered 2020-02-19 – 2020-02-27 (×23): 300 mg via ORAL
  Filled 2020-02-19 (×23): qty 1

## 2020-02-19 MED ORDER — MELATONIN 5 MG PO TABS
10.0000 mg | ORAL_TABLET | Freq: Every evening | ORAL | Status: DC | PRN
  Administered 2020-02-22 – 2020-02-23 (×2): 5 mg via ORAL
  Administered 2020-02-24 – 2020-02-26 (×3): 10 mg via ORAL
  Filled 2020-02-19 (×5): qty 2

## 2020-02-19 MED ORDER — FEXOFENADINE HCL 60 MG PO TABS
60.0000 mg | ORAL_TABLET | Freq: Two times a day (BID) | ORAL | Status: DC
  Administered 2020-02-19 – 2020-02-27 (×16): 60 mg via ORAL
  Filled 2020-02-19 (×17): qty 1

## 2020-02-19 MED ORDER — LIDOCAINE HCL URETHRAL/MUCOSAL 2 % EX GEL: CUTANEOUS | Status: DC | PRN

## 2020-02-19 MED ORDER — ACETAMINOPHEN 325 MG PO TABS
325.0000 mg | ORAL_TABLET | ORAL | Status: DC | PRN
  Administered 2020-02-27: 650 mg via ORAL
  Filled 2020-02-19: qty 2

## 2020-02-19 MED ORDER — LATANOPROST 0.005 % OP SOLN
1.0000 [drp] | Freq: Every day | OPHTHALMIC | Status: DC
  Administered 2020-02-19 – 2020-02-26 (×8): 1 [drp] via OPHTHALMIC

## 2020-02-19 MED ORDER — SIMETHICONE 80 MG PO CHEW: 40.0000 mg | CHEWABLE_TABLET | Freq: Four times a day (QID) | ORAL | 0 refills | Status: DC | PRN

## 2020-02-19 MED ORDER — TAMSULOSIN HCL 0.4 MG PO CAPS: 0.4000 mg | ORAL_CAPSULE | Freq: Every day | ORAL | Status: DC

## 2020-02-19 MED ORDER — BISACODYL 10 MG RE SUPP: 10.0000 mg | Freq: Two times a day (BID) | RECTAL | Status: DC

## 2020-02-19 MED ORDER — TRAZODONE HCL 50 MG PO TABS
25.0000 mg | ORAL_TABLET | Freq: Every evening | ORAL | Status: DC | PRN
  Administered 2020-02-19 – 2020-02-26 (×2): 50 mg via ORAL
  Filled 2020-02-19 (×3): qty 1

## 2020-02-19 MED ORDER — ENOXAPARIN SODIUM 40 MG/0.4ML ~~LOC~~ SOLN
40.0000 mg | SUBCUTANEOUS | Status: DC
  Administered 2020-02-19 – 2020-02-26 (×8): 40 mg via SUBCUTANEOUS
  Filled 2020-02-19 (×8): qty 0.4

## 2020-02-19 MED ORDER — MAGNESIUM OXIDE 400 (241.3 MG) MG PO TABS
400.0000 mg | ORAL_TABLET | Freq: Every day | ORAL | Status: DC
  Administered 2020-02-19 – 2020-02-27 (×9): 400 mg via ORAL
  Filled 2020-02-19 (×9): qty 1

## 2020-02-19 MED ORDER — PSYLLIUM 95 % PO PACK
1.0000 | PACK | Freq: Every day | ORAL | Status: DC
  Administered 2020-02-19 – 2020-02-20 (×2): 1 via ORAL
  Filled 2020-02-19 (×2): qty 1

## 2020-02-19 MED ORDER — INSULIN ASPART 100 UNIT/ML ~~LOC~~ SOLN
0.0000 [IU] | Freq: Every day | SUBCUTANEOUS | Status: DC
  Administered 2020-02-19 – 2020-02-22 (×3): 2 [IU] via SUBCUTANEOUS

## 2020-02-19 MED ORDER — TAMSULOSIN HCL 0.4 MG PO CAPS
0.8000 mg | ORAL_CAPSULE | Freq: Every day | ORAL | Status: DC
  Administered 2020-02-19 – 2020-02-26 (×8): 0.8 mg via ORAL
  Filled 2020-02-19 (×8): qty 2

## 2020-02-19 MED ORDER — METOPROLOL TARTRATE 50 MG PO TABS
50.0000 mg | ORAL_TABLET | Freq: Two times a day (BID) | ORAL | Status: DC
  Administered 2020-02-19 – 2020-02-27 (×16): 50 mg via ORAL
  Filled 2020-02-19 (×16): qty 1

## 2020-02-19 MED ORDER — GABAPENTIN 100 MG PO CAPS: 100.0000 mg | ORAL_CAPSULE | Freq: Three times a day (TID) | ORAL | Status: DC

## 2020-02-19 MED ORDER — METFORMIN HCL 500 MG PO TABS
500.0000 mg | ORAL_TABLET | Freq: Every day | ORAL | Status: DC
  Filled 2020-02-19: qty 1

## 2020-02-19 MED ORDER — PROCHLORPERAZINE MALEATE 5 MG PO TABS: 5.0000 mg | ORAL_TABLET | Freq: Four times a day (QID) | ORAL | Status: DC | PRN

## 2020-02-19 MED ORDER — GUAIFENESIN-DM 100-10 MG/5ML PO SYRP: 5.0000 mL | ORAL_SOLUTION | Freq: Four times a day (QID) | ORAL | Status: DC | PRN

## 2020-02-19 MED ORDER — AMLODIPINE BESYLATE 5 MG PO TABS
5.0000 mg | ORAL_TABLET | Freq: Every day | ORAL | Status: DC
  Administered 2020-02-20 – 2020-02-27 (×8): 5 mg via ORAL
  Filled 2020-02-19 (×8): qty 1

## 2020-02-19 MED ORDER — BISACODYL 10 MG RE SUPP
10.0000 mg | Freq: Two times a day (BID) | RECTAL | Status: DC
  Administered 2020-02-19 – 2020-02-20 (×2): 10 mg via RECTAL
  Filled 2020-02-19 (×2): qty 1

## 2020-02-19 MED ORDER — DIPHENHYDRAMINE HCL 12.5 MG/5ML PO ELIX
12.5000 mg | ORAL_SOLUTION | Freq: Four times a day (QID) | ORAL | Status: DC | PRN
  Administered 2020-02-26: 25 mg via ORAL
  Filled 2020-02-19: qty 10

## 2020-02-19 MED ORDER — PROSOURCE PLUS PO LIQD
30.0000 mL | Freq: Two times a day (BID) | ORAL | Status: DC
  Administered 2020-02-19 – 2020-02-27 (×16): 30 mL via ORAL
  Filled 2020-02-19 (×15): qty 30

## 2020-02-19 MED ORDER — SIMETHICONE 80 MG PO CHEW
80.0000 mg | CHEWABLE_TABLET | Freq: Four times a day (QID) | ORAL | Status: DC
  Administered 2020-02-19 – 2020-02-20 (×3): 80 mg via ORAL
  Filled 2020-02-19 (×3): qty 1

## 2020-02-19 MED ORDER — POLYETHYLENE GLYCOL 3350 17 G PO PACK: 17.0000 g | PACK | Freq: Every day | ORAL | Status: DC | PRN

## 2020-02-19 MED ORDER — POLYMYXIN B-TRIMETHOPRIM 10000-0.1 UNIT/ML-% OP SOLN
1.0000 [drp] | OPHTHALMIC | Status: DC
  Administered 2020-02-19 – 2020-02-26 (×39): 1 [drp] via OPHTHALMIC

## 2020-02-19 MED ORDER — POLYMYXIN B-TRIMETHOPRIM 10000-0.1 UNIT/ML-% OP SOLN: 1.0000 [drp] | OPHTHALMIC | 0 refills | Status: DC

## 2020-02-19 MED ORDER — INSULIN ASPART 100 UNIT/ML ~~LOC~~ SOLN
0.0000 [IU] | Freq: Three times a day (TID) | SUBCUTANEOUS | Status: DC
  Administered 2020-02-19: 1 [IU] via SUBCUTANEOUS
  Administered 2020-02-20: 2 [IU] via SUBCUTANEOUS
  Administered 2020-02-20: 3 [IU] via SUBCUTANEOUS
  Administered 2020-02-20 – 2020-02-21 (×2): 1 [IU] via SUBCUTANEOUS
  Administered 2020-02-21 – 2020-02-22 (×4): 2 [IU] via SUBCUTANEOUS
  Administered 2020-02-22: 1 [IU] via SUBCUTANEOUS
  Administered 2020-02-23 (×2): 2 [IU] via SUBCUTANEOUS
  Administered 2020-02-23: 3 [IU] via SUBCUTANEOUS
  Administered 2020-02-24: 1 [IU] via SUBCUTANEOUS
  Administered 2020-02-24 – 2020-02-25 (×3): 2 [IU] via SUBCUTANEOUS
  Administered 2020-02-25 – 2020-02-26 (×3): 1 [IU] via SUBCUTANEOUS

## 2020-02-19 MED ORDER — LIDOCAINE 5 % EX PTCH
1.0000 | MEDICATED_PATCH | CUTANEOUS | Status: DC
  Filled 2020-02-19 (×4): qty 1

## 2020-02-19 MED ORDER — ENSURE MAX PROTEIN PO LIQD
11.0000 [oz_av] | Freq: Two times a day (BID) | ORAL | Status: DC
  Administered 2020-02-19 – 2020-02-26 (×10): 11 [oz_av] via ORAL

## 2020-02-19 MED ORDER — LIDOCAINE 5 % EX PTCH: 1.0000 | MEDICATED_PATCH | CUTANEOUS | 0 refills | Status: DC

## 2020-02-19 MED ORDER — DOCUSATE SODIUM 100 MG PO CAPS: 100.0000 mg | ORAL_CAPSULE | Freq: Two times a day (BID) | ORAL | 0 refills | Status: DC

## 2020-02-19 MED ORDER — PROCHLORPERAZINE EDISYLATE 10 MG/2ML IJ SOLN: 5.0000 mg | Freq: Four times a day (QID) | INTRAMUSCULAR | Status: DC | PRN

## 2020-02-19 MED ORDER — OXYCODONE HCL 5 MG PO TABS
10.0000 mg | ORAL_TABLET | ORAL | Status: DC | PRN
  Administered 2020-02-19 – 2020-02-20 (×3): 10 mg via ORAL
  Filled 2020-02-19 (×3): qty 2

## 2020-02-19 NOTE — Discharge Summary (Signed)
Physician Discharge Summary  Jon Townsend LDJ:[id number] DOB: 08/29/1955 DOA: 02/03/2020  PCP: [name], DO  Admit date: 02/03/2020 Discharge date: 02/19/2020  Admitted From: Home Disposition: CIR  Recommendations for Outpatient Follow-up:  1. Follow up with PCP in 1-2 weeks 2. Follow up with Neurology within 1-2 weeks 3. Please obtain CMP/CBC, Mag, Phos in one week 4. Please follow up on the following pending results:  Home Health: No Equipment/Devices: None   Discharge Condition: Stable CODE STATUS: FULL CODE Diet recommendation: Heart Healthy/Carb Modified with 1500 mL Fluid Restriction  Brief/Interim Summary: The patient is a 65 year old obese Caucasian male with a past medical history significant for but not limited to hypertension, hyperlipidemia, diabetes mellitus type 2 who had a Covid infection in March 2021 and was given antibiotics following which symptoms resolved.  Having some upper respiratory tract symptoms for the last month for which she received prednisone and antibiotics last week and again received prednisone.  He started experiencing numbness and tingling of his upper extremities on Saturday, January 31, 2018 with slowly progressed proximally and started involving his lower extremities.  At that point he came to the ED and was evaluated by the neurologist and was recommended for outpatient work-up and discharged home.  As his symptoms progressed and worsened with weakness of the lower extremity he decided to come back to the ED and he denied any back pain, fevers or chills or any incontinence of urine or bowel.  He has been diagnosed with Guillain-Barr syndrome and has been given IVIG.  His diarrhea is improved and he has recommended for CIR but currently no beds available and currently was on IV Dilaudid which has not been stopped.  He is medically stable and improved and today has bed availability for CIR.  Discharge Diagnoses:  Principal Problem:    Polyneuropathy Active Problems:   Diabetes mellitus type 2 in obese (HCC)   Weakness   Essential hypertension   COVID-19 virus infection  Guillain-Barr syndrome, polyneuropathy -Neurology consulted, he is status post 5 days of IVIG. Much improved,  -PT recommended CIR, placement pending, insurance authorization came through but no beds available, hopefully within the next 24 hours as apparently he is first on the list -Discontinue IV Pain Medications as it is a barrier to CIR; C/w po Oxycodone 10 mg q4hprn  -Gabapentin 100 mg po BID to be increased to TID -Further Care per CIR   Severe constipation, abdominal pain, blood in the stool -This is probably triggered by severe constipation in the setting of prolonged narcotic use. Patient was placed on aggressive bowel regimen with excellent results however started having abdominal cramping and traces of blood in the stool -Abdominal x-ray showed concern for ileus. CT scan of the abdomen and pelvis concerning for colitis likely in the setting of severe constipation. He has been having traces of blood with his stools, he told Dr. Cruzita Lederer that he has had dose at home when he was constipated. Hemoglobin stable. No frank bleeding. -He has had significant GI lossesgiven diarrhea, was feeling weak yesterday, received IV fluids and feels stronger today -Now off of IVF -C/w Bowel regimen with Docusate 100 mg pO BID,   Acute Urinary Retention -Possibly due to increased intra-abdominal pressure in the setting of severe constipation, narcotic use, immobilization, has a Foley catheter in place. Do voiding trial in 2 to 3 days once more mobile and can be done at CIR  -C/w Tamsulosin 0.4 mg po Daily   Leukocytosis, Resolved  -Stable, urinalysis  without evidence of infection, improving -WBC went from from 14.1 -> 11.1 -> 8.9 -Continue to Monitor and Trend -Repeat CBC in the AM   Low back pain -Continue oxycodone 10 mg q4hprnn -IV pain  medications with Dilaudid stopped  -C/w Gabapentin (increased dose to 100 TID on 2/9) and Lidocaine Patch  COVID-19 infection -Asymptomatic, s/p 10 days of isolation, removed 2/4 -Not coughing   DM2 -Continue Sensitive Novolog Sliding Scale AC -CBG's ranging from 153-251  Hypertension -Continue Amlodipine 5 mg po Daily and Metorprolol 50 mg po BID  Hyperlipidemia -Continue Atorvastatin 40 mg qHS  Hyponatremia -Mild at 129 and improved to 133; Likely in the setting of Dehydration now -Continue IVF as above -Continue to Monitor and Trend -Repeat CMP in the AM   Obesity -Complicates overall prognosis and care -Estimated body mass index is 32.98 kg/m as calculated from the following:   Height as of this encounter: $RemoveBeforeD'6\' 1"'[id number]$  (1.854 m).   Weight as of this encounter: 113.4 kg. -Weight Loss and Dietary Counseling given   Discharge Instructions  Discharge Instructions    Call MD for:  difficulty breathing, headache or visual disturbances   Complete by: As directed    Call MD for:  extreme fatigue   Complete by: As directed    Call MD for:  hives   Complete by: As directed    Call MD for:  persistant dizziness or light-headedness   Complete by: As directed    Call MD for:  persistant nausea and vomiting   Complete by: As directed    Call MD for:  redness, tenderness, or signs of infection (pain, swelling, redness, odor or green/yellow discharge around incision site)   Complete by: As directed    Call MD for:  severe uncontrolled pain   Complete by: As directed    Call MD for:  temperature >100.4   Complete by: As directed    Diet - low sodium heart healthy   Complete by: As directed    Diet Carb Modified   Complete by: As directed    Discharge instructions   Complete by: As directed    You were cared for by a hospitalist during your hospital stay. If you have any questions about your discharge medications or the care you received while you were in the hospital after  you are discharged, you can call the unit and ask to speak with the hospitalist on call if the hospitalist that took care of you is not available. Once you are discharged, your primary care physician will handle any further medical issues. Please note that NO REFILLS for any discharge medications will be authorized once you are discharged, as it is imperative that you return to your primary care physician (or establish a relationship with a primary care physician if you do not have one) for your aftercare needs so that they can reassess your need for medications and monitor your lab values.  Follow up with PCP and Neurology within 1-2 weeks. Take all medications as prescribed. If symptoms change or worsen please return to the ED for evaluation   Increase activity slowly   Complete by: As directed      Allergies as of 02/19/2020   No Known Allergies     Medication List    STOP taking these medications   ibuprofen 200 MG tablet Commonly known as: ADVIL   meloxicam 7.5 MG tablet Commonly known as: Mobic   Vitamin D2 50 MCG (2000 UT) Tabs  TAKE these medications   amLODipine 5 MG tablet Commonly known as: NORVASC Take 1 tablet (5 mg total) by mouth daily.   atorvastatin 40 MG tablet Commonly known as: LIPITOR Take 1 tablet (40 mg total) by mouth daily. What changed: when to take this   CoQ10 100 MG Caps Take 100 mg by mouth in the morning.   docusate sodium 100 MG capsule Commonly known as: COLACE Take 1 capsule (100 mg total) by mouth 2 (two) times daily.   fexofenadine 180 MG tablet Commonly known as: ALLEGRA Take 180 mg by mouth in the morning.   gabapentin 100 MG capsule Commonly known as: NEURONTIN Take 1 capsule (100 mg total) by mouth 3 (three) times daily.   glucose blood test strip Commonly known as: OneTouch Verio Use as once daily to check blood sugar.   Krill Oil 1000 MG Caps Take 1,000 mg by mouth daily.   latanoprost 0.005 % ophthalmic  solution Commonly known as: XALATAN Place 1 drop into both eyes at bedtime.   lidocaine 5 % Commonly known as: LIDODERM Place 1 patch onto the skin daily. Remove & Discard patch within 12 hours or as directed by MD Start taking on: February 20, 2020   Melatonin 10 MG Tabs Take 10 mg by mouth at bedtime as needed (sleep).   metFORMIN 500 MG 24 hr tablet Commonly known as: GLUCOPHAGE-XR Take 2 tablets (1,000 mg total) by mouth daily with breakfast.   metoprolol tartrate 50 MG tablet Commonly known as: LOPRESSOR Take 1 tablet (50 mg total) by mouth 2 (two) times daily. What changed: when to take this   ondansetron 4 MG disintegrating tablet Commonly known as: ZOFRAN-ODT Take 1 tablet (4 mg total) by mouth every 8 (eight) hours as needed for nausea or vomiting.   onetouch ultrasoft lancets Use daily to check blood sugar.   OneTouch Verio w/Device Kit Use once daily to check blood sugar   Oxycodone HCl 10 MG Tabs Take 1 tablet (10 mg total) by mouth every 4 (four) hours as needed for severe pain.   simethicone 80 MG chewable tablet Commonly known as: MYLICON Chew 0.5 tablets (40 mg total) by mouth every 6 (six) hours as needed for flatulence.   sodium chloride 0.65 % Soln nasal spray Commonly known as: OCEAN Place 1 spray into both nostrils as needed for congestion.   tamsulosin 0.4 MG Caps capsule Commonly known as: FLOMAX Take 1 capsule (0.4 mg total) by mouth daily.   trimethoprim-polymyxin b ophthalmic solution Commonly known as: POLYTRIM Place 1 drop into the left eye every 4 (four) hours.   vitamin C 500 MG tablet Commonly known as: ASCORBIC ACID Take 500-1,000 mg by mouth daily.            Durable Medical Equipment  (From admission, onward)         Start     Ordered   02/07/20 1206  For home use only DME Other see comment  Once       Comments: Harrel Lemon  Question:  Length of Need  Answer:  12 Months   02/07/20 1205   02/07/20 1200  For home use only  DME lightweight manual wheelchair with seat cushion  Once       Comments: Patient suffers from weakness and numbness which impairs their ability to perform daily activities like walking in the home.  A walker will not resolve  issue with performing activities of daily living. A lightweight wheelchair will allow patient to  safely perform daily activities. Patient is not able to propel themselves in the home using a standard weight wheelchair due to weakness. Patient can self propel in the lightweight wheelchair. Length of need lifetime. Accessories: elevating leg rests (ELRs), wheel locks, extensions and anti-tippers.   02/07/20 1205          Follow-up Information    [facility] Follow up.   Specialty: [place] Why: for home health services Contact information: [address] [place] [address] [phone]        [facility] Follow up.   Why: for wheelchair and hoyer to be delivered to the house Contact information: [place] [address] [phone]              No Known Allergies  Consultations:  Neurology  CIR  Procedures/Studies: CT Head Wo Contrast  Result Date: 02/02/2020 CLINICAL DATA:  Dizziness, nonspecific, lower extremity weakness, tingling. Additional history provided: Patient reports frequent falls over the past few days, feeling unsteady, headache. a EXAM: CT HEAD WITHOUT CONTRAST TECHNIQUE: Contiguous axial images were obtained from the base of the skull through the vertex without intravenous contrast. COMPARISON:  No pertinent prior exams available for comparison. FINDINGS: Brain: Cerebral volume is normal. There is no acute intracranial hemorrhage. No demarcated cortical infarct. No extra-axial fluid collection. No evidence of intracranial mass. No midline shift. Partially empty sella turcica. Vascular: No hyperdense vessel.  Atherosclerotic calcifications. Skull: Visualized  orbits show no acute finding. Sinuses/Orbits: Visualized orbits show no acute finding. Trace bilateral ethmoid and maxillary sinus mucosal thickening. Small bilateral maxillary sinus mucous retention cysts. IMPRESSION: No evidence of acute intracranial abnormality. Mild paranasal sinus disease as described. Electronically Signed   By: Kellie Simmering DO   On: 02/02/2020 08:39   MR BRAIN W WO CONTRAST  Result Date: 02/02/2020 CLINICAL DATA:  Acute stroke. Dizziness. Lower extremity weakness. Frequent falling. EXAM: MRI HEAD WITHOUT AND WITH CONTRAST TECHNIQUE: Multiplanar, multiecho pulse sequences of the brain and surrounding structures were obtained without and with intravenous contrast. CONTRAST:  89mL GADAVIST GADOBUTROL 1 MMOL/ML IV SOLN COMPARISON:  Head CT same day FINDINGS: Brain: The brain has a normal appearance without evidence of malformation, atrophy, old or acute small or large vessel infarction, mass lesion, hemorrhage, hydrocephalus or extra-axial collection. After contrast administration, no abnormal enhancement occurs. Vascular: Major vessels at the base of the brain show flow. Venous sinuses appear patent. Skull and upper cervical spine: Normal. Sinuses/Orbits: Clear/normal. Other: None significant. IMPRESSION: Normal examination. No abnormality seen to explain the clinical presentation. Electronically Signed   By: Nelson Chimes M.D.   On: 02/02/2020 17:44   MR CERVICAL SPINE W WO CONTRAST  Result Date: 02/02/2020 CLINICAL DATA:  Acute progressive myelopathy. EXAM: MRI CERVICAL SPINE WITHOUT AND WITH CONTRAST TECHNIQUE: Multiplanar and multiecho pulse sequences of the cervical spine, to include the craniocervical junction and cervicothoracic junction, were obtained without and with intravenous contrast. CONTRAST:  59mL GADAVIST GADOBUTROL 1 MMOL/ML IV SOLN, 79mL GADAVIST GADOBUTROL 1 MMOL/ML IV SOLN COMPARISON:  None. FINDINGS: Alignment: Normal Vertebrae: Normal Cord: Normal. No cord  compression. No focal cord lesion. The study does suffer from some motion degradation but no abnormality is suspected or documented. No abnormal contrast enhancement. Posterior Fossa, vertebral arteries, paraspinal tissues: Negative Disc levels: Normal from the foramen magnum through C3-4. C4-5: Mild uncovertebral hypertrophy.  No compressive stenosis. C5-6: Uncovertebral hypertrophy right more than left. Right foraminal narrowing that could affect  the right C6 nerve. C6-7: Disc bulge. No compressive narrowing of the canal or foramina. C7-T1: Minimal uncovertebral prominence.  No significant stenosis. IMPRESSION: 1. No focal cord lesion. 2. Degenerative spondylosis as outlined above. Right foraminal narrowing at C5-6 that could affect the right C6 nerve. No other compressive canal or foraminal stenosis. Electronically Signed   By: Nelson Chimes M.D.   On: 02/02/2020 17:47   MR THORACIC SPINE W WO CONTRAST  Result Date: 02/02/2020 CLINICAL DATA:  Acute or progressive myelopathy. EXAM: MRI THORACIC WITHOUT AND WITH CONTRAST TECHNIQUE: Multiplanar and multiecho pulse sequences of the thoracic spine were obtained without and with intravenous contrast. CONTRAST:  73mL GADAVIST GADOBUTROL 1 MMOL/ML IV SOLN COMPARISON:  None. FINDINGS: Alignment:  Normal Vertebrae: Normal.  No fracture or primary lesion. Cord: No cord compression or primary cord lesion. No abnormal T2 signal or contrast enhancement. Paraspinal and other soft tissues: Negative Disc levels: No significant disc level pathology. No disc herniation. No compressive stenosis of the canal. No significant facet arthropathy. No significant foraminal stenosis. Some degenerative T2 signal in the lower thoracic discs, but without evidence of enhancement or endplate changes to suggest infection. IMPRESSION: Negative examination. No evidence of cord compression or primary cord lesion. No compressive canal or foraminal stenosis. No evidence of inflammatory arthropathy.  Some degenerative T2 signal in the lower thoracic discs, but without evidence of enhancement or endplate changes to suggest infection. Electronically Signed   By: Nelson Chimes M.D.   On: 02/02/2020 17:49   MR Lumbar Spine W Wo Contrast  Result Date: 02/04/2020 CLINICAL DATA:  65 year old male with history of worsening tetraparesis and sensory disturbance in the context of repeat COVID infection, concern for Guillain-Barre. Status post multiple recent failed LP attempts. EXAM: MRI LUMBAR SPINE WITHOUT AND WITH CONTRAST TECHNIQUE: Multiplanar and multiecho pulse sequences of the lumbar spine were obtained without and with intravenous contrast. CONTRAST:  4mL GADAVIST GADOBUTROL 1 MMOL/ML IV SOLN COMPARISON:  Previous radiograph from 04/03/2016. FINDINGS: Segmentation: Standard. Lowest well-formed disc space labeled the L5-S1 level. Alignment: Mild accentuation of the normal lumbar lordosis. Trace stepwise retrolisthesis of L1 on L2 through L4 on L5, measuring up to 4 mm at the level of L2-3. Findings chronic and facet mediated. Vertebrae: Vertebral body height maintained without acute or chronic fracture. Bone marrow signal intensity within normal limits. Few scattered subcentimeter benign hemangiomata noted. No worrisome osseous lesions. No abnormal marrow edema or enhancement to suggest osteomyelitis discitis or septic arthritis. Conus medullaris and cauda equina: Conus extends to the L1 level. Diffuse smooth leptomeningeal and/or dural enhancement seen about the distal cord and conus medullaris, with extension about the distal thecal sac (series 12, image 10). While this finding could be reactive in nature related to recent LP attempts, a possible acute infectious or inflammatory process could also have this appearance. No significant intramedullary enhancement about the distal cord or conus. No abnormal thickening or enhancement about the nerve roots of the cauda equina. Paraspinal and other soft tissues:  Soft tissue edema with mild enhancement seen within the posterior right greater than left paraspinous musculature at the level of L3-L5, suspected be related to recent LP attempts (series 6, images 6, 12). No discrete collections or evidence for CSF leak. Paraspinous soft tissues demonstrate no other acute finding. Few scattered simple cyst noted within the visualized kidneys. Visualized visceral structures otherwise unremarkable. Disc levels: T11-12: T2 hyperintense signal intensity within the T11-12 interspace without significant disc bulge, likely degenerative. Mild facet hypertrophy. No significant  stenosis. T12-L1: Diffuse disc bulge with disc desiccation and intervertebral disc space narrowing. Mild facet hypertrophy. No significant spinal stenosis. Foramina remain patent. L1-2: Mild diffuse disc bulge with disc desiccation and intervertebral disc space narrowing. Mild facet and ligament flavum hypertrophy. No significant spinal stenosis. Foramina remain patent. L2-3: Retrolisthesis. Diffuse disc bulge with disc desiccation and intervertebral disc space narrowing. Disc bulging eccentric to the right. Reactive endplate change. Mild facet and ligament flavum hypertrophy. Resultant mild to moderate canal with right worse than left lateral recess stenosis. Mild right worse than left L2 foraminal narrowing. L3-4: Diffuse disc bulge with disc desiccation and intervertebral disc space narrowing. Reactive endplate change. Mild to moderate facet and ligament flavum hypertrophy. Resultant mild canal with mild to moderate bilateral lateral recess stenosis, slightly worse on the right. Moderate right with mild to moderate left L3 foraminal stenosis. L4-5: Degenerative intervertebral disc space narrowing with diffuse disc bulge and disc desiccation. Associated discogenic reactive endplate change with marginal endplate osteophytic spurring. Superimposed small central disc protrusion indents the ventral thecal sac (series 8,  image 27). Mild to moderate facet hypertrophy. Resultant moderate canal with moderate to severe bilateral subarticular stenosis. Moderate bilateral L4 foraminal stenosis. L5-S1: Degenerative intervertebral disc space narrowing with diffuse disc bulge and disc desiccation. Associated reactive endplate spurring. Mild to moderate left greater than right facet hypertrophy. No significant spinal stenosis. Moderate bilateral L5 foraminal stenosis. IMPRESSION: 1. Smooth leptomeningeal and/or dural enhancement about the distal cord and conus medullaris, with extension about the distal thecal sac. While this finding could be reactive in nature related to recent LP attempts, a possible acute infectious or inflammatory process could also have this appearance. Although several previous LP attempts have reportedly already been performed, a repeat attempt with CSF analysis suggested for further evaluation. 2. No other evidence for acute infection within the lumbar spine. 3. Mild soft tissue edema and enhancement within the posterior right greater than left paraspinous musculature at the level of L3-L5, most likely related to recent LP attempts. No discrete collections or evidence for CSF leak. 4. Multilevel degenerative spondylosis throughout the lumbar spine as detailed above. Resultant up to moderate spinal stenosis at the L2-3 through L4-5 levels as above. Associated moderate bilateral L3 through L5 foraminal narrowing. Electronically Signed   By: Jeannine Boga M.D.   On: 02/04/2020 01:34   CT ABDOMEN PELVIS W CONTRAST  Result Date: 02/14/2020 CLINICAL DATA:  Acute abdominal pain, nonlocalized EXAM: CT ABDOMEN AND PELVIS WITH CONTRAST TECHNIQUE: Multidetector CT imaging of the abdomen and pelvis was performed using the standard protocol following bolus administration of intravenous contrast. CONTRAST:  110mL OMNIPAQUE IOHEXOL 300 MG/ML  SOLN COMPARISON:  Same day abdominal radiograph. FINDINGS: Lower chest: Bibasilar  atelectasis. Normal size heart. No pericardial effusion. Coronary artery calcifications. Hepatobiliary: Diffuse hepatic steatosis. A few hepatic hypodensities the largest of which measures 1.5 cm in segment 4 B which are technically indeterminate but favored represent hepatic cyst. Gallbladder is unremarkable. No biliary ductal dilatation. Pancreas: Atrophy of the tail and body of the pancreas. No surrounding inflammatory changes. No pancreatic ductal dilatation. Spleen: Normal in size without focal abnormality. Adrenals/Urinary Tract: Unremarkable adrenal glands. No hydronephrosis. No nephrolithiasis. No suspicious enhancing lesions. No filling defect in the collecting systems or proximal ureters on delayed imaging. Bladder is decompressed otherwise unremarkable. Stomach/Bowel: Stomach is unremarkable. Normal positioning of the duodenum/ligament of Treitz. No suspicious small bowel wall thickening or 2 dilation. Small volume formed stool throughout the colon. There is some loss of distal  colonic architecture and subtle wall thickening which may be chronic or secondary to colitis. Vascular/Lymphatic: Aortic atherosclerosis. No enlarged abdominal or pelvic lymph nodes. Reproductive: Prostate is unremarkable. Other: No abdominopelvic ascites. Musculoskeletal: Diffuse idiopathic skeletal hyperostosis. Multilevel degenerative change of the lumbar spine including multiple Schmorl's nodes. No acute osseous abnormality. IMPRESSION: 1. Subtle distal colonic wall thickening with some loss of architecture which may be chronic or secondary to colitis. 2. Small volume formed stool throughout the colon. 3. Diffuse hepatic steatosis. 4. Aortic atherosclerosis. Aortic Atherosclerosis (ICD10-I70.0). Electronically Signed   By: Dahlia Bailiff MD   On: 02/14/2020 23:08   DG Fluoro Rm 1-60 Min  Result Date: 02/03/2020 CLINICAL DATA:  Weakness, leg weakness and numbness in the lower extremities and hands. History of COVID infection  in this 65 year old male EXAM: DIAGNOSTIC LUMBAR PUNCTURE UNDER FLUOROSCOPIC GUIDANCE FLUOROSCOPY TIME:  Fluoroscopy Time:  1 minutes 20 seconds Radiation Exposure Index (if provided by the fluoroscopic device): 11.6 mGy Number of Acquired Spot Images: 1 PROCEDURE: Informed consent was obtained from the patient prior to the procedure, including potential complications of headache, allergy, and pain. With the patient prone, the lower back was prepped with Betadine. 1% Lidocaine was used for local anesthesia. Lumbar puncture was attempted at the L4-5, L3-4 and L2-3 levels level using a 20, 5 inch gauge needle. Access was unsuccessful despite multiple attempts by 2 different radiologists. Attempts were made in both the oblique and prone position. Saved images show a needle adjacent to the L3-4 level. This was for leveling purposes only. Attempts were made to access the lucent area adjacent to spinous processes at this level on both sides of the spinous process, at the level below and the level above without success. Lateral image saved due to difficulty in accessing the spinal canal with 5 inch needle shows that with approximately 1-2 inches outside of the skin there was still a distance to travel to access the canal. Final set of images shows no significant change with pillow in place under the abdomen in the appearance of the spine. IMPRESSION: 1. Unsuccessful lumbar puncture attempt at multiple levels as outlined above. 2. With suggest MRI of the lumbar spine for further evaluation to assess both the nature of degenerative changes and the position of the conus for further attempts, potentially in a biplane room which would aid in needle localization and allow for positioning that might improve access to the spinal canal. These results were called by telephone at the time of interpretation on 02/03/2020 at 5:22 pm to provider Dr. Roslynn Amble, who verbally acknowledged these results. Electronically Signed   By: Zetta Bills M.D.   On: 02/03/2020 17:59   DG CHEST PORT 1 VIEW  Result Date: 02/03/2020 CLINICAL DATA:  COVID 19 positive EXAM: PORTABLE CHEST 1 VIEW COMPARISON:  None. FINDINGS: The heart size and mediastinal contours are within normal limits. Both lungs are clear. The visualized skeletal structures are unremarkable. IMPRESSION: No active disease. Electronically Signed   By: Randa Ngo M.D.   On: 02/03/2020 21:56   DG Chest Port 1 View  Result Date: 02/02/2020 CLINICAL DATA:  Cough. Denies shortness of breath or chest pain. History of diabetes hypertension EXAM: PORTABLE CHEST 1 VIEW COMPARISON:  None. FINDINGS: The heart size and mediastinal contours are within normal limits. Both lungs are clear. The visualized skeletal structures are unremarkable. IMPRESSION: No active disease. Electronically Signed   By: Dahlia Bailiff MD   On: 02/02/2020 08:16   DG Abd 2 Views  Result Date: 02/14/2020 CLINICAL DATA:  Pain EXAM: ABDOMEN - 2 VIEW COMPARISON:  July 03, 2020 FINDINGS: Gaseous distention of bowel without dilation. Air is visualized in the rectum. No free air. Mild degenerative changes of the lower lumbar spine. Visualized lung bases are unremarkable. IMPRESSION: Gaseous distention of bowel without dilation. Findings could reflect ileus in the appropriate clinical setting. Electronically Signed   By: Valentino Saxon MD   On: 02/14/2020 20:12    Subjective: Seen and examined at bedside and he was doing much better today. Denied any nausea or vomiting. States his numbness and tingling has been improving he did not have any last night but had some this morning. No other concerns or complaints at this time.   Discharge Exam: Vitals:   02/19/20 0308 02/19/20 0720  BP: 122/83 122/79  Pulse: 77 83  Resp: 16 18  Temp: 98 F (36.7 C) 98.1 F (36.7 C)  SpO2: 94% 95%   Vitals:   02/18/20 1934 02/18/20 2321 02/19/20 0308 02/19/20 0720  BP: (!) 142/72 117/70 122/83 122/79  Pulse: 98 78 77 83   Resp: $Remo'18 18 16 18  'KhgRy$ Temp: 98.3 F (36.8 C) 98 F (36.7 C) 98 F (36.7 C) 98.1 F (36.7 C)  TempSrc: Oral Oral Oral Oral  SpO2: 95% 94% 94% 95%  Weight:      Height:       General: Pt is alert, awake, not in acute distress Cardiovascular: RRR, S1/S2 +, no rubs, no gallops Respiratory: Slightly diminished bilaterally, no wheezing, no rhonchi; unlabored breathing Abdominal: Soft, NT, distended secondary to body habitus, bowel sounds + Extremities: Trace edema, no cyanosis  The results of significant diagnostics from this hospitalization (including imaging, microbiology, ancillary and laboratory) are listed below for reference.    Microbiology: No results found for this or any previous visit (from the past 240 hour(s)).   Labs: BNP (last 3 results) No results for input(s): BNP in the last 8760 hours. Basic Metabolic Panel: Recent Labs  Lab 02/15/20 0725 02/16/20 0219 02/17/20 0024 02/18/20 0342 02/19/20 0255  NA 124* 127* 129* 129* 133*  K 5.0 4.5 4.8 3.8 4.0  CL 93* 92* 94* 97* 96*  CO2 20* $Remov'25 24 23 27  'rRYgzo$ GLUCOSE 226* 177* 155* 152* 140*  BUN 23 25* 25* 16 14  CREATININE 0.76 0.96 0.89 0.70 0.83  CALCIUM 8.5* 8.4* 8.3* 8.0* 8.5*  MG  --   --   --   --  2.0  PHOS  --   --   --   --  3.9   Liver Function Tests: Recent Labs  Lab 02/15/20 0725 02/16/20 0219 02/19/20 0255  AST 43* 28 25  ALT 38 37 33  ALKPHOS 72 72 79  BILITOT 1.9* 1.3* 0.8  PROT 7.1 6.5 6.1*  ALBUMIN 2.5* 2.3* 2.1*   No results for input(s): LIPASE, AMYLASE in the last 168 hours. No results for input(s): AMMONIA in the last 168 hours. CBC: Recent Labs  Lab 02/15/20 0725 02/16/20 0219 02/17/20 0024 02/18/20 0342 02/19/20 0255  WBC 15.6* 16.9* 14.1* 11.1* 8.9  NEUTROABS  --   --   --   --  6.2  HGB 16.1 15.2 14.5 13.4 13.6  HCT 46.3 43.3 42.1 38.7* 38.9*  MCV 88.0 88.7 89.6 88.2 89.0  PLT 112* 100* 107* 125* 154   Cardiac Enzymes: No results for input(s): CKTOTAL, CKMB, CKMBINDEX,  TROPONINI in the last 168 hours. BNP: Invalid input(s): POCBNP CBG: Recent Labs  Lab  02/18/20 0605 02/18/20 1129 02/18/20 1614 02/18/20 2141 02/19/20 0608  GLUCAP 153* 251* 215* 250* 157*   D-Dimer No results for input(s): DDIMER in the last 72 hours. Hgb A1c No results for input(s): HGBA1C in the last 72 hours. Lipid Profile No results for input(s): CHOL, HDL, LDLCALC, TRIG, CHOLHDL, LDLDIRECT in the last 72 hours. Thyroid function studies No results for input(s): TSH, T4TOTAL, T3FREE, THYROIDAB in the last 72 hours.  Invalid input(s): FREET3 Anemia work up No results for input(s): VITAMINB12, FOLATE, FERRITIN, TIBC, IRON, RETICCTPCT in the last 72 hours. Urinalysis    Component Value Date/Time   COLORURINE AMBER (A) 02/15/2020 1511   APPEARANCEUR HAZY (A) 02/15/2020 1511   LABSPEC 1.032 (H) 02/15/2020 1511   PHURINE 5.0 02/15/2020 1511   GLUCOSEU >=500 (A) 02/15/2020 1511   HGBUR LARGE (A) 02/15/2020 1511   BILIRUBINUR NEGATIVE 02/15/2020 1511   KETONESUR NEGATIVE 02/15/2020 1511   PROTEINUR 30 (A) 02/15/2020 1511   NITRITE NEGATIVE 02/15/2020 1511   LEUKOCYTESUR NEGATIVE 02/15/2020 1511   Sepsis Labs Invalid input(s): PROCALCITONIN,  WBC,  LACTICIDVEN Microbiology No results found for this or any previous visit (from the past 240 hour(s)).  Time coordinating discharge: 35 minutes  SIGNED:  Kerney Elbe, DO [facility] 02/19/2020, 11:11 AM Pager is on [place]  If 7PM-7AM, please contact night-coverage [email or web address]

## 2020-02-19 NOTE — Progress Notes (Signed)
Physical Therapy Treatment Patient Details Name: Jon Townsend MRN: [id number] DOB: 19-May-1955 Today's Date: 02/19/2020    History of Present Illness 65 y.o. male with history of HTN, HLD, DM who has had Covid infection in March 2021 was given antibodies following which symptoms resolved has been having some upper respiratory tract symptoms last month. Patient started experiencing tingling and numbness of the upper extremities on 02/01/20 which slowly progressed proximally and also started involving the lower extremities. Admitted for polyneuropathy workup. Found to have Guillain-Barre.    PT Comments    Pt continuing to make progress with mobility as he was able to ambulate up to ~60 ft this date. In addition, he only required physical assist of +1 today and chair follow for safety as he fatigues quickly. Unfortunately, pt had bowel incontinence coming to stand with 2nd gait attempt resulting in him sitting back down and being returned to room to be cleaned up. Will continue to follow acutely. Current recommendations remain appropriate.   Follow Up Recommendations  CIR     Equipment Recommendations  Rolling walker with 5" wheels;Wheelchair (measurements PT);3in1 (PT)    Recommendations for Other Services       Precautions / Restrictions Precautions Precautions: Fall Precaution Comments: off COVID precautions as of 02/13/20 Restrictions Weight Bearing Restrictions: No    Mobility  Bed Mobility Overal bed mobility: Needs Assistance Bed Mobility: Supine to Sit     Supine to sit: Min guard;HOB elevated     General bed mobility comments: Heavy use of rail to get to EOB with increased time.    Transfers Overall transfer level: Needs assistance Equipment used: Rolling walker (2 wheeled) Transfers: Sit to/from Stand Sit to Stand: Min assist         General transfer comment: Min A of 1 to stand from EOB 1x and chair 2x with cues for hand placement as pt pulling up on RW. Pt  would follow direction for one hand on current sitting surface to push up from and other on RW as he had difficulty transferring his hands to the RW from current sitting surface. rocking to gain momentum.  Ambulation/Gait Ambulation/Gait assistance: Min assist;+2 safety/equipment Gait Distance (Feet): 60 Feet Assistive device: Rolling walker (2 wheeled) Gait Pattern/deviations: Step-through pattern;Trunk flexed;Decreased step length - right;Decreased step length - left;Decreased stride length;Narrow base of support Gait velocity: reduced Gait velocity interpretation: <1.31 ft/sec, indicative of household ambulator General Gait Details: Slow, unsteady gait with bil knee instability. Heavy WB through bil UEs. Cued pt to increase bil step length and feet clearance, mod success. Pt cued to widen stance for improved stability but poor success as he would then hit his toes on the RW, thus cued to maintain safety and avoid toe bumping instead. No overt LOB, minA for steadying and chair follow.   Stairs             Wheelchair Mobility    Modified Rankin (Stroke Patients Only)       Balance Overall balance assessment: Needs assistance Sitting-balance support: Feet supported;No upper extremity supported Sitting balance-Leahy Scale: Fair Sitting balance - Comments: supervision for safety.   Standing balance support: Bilateral upper extremity supported Standing balance-Leahy Scale: Poor Standing balance comment: Bil UE support on RW for stability.                            Cognition Arousal/Alertness: Awake/alert Behavior During Therapy: WFL for tasks assessed/performed Overall Cognitive Status: Within Functional  Limits for tasks assessed                                 General Comments: Pt pleasant and motivated to participate and improve. Pt unable to detect start of bowel incontinence though.      Exercises      General Comments General comments  (skin integrity, edema, etc.): Bowel incontinence during 2nd stand      Pertinent Vitals/Pain Pain Assessment: Faces Faces Pain Scale: Hurts little more Pain Location: back Pain Descriptors / Indicators: Guarding Pain Intervention(s): Monitored during session;Limited activity within patient's tolerance;Repositioned;Patient requesting pain meds-RN notified    Home Living                      Prior Function            PT Goals (current goals can now be found in the care plan section) Acute Rehab PT Goals Patient Stated Goal: to return to independent mobility PT Goal Formulation: With patient Time For Goal Achievement: 02/19/20 Potential to Achieve Goals: Good Progress towards PT goals: Progressing toward goals    Frequency    Min 4X/week      PT Plan Current plan remains appropriate    Co-evaluation              AM-PAC PT "6 Clicks" Mobility   Outcome Measure  Help needed turning from your back to your side while in a flat bed without using bedrails?: A Little Help needed moving from lying on your back to sitting on the side of a flat bed without using bedrails?: A Little Help needed moving to and from a bed to a chair (including a wheelchair)?: A Little Help needed standing up from a chair using your arms (e.g., wheelchair or bedside chair)?: A Little Help needed to walk in hospital room?: A Little Help needed climbing 3-5 steps with a railing? : A Lot 6 Click Score: 17    End of Session Equipment Utilized During Treatment: Gait belt Activity Tolerance: Patient tolerated treatment well Patient left: in chair;with call bell/phone within reach Nurse Communication: Mobility status PT Visit Diagnosis: Other abnormalities of gait and mobility (R26.89);Muscle weakness (generalized) (M62.81);Other symptoms and signs involving the nervous system (R29.898);Unsteadiness on feet (R26.81);Difficulty in walking, not elsewhere classified (R26.2);Pain Pain -  Right/Left:  (back) Pain - part of body:  (back)     Time: 4196-2229 PT Time Calculation (min) (ACUTE ONLY): 28 min  Charges:  $Gait Training: 8-22 mins $Therapeutic Activity: 8-22 mins                     Moishe Spice, PT, DPT Acute Rehabilitation Services  Pager: [phone] Office: [place] 02/19/2020, 6:20 PM

## 2020-02-19 NOTE — Plan of Care (Signed)

## 2020-02-19 NOTE — TOC Transition Note (Signed)
Transition of Care [facility]) - CM/SW Discharge Note   Patient Details  Name: Jon Townsend MRN: [id number] Date of Birth: 03-29-1955  [facility] of [facility] CM/SW Contact:  Pollie Friar, RN Phone Number: 02/19/2020, 12:06 PM   Clinical Narrative:    Pt discharging to [facility] today. No further needs per TOC.   Final next level of care: IP Rehab Facility Barriers to Discharge: No Barriers Identified   Patient Goals and CMS Choice Patient states their goals for this hospitalization and ongoing recovery are:: to go home CMS Medicare.gov Compare Post Acute Care list provided to:: Patient Choice offered to / list presented to : Patient  Discharge Placement                       Discharge Plan and Services   Discharge Planning Services: CM Consult Post Acute Care Choice: Home Health,Durable Medical Equipment          DME Arranged: Other see comment,Lightweight manual wheelchair with seat cushion DME Agency: [employer] Date DME Agency Contacted: 02/07/20 Time DME Agency Contacted: 1212 Representative spoke with at DME Agency: [place]: RN,PT,OT,Nurse's Aide [place]: [place] Date [place]: 02/07/20 Time [place]: 1212 Representative spoke with at [place]: [place] ([place]) Interventions     Readmission Risk Interventions No flowsheet data found.

## 2020-02-19 NOTE — Progress Notes (Signed)
Inpatient Rehabilitation Medication Review by a Pharmacist  A complete drug regimen review was completed for this patient to identify any potential clinically significant medication issues.  Clinically significant medication issues were identified:  yes   Type of Medication Issue Identified Description of Issue Urgent (address now) Non-Urgent (address on AM team rounds) Plan   Drug Interaction(s) (clinically significant)       Duplicate Therapy       Allergy       No Medication Administration End Date       Incorrect Dose       Additional Drug Therapy Needed  Per H&P, patient requesting home pain medicine prescribed by Dr. Erlinda Hong, which is meloxicam   Non-urgent Consider restarting meloxicam BID PRN per patient request, good renal function and was not on opiates PTA    Other  Home metformin XR 1000mg  restarted as 500mg  IR  Non-urgent Consider changing metformin to XL to reduce side effects like loose stools       For non-urgent medication issues to be resolved on team rounds tomorrow morning a [facility] Secure Chat Handoff was sent to: Algis Liming, PA     Time spent performing this drug regimen review (minutes):  [place], PharmD, [place], [facility] Clinical Pharmacist  Please check AMION for all [place] phone numbers After 10:00 PM, call [place]

## 2020-02-19 NOTE — Progress Notes (Signed)
Inpatient Rehabilitation  Patient information reviewed and entered into eRehab system by Meziah Blasingame M. Buelah Rennie, M.A., CCC/SLP, PPS Coordinator.  Information including medical coding, functional ability and quality indicators will be reviewed and updated through discharge.    

## 2020-02-19 NOTE — Progress Notes (Addendum)
Courtney Heys, MD  Physician  Physical Medicine and Rehabilitation  PMR Pre-admission     Signed  Date of Service:  02/08/2020  1:34 PM      Related encounter: ED to Hosp-Admission (Current) from 02/03/2020 in [facility]       Signed         PMR Admission Coordinator Pre-Admission Assessment   Patient: Jon Townsend is an 65 y.o., male MRN: [id number] DOB: Nov 09, 1955 Height: 6\' 1"  (185.4 cm) Weight: 113.4 kg   Insurance Information HMO:     PPO:      PCP:      IPA:      80/20:      OTHER:  PRIMARY: [employer] Medicare      Policy#:[id number]      Subscriber: Pt CM Name: Beckie Busing       Phone#:[phone]     Fax#: [phone] Pre-Cert#: [id number]  [place] provided initial approval (2/4-2/10) with [place] extending dates with approval for 7 days now beginning 02/19/20-02/25/20. Updates due 02/25/20.  Employer:  Benefits:  Phone #:  [phone]   Name:  Irene Shipper Date: 02/10/2020 - still active Deductible: does not have OOP Max: $4,500 ($0 met) CIR: $295/day co-pay with a max co-pay of $1,770/admission (6 days) SNF: 100% coverage Outpatient:  $35/visit co-pay; Home Health:  100% coverage,  0% co-insurance DME: 80% coverage; 20% co-insurance   SECONDARY: none       Policy#:      Phone#:    The "Data Collection Information Summary" for patients in Inpatient Rehabilitation Facilities with attached "Privacy Act [place] Records" was provided and verbally reviewed with: Patient   Emergency Contact Information         Contact Information     Name Relation Home Work Jon Daughter     [phone]    Jerel, Townsend [phone]   [phone]         Current Medical History  Patient Admitting Diagnosis: GBS, Polyneuropathy History of Present Illness:Jon Townsend is a 65 y.o. male with history of T2DM, HTN, BPH who presented to ED on 02/02/29 with 48 hours history of progressive weakness --BLE>BUE and paresthesias, numbness  bilateral hands/feet and inability to walk. He was found to be Covid + and reported having HA, URI symptoms and anorexia. Work up revealed areflexia with tetraplegia --attempts at LP unsuccessful and patient treated with IVIG X 5 doses for symptoms consistent with GBS in setting of Covid infection.   MRI spine showed degenerative spondylosis with foraminal narrowing C5/C6,  Multilevel degenerative spondylosis lumbar spine with moderate spinal stenosis L2/3- L3/4. LBP treated with local measures and recommendations to avoid muscle relaxers per neurology. Physical Medicine & Rehabilitation was consulted to assess candidacy for CIR given diffuse weakness and numbness.     Complete NIHSS TOTAL: 2   Patient's medical record from [facility]  has been reviewed by the rehabilitation admission coordinator and physician.   Past Medical History      Past Medical History:  Diagnosis Date  . Allergy    . BPH (benign prostatic hyperplasia)    . Diabetes mellitus type 2 in obese ([place])    . Glaucoma    . Hypertension        Family History   family history includes COPD in his sister; Cancer in his father; Diabetes in his sister and sister; Early death in his father; Heart disease in his sister.   Prior Rehab/Hospitalizations Has the patient had prior  rehab or hospitalizations prior to admission? No   Has the patient had major surgery during 100 days prior to admission? Yes              Current Medications   Current Facility-Administered Medications:  .  acetaminophen (TYLENOL) tablet 650 mg, 650 mg, Oral, Q6H PRN, 650 mg at 02/11/20 0330 **OR** acetaminophen (TYLENOL) suppository 650 mg, 650 mg, Rectal, Q6H PRN, Eduard Clos, MD .  amLODipine (NORVASC) tablet 5 mg, 5 mg, Oral, Daily, Eduard Clos, MD, 5 mg at 02/19/20 0854 .  atorvastatin (LIPITOR) tablet 40 mg, 40 mg, Oral, QHS, Eduard Clos, MD, 40 mg at 02/18/20 2141 .  Chlorhexidine Gluconate Cloth 2 % PADS 6 each,  6 each, Topical, Daily, Leatha Gilding, MD, 6 each at 02/19/20 [phone] .  docusate sodium (COLACE) capsule 100 mg, 100 mg, Oral, BID, Jerald Kief, MD, 100 mg at 02/15/20 2204 .  fexofenadine (ALLEGRA) tablet 60 mg, 60 mg, Oral, BID, Leatha Gilding, MD, 60 mg at 02/19/20 0859 .  gabapentin (NEURONTIN) capsule 100 mg, 100 mg, Oral, TID, Marguerita Merles [place], DO, 100 mg at 02/19/20 6776 .  insulin aspart (novoLOG) injection 0-9 Units, 0-9 Units, Subcutaneous, TID WC, Eduard Clos, MD, 2 Units at 02/19/20 [phone] .  latanoprost (XALATAN) 0.005 % ophthalmic solution 1 drop, 1 drop, Both Eyes, QHS, Jerald Kief, MD, 1 drop at 02/18/20 2144 .  lidocaine (LIDODERM) 5 % 1 patch, 1 patch, Transdermal, Q24H, Jerald Kief, MD, 1 patch at 02/06/20 1030 .  melatonin tablet 10 mg, 10 mg, Oral, QHS PRN, Shalhoub, Deno Lunger, MD, 10 mg at 02/18/20 2142 .  metoprolol tartrate (LOPRESSOR) tablet 50 mg, 50 mg, Oral, BID, Eduard Clos, MD, 50 mg at 02/19/20 0853 .  ondansetron (ZOFRAN-ODT) disintegrating tablet 4 mg, 4 mg, Oral, Q8H PRN, Leatha Gilding, MD, 4 mg at 02/18/20 1151 .  oxyCODONE (Oxy IR/ROXICODONE) immediate release tablet 10 mg, 10 mg, Oral, Q4H PRN, Leatha Gilding, MD, 10 mg at 02/19/20 0250 .  simethicone (MYLICON) chewable tablet 40 mg, 40 mg, Oral, Q6H PRN, John Giovanni, MD, 40 mg at 02/18/20 2142 .  sodium chloride (OCEAN) 0.65 % nasal spray 1 spray, 1 spray, Each Nare, PRN, Leatha Gilding, MD, 1 spray at 02/14/20 1410 .  tamsulosin (FLOMAX) capsule 0.4 mg, 0.4 mg, Oral, Daily, Eduard Clos, MD, 0.4 mg at 02/19/20 0853 .  trimethoprim-polymyxin b (POLYTRIM) ophthalmic solution 1 drop, 1 drop, Left Eye, Q4H, Leatha Gilding, MD, 1 drop at 02/19/20 0856   Patients Current Diet:     Diet Order                      Diet heart healthy/carb modified Room service appropriate? Yes; Fluid consistency: Thin; Fluid restriction: 1500 mL Fluid  Diet effective now                       Precautions / Restrictions Precautions Precautions: Fall Precaution Comments: off COVID precautions as of 02/13/20 Restrictions Weight Bearing Restrictions: No    Has the patient had 2 or more falls or a fall with injury in the past year? Yes   Prior Activity Level Community (5-7x/wk): Pt. acitve and working PTA   Prior Functional Level Self Care: Did the patient need help bathing, dressing, using the toilet or eating? Independent   Indoor Mobility: Did the patient need assistance with walking  from room to room (with or without device)? Independent   Stairs: Did the patient need assistance with internal or external stairs (with or without device)? Independent   Functional Cognition: Did the patient need help planning regular tasks such as shopping or remembering to take medications? Independent   Home Assistive Devices / Equipment Home Assistive Devices/Equipment: None Home Equipment: Walker - 2 wheels,Shower seat,Bedside commode   Prior Device Use: Indicate devices/aids used by the patient prior to current illness, exacerbation or injury? None of the above   Current Functional Level Cognition   Overall Cognitive Status: Within Functional Limits for tasks assessed Orientation Level: Oriented X4 Following Commands: Follows multi-step commands inconsistently,Follows multi-step commands with increased time General Comments: Pt pleasant and motivated to participate and improve.    Extremity Assessment (includes Sensation/Coordination)   Upper Extremity Assessment: RUE deficits/detail,LUE deficits/detail RUE Deficits / Details: Increased bimanual manipulation during grooming tasks. RUE Sensation: decreased light touch LUE Deficits / Details: similar to RUE however R shoulder strength weaker LUE Sensation: decreased light touch  Lower Extremity Assessment: Defer to PT evaluation RLE Deficits / Details: grossly 4-/5 RLE Sensation: decreased light  touch LLE Deficits / Details: grossly 4-/5 LLE Sensation: decreased light touch     ADLs   Overall ADL's : Needs assistance/impaired Eating/Feeding: Set up,Supervision/ safety Eating/Feeding Details (indicate cue type and reason): may benfit from built up tubing Grooming: Set up Grooming Details (indicate cue type and reason): Patient tolerated 3/3 grooming tasks in perched position in [place] at sink. Will attempt standing at sink with chair directly behind patient at time of next session. Upper Body Bathing: Set up,Sitting Lower Body Bathing: Moderate assistance,Sitting/lateral leans Lower Body Bathing Details (indicate cue type and reason): pt able to reach down to LB from sitting EOB but requries MOD A for cleanliness Upper Body Dressing : Set up,Sitting Lower Body Dressing: Moderate assistance,Sitting/lateral leans Lower Body Dressing Details (indicate cue type and reason): pt able to reach to BLEs from EOB but requires MOD A d/t fatigur from reaching out of BOS Toilet Transfer: Total assistance,Minimal assistance Toilet Transfer Details (indicate cue type and reason): simualated via pivot transfer to recliner with stedy, MIN A to stand to stedy with use of momentum and total A for the pivot with use of stedy Functional mobility during ADLs: Minimal assistance (sit<>stand only from EOB with stedy) General ADL Comments: pt continues to present with generalized deconditioning, decreased activity tolerance and generalized weakness ( RUE>LUE) impacting pts ability to complete BADLs independently     Mobility   Overal bed mobility: Needs Assistance Bed Mobility: Supine to Sit Rolling: Supervision Supine to sit: Min guard,HOB elevated Sit to supine: Min guard,HOB elevated General bed mobility comments: Heavy use of rail to get to EOB with increased time.     Transfers   Overall transfer level: Needs assistance Equipment used: Rolling walker (2 wheeled) Transfer via Lift Equipment:  Stedy Transfers: Sit to/from Stand Sit to Stand: [employer] physical assistance,+2 safety/equipment Stand pivot transfers: Mod assist Squat pivot transfers: Max assist General transfer comment: Min A of 2 to stand from EOB 1x and chair 1x with cues for hand placement as pt pulling up on RW. Pt would follow direction for one hand on current sitting surface to push up from and other on RW as he had difficulty transferring his hands to the RW on one failed attempt to come to stand.     Ambulation / Gait / Stairs / Wheelchair Mobility   Ambulation/Gait Ambulation/Gait  assistance: Min assist,+2 safety/equipment Gait Distance (Feet): 50 Feet (x2 bouts of ~50 ft > ~40 ft) Assistive device: Rolling walker (2 wheeled) Gait Pattern/deviations: Step-through pattern,Trunk flexed,Decreased step length - right,Decreased step length - left,Decreased stride length General Gait Details: Slow, unsteady gait with bil knee and ankle instability laterally; heavy WB through BUEs. Cued pt to perform heel-to-toe sequence with steps, with mod success. Pt remains within RW throughout. No overt LOB, minA for steadying and chair follow. Gait velocity: reduced Gait velocity interpretation: <1.31 ft/sec, indicative of household ambulator     Posture / Balance Dynamic Sitting Balance Sitting balance - Comments: supervision for safety. Balance Overall balance assessment: Needs assistance Sitting-balance support: Feet supported,No upper extremity supported Sitting balance-Leahy Scale: Fair Sitting balance - Comments: supervision for safety. Postural control: Posterior lean Standing balance support: Bilateral upper extremity supported Standing balance-Leahy Scale: Poor Standing balance comment: Bil UE support on RW for stability.     Special needs/care consideration Post-covid, will need private room    Previous Home Environment (from acute therapy documentation) Living Arrangements: Spouse/significant  other Available Help at Discharge: Family,Available 24 hours/day Type of Home: [place]: Two level,Able to live on main level with bedroom/bathroom Home Access: Stairs to enter Entrance Stairs-Rails: None Entrance Stairs-Number of Steps: 2 Bathroom Shower/Tub: Multimedia programmer: Standard Bathroom Accessibility: Yes How Accessible: Accessible via walker,Accessible via wheelchair [place]: No Additional Comments: spouse with recent hip fx, still utilizing cane to ambulate, cannot provide physical assistance   Discharge Living Setting Plans for Discharge Living Setting: Patient's home Type of Home at Discharge: House Discharge Home Layout: Two level,Able to live on main level with bedroom/bathroom Discharge Home Access: Stairs to enter Entrance Stairs-Rails: None Entrance Stairs-Number of Steps: 2 Discharge Bathroom Shower/Tub: Walk-in shower Discharge Bathroom Toilet: Standard Discharge Bathroom Accessibility: Yes How Accessible: Accessible via walker,Accessible via wheelchair Does the patient have any problems obtaining your medications?: No   Social/Family/Support Systems Patient Roles: Spouse Contact Information: [phone] Anticipated Caregiver: Mylz Yuan (Pt.'s wife was admitted to CIR 3 months ago following hip fracture and ambulates with a cane. She can provide mostly supervision but can provide min A with some ADLs.  Anticipated Caregiver's Contact Information: [phone] Ability/Limitations of Caregiver: Can provide supervision to Min A Caregiver Availability: 24/7 Discharge Plan Discussed with Primary Caregiver: Yes Is Caregiver In Agreement with Plan?: Yes Does Caregiver/Family have Issues with Lodging/Transportation while Pt is in Rehab?: Yes   Goals Patient/Family Goal for Rehab: PT/OT Supervision to Min A Expected length of stay: 14-21 days Pt/Family Agrees to Admission and willing to participate: Yes Program Orientation  Provided & Reviewed with Pt/Caregiver Including Roles  & Responsibilities: Yes  Barriers to Discharge: Decreased caregiver support   Decrease burden of Care through IP rehab admission: Specialzed equipment needs, Decrease number of caregivers, Bowel and bladder program and Patient/family education   Possible need for SNF placement upon discharge: not anticipated   Patient Condition: This patient's medical and functional status has changed since the consult dated: 02/09/2020 in which the Rehabilitation Physician determined and documented that the patient's condition is appropriate for intensive rehabilitative care in an inpatient rehabilitation facility. See "History of Present Illness" (above) for medical update. Functional changes are: improvement in trasnfers from Mod A +2 with the stedy and no gait to Min A  + 2 with RW and Min A +2 for gait 50 feet. Patient's medical and functional status update has been discussed with the Rehabilitation physician and patient remains appropriate  for inpatient rehabilitation. Will admit to inpatient rehab today, 02/19/20   Preadmission Screen Completed By:  Genella Mech, 02/17/2020 3:47 PM with day of admit updates by: Raechel Ache, OTR/L at 02/19/20 at 10:42AM. ______________________________________________________________________   Discussed status with Dr. Dagoberto Ligas  on 02/19/20 at 10:41AM and received approval for admission today.   Admission Coordinator:  Genella Mech, CCC-SLP, with day of admit updates by Raechel Ache, OTR/L at time 10:41AM/Date 02/19/20    Assessment/Plan: Diagnosis: 1. Does the need for close, 24 hr/day Medical supervision in concert with the patient's rehab needs make it unreasonable for this patient to be served in a less intensive setting? Yes 2. Co-Morbidities requiring supervision/potential complications: HTN, BPH, DM2, GBS with generalized LE>UE weakness 3. Due to bladder management, bowel management, safety, skin/wound care, disease  management and medication administration, does the patient require 24 hr/day rehab nursing? Yes 4. Does the patient require coordinated care of a physician, rehab nurse, PT, OT, and SLP to address physical and functional deficits in the context of the above medical diagnosis(es)? Yes Addressing deficits in the following areas: balance, endurance, locomotion, strength, transferring, bathing, dressing, feeding, grooming and toileting 5. Can the patient actively participate in an intensive therapy program of at least 3 hrs of therapy 5 days a week? Yes 6. The potential for patient to make measurable gains while on inpatient rehab is good 7. Anticipated functional outcomes upon discharge from inpatient rehab: supervision and min assist PT, supervision and min assist OT, n/a SLP 8. Estimated rehab length of stay to reach the above functional goals is: 14-21 days 9. Anticipated discharge destination: Home 10. Overall Rehab/Functional Prognosis: good     MD Signature:            Revision History                                                           Note Details  Jan Fireman, MD File Time 02/19/2020 10:50 AM  Author Type Physician Status Signed  Last Editor Courtney Heys, MD Service Physical Medicine and Rehabilitation

## 2020-02-19 NOTE — Progress Notes (Signed)
Izora Ribas, MD  Physician  Physical Medicine and Rehabilitation  Consult Note     Signed  Date of Service:  02/09/2020  9:02 AM      Related encounter: ED to Hosp-Admission (Current) from 02/03/2020 in [place] [facility]       Signed      Expand All Collapse All     Show:Clear all $RemoveBefore'[x]'ajTZEHwaFrptx$ Manual$R'[x]'CW$ Templa'[]'$ Copied  Added by: $RemoveB'[x]'jvTutkTK$ Love, Ivan Anchors, PA-C$RemoveBeforeDEI'[x]'ECxDcKrXKjvcbxFs$ Raulkar, Clide Deutscher, MD   '[]'$ Hover for details           Physical Medicine and Rehabilitation Consult   Reason for Consult: GBS with functional decline.  Referring Physician: Dr.Chiu     HPI: Jon Townsend is a 65 y.o. male with history of T2DM, HTN, BPH who presented to ED on 02/02/29 with 48 hours history of progressive weakness --BLE>BUE and paresthesias, numbness bilateral hands/feet and inability to walk. He was found to be Covid + and reported having HA, URI symptoms and anorexia. Work up revealed areflexia with tetraplegia --attempts at LP unsuccessful and patient treated with IVIG X 5 doses for symptoms consistent with GBS in setting of Covid infection.   MRI spine showed degenerative spondylosis with foraminal narrowing C5/C6,  Multilevel degenerative spondylosis lumbar spine with moderate spinal stenosis L2/3- L3/4. LBP treated with local measures and recommendations to avoid muscle relaxers per neurology. Physical Medicine & Rehabilitation was consulted to assess candidacy for CIR given diffuse weakness and numbness.      Review of Systems  Constitutional: Negative for chills and fever.  HENT: Negative for hearing loss and tinnitus.   Eyes: Positive for double vision (in the right eye today). Negative for pain.  Respiratory: Negative for cough, shortness of breath and wheezing.   Cardiovascular: Negative for chest pain and palpitations.  Gastrointestinal: Positive for constipation (only once since admission). Negative for heartburn and nausea.  Genitourinary: Negative for dysuria and urgency.   Musculoskeletal: Positive for back pain.  Neurological: Positive for sensory change and focal weakness.          Past Medical History:  Diagnosis Date  . Allergy    . BPH (benign prostatic hyperplasia)    . Diabetes mellitus type 2 in obese ([place])    . Glaucoma    . Hypertension             Past Surgical History:  Procedure Laterality Date  . Broken finger      . TONSILLECTOMY               Family History  Problem Relation Age of Onset  . Cancer Father    . Early death Father    . Diabetes Sister    . Heart disease Sister    . COPD Sister    . Diabetes Sister    . Colon cancer Neg Hx    . Esophageal cancer Neg Hx    . Stomach cancer Neg Hx    . Rectal cancer Neg Hx        Social History:  Married. Retired from [employer] a week PTA. He reports that he has quit smoking--40+years ago. Marland Kitchen He has never used smokeless tobacco. He reports that he does not drink alcohol and does not use drugs.    Allergies: No Known Allergies            Medications Prior to Admission  Medication Sig Dispense Refill  . amLODipine (NORVASC) 5 MG tablet Take 1 tablet (5 mg total) by mouth daily. 90 tablet  1  . atorvastatin (LIPITOR) 40 MG tablet Take 1 tablet (40 mg total) by mouth daily. (Patient taking differently: Take 40 mg by mouth at bedtime.) 90 tablet 3  . Coenzyme Q10 (COQ10) 100 MG CAPS Take 100 mg by mouth in the morning.      . Ergocalciferol (VITAMIN D2) 50 MCG (2000 UT) TABS Take 2,000 mg by mouth daily.      . fexofenadine (ALLEGRA) 180 MG tablet Take 180 mg by mouth in the morning.      Marland Kitchen ibuprofen (ADVIL) 200 MG tablet Take 400 mg by mouth every 6 (six) hours as needed for mild pain (or headaches).      Javier Docker Oil 1000 MG CAPS Take 1,000 mg by mouth daily.      . meloxicam (MOBIC) 7.5 MG tablet Take 1 tablet (7.5 mg total) by mouth 2 (two) times daily as needed for pain. 30 tablet 2  . metFORMIN (GLUCOPHAGE-XR) 500 MG 24 hr tablet Take 2 tablets (1,000 mg total) by mouth daily  with breakfast. 60 tablet 5  . metoprolol tartrate (LOPRESSOR) 50 MG tablet Take 1 tablet (50 mg total) by mouth 2 (two) times daily. (Patient taking differently: Take 50 mg by mouth in the morning and at bedtime.) 180 tablet 3  . tamsulosin (FLOMAX) 0.4 MG CAPS capsule Take 1 capsule (0.4 mg total) by mouth daily. 90 capsule 3  . vitamin C (ASCORBIC ACID) 500 MG tablet Take 500-1,000 mg by mouth daily.      . Blood Glucose Monitoring Suppl (ONETOUCH VERIO) w/Device KIT Use once daily to check blood sugar 1 kit 0  . glucose blood (ONETOUCH VERIO) test strip Use as once daily to check blood sugar. 100 each 3  . Lancets (ONETOUCH ULTRASOFT) lancets Use daily to check blood sugar. 100 each 3      Home: Home Living Family/patient expects to be discharged to:: Private residence Living Arrangements: Spouse/significant other Available Help at Discharge: Family,Available 24 hours/day Type of Home: House Home Access: Stairs to enter [employer] of Steps: 2 Entrance Stairs-Rails: None Home Layout: Two level,Able to live on main level with bedroom/bathroom Bathroom Shower/Tub: Multimedia programmer: Standard Bathroom Accessibility: Yes Home Equipment: Environmental consultant - 2 wheels,Shower seat,Bedside commode Additional Comments: spouse with recent hip fx, still utilizing cane to ambulate, cannot provide physical assistance  Functional History: Prior Function Level of Independence: Independent Comments: Just retired 1/14 Functional Status:  Mobility: Bed Mobility Overal bed mobility: Needs Assistance Bed Mobility: Supine to Sit Supine to sit: Min assist,HOB elevated General bed mobility comments: Seated EOB with PT upon entry. Transfers Overall transfer level: Needs assistance Equipment used: 1 person hand held assist Transfer via Lift Equipment: Stedy Transfers: Sit to/from Stand Sit to Stand: Mod assist,+2 physical assistance Stand pivot transfers: Max assist Squat pivot  transfers: Mod assist,Min assist,+2 physical assistance,+2 safety/equipment,From elevated surface General transfer comment: Mod A +2 for sit to stand x2 from EOB and Min A +2 from perched position in [place]. Cues for upright posture. Ambulation/Gait General Gait Details: Unable to do so safely this date   ADL: ADL Overall ADL's : Needs assistance/impaired Eating/Feeding: Set up,Supervision/ safety Eating/Feeding Details (indicate cue type and reason): may benfit from built up tubing Grooming: Set up,Sitting Upper Body Bathing: Set up,Sitting Lower Body Bathing: Moderate assistance,Sit to/from stand Upper Body Dressing : Set up,Sitting Lower Body Dressing: Moderate assistance,Sit to/from stand Functional mobility during ADLs: Maximal assistance   Cognition: Cognition Overall Cognitive Status: Within Functional Limits  for tasks assessed Orientation Level: Oriented X4 Cognition Arousal/Alertness: Awake/alert Behavior During Therapy: WFL for tasks assessed/performed Overall Cognitive Status: Within Functional Limits for tasks assessed     Blood pressure (!) 158/88, pulse 78, temperature 97.7 F (36.5 C), temperature source Oral, resp. rate 18, height $RemoveBe'6\' 1"'nmyLGSbww$  (1.854 m), weight 113.4 kg, SpO2 97 %. Physical Exam   General: Alert and oriented x 3, No apparent distress HEENT: Head is normocephalic, atraumatic, PERRLA, EOMI, sclera anicteric, oral mucosa pink and moist, dentition intact, ext ear canals clear,  Neck: Supple without JVD or lymphadenopathy Heart: Reg rate and rhythm. No murmurs rubs or gallops Chest: CTA bilaterally without wheezes, rales, or rhonchi; no distress Abdomen: Soft, non-tender, There is distension.  Extremities: No clubbing, cyanosis, or edema. Pulses are 2+ Psych: Pt's affect is appropriate. Pt is cooperative Skin: Clean and intact without signs of breakdown Neuro: He is alert and oriented to person, place, and time. Mild right facial weakness without  dysarthria. Reporting right diplopia today. Diffuse weakness--sensation improving bilateral palms. 4/5 BUE, 3/5 BLE.      Lab Results Last 24 Hours       Results for orders placed or performed during the hospital encounter of 02/03/20 (from the past 24 hour(s))  Glucose, capillary     Status: Abnormal    Collection Time: 02/08/20 12:18 PM  Result Value Ref Range    Glucose-Capillary 206 (H) 70 - 99 mg/dL  Glucose, capillary     Status: Abnormal    Collection Time: 02/08/20  3:16 PM  Result Value Ref Range    Glucose-Capillary 241 (H) 70 - 99 mg/dL  Glucose, capillary     Status: Abnormal    Collection Time: 02/08/20  9:58 PM  Result Value Ref Range    Glucose-Capillary 226 (H) 70 - 99 mg/dL    Comment 1 Notify RN      Comment 2 Document in Chart    Glucose, capillary     Status: Abnormal    Collection Time: 02/09/20  6:16 AM  Result Value Ref Range    Glucose-Capillary 197 (H) 70 - 99 mg/dL    Comment 1 Notify RN      Comment 2 Document in Chart    Comprehensive metabolic panel     Status: Abnormal    Collection Time: 02/09/20  6:52 AM  Result Value Ref Range    Sodium 128 (L) 135 - 145 mmol/L    Potassium 3.9 3.5 - 5.1 mmol/L    Chloride 95 (L) 98 - 111 mmol/L    CO2 25 22 - 32 mmol/L    Glucose, Bld 205 (H) 70 - 99 mg/dL    BUN 26 (H) 8 - 23 mg/dL    Creatinine, Ser 0.76 0.61 - 1.24 mg/dL    Calcium 8.6 (L) 8.9 - 10.3 mg/dL    Total Protein 9.0 (H) 6.5 - 8.1 g/dL    Albumin 2.9 (L) 3.5 - 5.0 g/dL    AST 29 15 - 41 U/L    ALT 30 0 - 44 U/L    Alkaline Phosphatase 63 38 - 126 U/L    Total Bilirubin 1.4 (H) 0.3 - 1.2 mg/dL    GFR, Estimated >60 >60 mL/min    Anion gap 8 5 - 15      Imaging Results (Last 48 hours)  No results found.       Assessment/Plan: Diagnosis: Polyneuropathy 1. Does the need for close, 24 hr/day medical supervision in concert with the  patient's rehab needs make it unreasonable for this patient to be served in a less intensive setting?  Yes 2. Co-Morbidities requiring supervision/potential complications:  1. Severe spinal pain requiring IV dilaudid: discussed switching to Oxycontin, nurse has messaged Dr. Wyline Copas. Patient states that he was no on any opioids prior to admission. 2. Obesity (BMI 32.98) 3. Double vision: ?Miller-Fisher variant 4. Weakness in all 4 extremities: will require intensive PT and OT 5. HTN: continue amlodpine 3. Due to bladder management, bowel management, safety, skin/wound care, disease management, medication administration, pain management and patient education, does the patient require 24 hr/day rehab nursing? Yes 4. Does the patient require coordinated care of a physician, rehab nurse, therapy disciplines of PT, OT to address physical and functional deficits in the context of the above medical diagnosis(es)? Yes Addressing deficits in the following areas: balance, endurance, locomotion, strength, transferring, bowel/bladder control, bathing, dressing, feeding, grooming, toileting and psychosocial support 5. Can the patient actively participate in an intensive therapy program of at least 3 hrs of therapy per day at least 5 days per week? Yes 6. The potential for patient to make measurable gains while on inpatient rehab is good 7. Anticipated functional outcomes upon discharge from inpatient rehab are min assist  with PT, min assist with OT, independent with SLP. 8. Estimated rehab length of stay to reach the above functional goals is: 2-3 weeks 9. Anticipated discharge destination: Home 10. Overall Rehab/Functional Prognosis: good   RECOMMENDATIONS: This patient's condition is appropriate for continued rehabilitative care in the following setting: CIR Patient has agreed to participate in recommended program. Yes Note that insurance prior authorization may be required for reimbursement for recommended care.   Comment: Thank you for this consult. Admission coordinator to follow.    I have personally  performed a face to face diagnostic evaluation, including, but not limited to relevant history and physical exam findings, of this patient and developed relevant assessment and plan.  Additionally, I have reviewed and concur with the physician assistant's documentation above.   Leeroy Cha, MD   Bary Leriche, PA-C 02/09/2020          Revision History                             Routing History                   Note Details  Author Ranell Patrick, Clide Deutscher, MD File Time 02/09/2020  1:30 PM  Author Type Physician Status Signed  Last Editor Izora Ribas, MD Service Physical Medicine and Rehabilitation

## 2020-02-19 NOTE — Progress Notes (Signed)
Patient arrived on unit, oriented to unit. Reviewed medications, therapy schedule, rehab routine and plan of care. States an understanding of information reviewed. Patient is upset about visitor policy and claims he has always had two visitors, his son and wife. And he wants to go home if he can not have both of them visit. No complications noted at this time. Patient reports no pain and is [name]

## 2020-02-19 NOTE — Progress Notes (Signed)
Inpatient Rehabilitation-Admissions Coordinator   Notified pt and his wife of bed offer; they have accepted. Reviewed insurance benefit letter, and consent forms. All questions answered. Received medical clearance from Dr. Alfredia Ferguson for admit to CIR today. RN and [facility] team aware of plan for today.   Please call if questions.   Raechel Ache, OTR/L  Rehab Admissions Coordinator  [phone] 02/19/2020 10:18 AM

## 2020-02-19 NOTE — H&P (Shared)
Physical Medicine and Rehabilitation Admission H&P    Chief Complaint  Patient presents with  . Functional deficits   . Numbness/weakness    HPI: Jon Townsend is a 65 year old male with history of T2DM, HTN, BPH who presented toED on 02/02/29 with 48 hours of progressive weakness BLE>BUE and paraesthesias, numbness bilateral hands and feet and inability to walk. He was found to be Covid + and reported having HA, URI symptoms as well as anorexia. Work up revealed areflexia with tetraplegia concerning for GBS and attempts at LP unsuccessful. He received IVIG X 5 doses as neurology felt that patient with GBS in setting of Covid infection. MRI spine done as part of work up an showed degenerative spondylosis with foraminal stenosis C5/C6, multilevel degenerative spondylosis lumbar spine with moderate spinal stenosis L2/3-L3/4 --incidental soft tisuse edema noted within posterior R>L paraspinal musculature most likely related to LP attempts.    Hyponatremia managed with fluid restriction. Foley placed due to urinary retention and remains in place. He had issues with severe constipation and KUB showed concerns of ileus. CT abdomen/pelvis ordered for work up due to abdominal pain with blood in stool. This revealed small bowel thickening question chronic v/s due to colitis, small amount of stool in colon and diffuse hepatic steatosis with hepatic cysts.  He continued to have diarrhea with resultant worsening of hyponatremia with AKI due to GI losses which has improved with fluid boluses. H/H being monitored and with drop 15-->13.6. On 02/07, he developed thrombocytopenia ( plt-100) with leucocytosis (WBC 16.9)  being monitored and has resolved. Po intake improving with rise in BS. He continues to have weakness with sensory deficits affecting ADLs and mobility. CIR recommended due to functional decline.    Review of Systems  Constitutional: Negative for chills and fever.  HENT: Negative for congestion  and hearing loss.   Eyes: Negative for blurred vision and pain.  Respiratory: Negative for cough and shortness of breath.   Cardiovascular: Negative for chest pain and palpitations.  Gastrointestinal: Positive for blood in stool. Negative for constipation, heartburn and nausea.       Abdominal distension-->Liquid stool that oozes out after walking--once a day.   Genitourinary:       History of hesitancy-->better with flomax  Musculoskeletal: Positive for back pain.  Skin: Negative for rash.  Neurological: Positive for sensory change and weakness. Negative for dizziness and headaches.  Psychiatric/Behavioral: The patient has insomnia.       Past Medical History:  Diagnosis Date  . Allergy   . BPH (benign prostatic hyperplasia)   . Diabetes mellitus type 2 in obese ([place])   . Glaucoma   . Hypertension     Past Surgical History:  Procedure Laterality Date  . Broken finger    . TONSILLECTOMY      Family History  Problem Relation Age of Onset  . Cancer Father   . Early death Father   . Diabetes Sister   . Heart disease Sister   . COPD Sister   . Diabetes Sister   . Colon cancer Neg Hx   . Esophageal cancer Neg Hx   . Stomach cancer Neg Hx   . Rectal cancer Neg Hx     Social History:   Married. Retired from [employer] a week PTA.  reports that he has quit smoking. He has never used smokeless tobacco. He reports that he does not drink alcohol and does not use drugs.    Allergies: No Known Allergies  Medications Prior to Admission  Medication Sig Dispense Refill  . amLODipine (NORVASC) 5 MG tablet Take 1 tablet (5 mg total) by mouth daily. 90 tablet 1  . atorvastatin (LIPITOR) 40 MG tablet Take 1 tablet (40 mg total) by mouth daily. (Patient taking differently: Take 40 mg by mouth at bedtime.) 90 tablet 3  . Coenzyme Q10 (COQ10) 100 MG CAPS Take 100 mg by mouth in the morning.    . Ergocalciferol (VITAMIN D2) 50 MCG (2000 UT) TABS Take 2,000 mg by mouth daily.    .  fexofenadine (ALLEGRA) 180 MG tablet Take 180 mg by mouth in the morning.    Marland Kitchen ibuprofen (ADVIL) 200 MG tablet Take 400 mg by mouth every 6 (six) hours as needed for mild pain (or headaches).    Javier Docker Oil 1000 MG CAPS Take 1,000 mg by mouth daily.    Marland Kitchen latanoprost (XALATAN) 0.005 % ophthalmic solution Place 1 drop into both eyes at bedtime.    . meloxicam (MOBIC) 7.5 MG tablet Take 1 tablet (7.5 mg total) by mouth 2 (two) times daily as needed for pain. 30 tablet 2  . metFORMIN (GLUCOPHAGE-XR) 500 MG 24 hr tablet Take 2 tablets (1,000 mg total) by mouth daily with breakfast. 60 tablet 5  . metoprolol tartrate (LOPRESSOR) 50 MG tablet Take 1 tablet (50 mg total) by mouth 2 (two) times daily. (Patient taking differently: Take 50 mg by mouth in the morning and at bedtime.) 180 tablet 3  . tamsulosin (FLOMAX) 0.4 MG CAPS capsule Take 1 capsule (0.4 mg total) by mouth daily. 90 capsule 3  . vitamin C (ASCORBIC ACID) 500 MG tablet Take 500-1,000 mg by mouth daily.    . Blood Glucose Monitoring Suppl (ONETOUCH VERIO) w/Device KIT Use once daily to check blood sugar 1 kit 0  . glucose blood (ONETOUCH VERIO) test strip Use as once daily to check blood sugar. 100 each 3  . Lancets (ONETOUCH ULTRASOFT) lancets Use daily to check blood sugar. 100 each 3    Drug Regimen Review { DRUG REGIMEN [id number]}  Home: Home Living Family/patient expects to be discharged to:: Private residence Living Arrangements: Spouse/significant other Available Help at Discharge: Family,Available 24 hours/day Type of Home: House Home Access: Stairs to enter [employer] of Steps: 2 Entrance Stairs-Rails: None Home Layout: Two level,Able to live on main level with bedroom/bathroom Bathroom Shower/Tub: Multimedia programmer: Standard Bathroom Accessibility: Yes Home Equipment: Environmental consultant - 2 wheels,Shower seat,Bedside commode Additional Comments: spouse with recent hip fx, still utilizing cane to  ambulate, cannot provide physical assistance   Functional History: Prior Function Level of Independence: Independent Comments: Just retired 1/14  Functional Status:  Mobility: Bed Mobility Overal bed mobility: Needs Assistance Bed Mobility: Supine to Sit Rolling: Supervision Supine to sit: Min guard,HOB elevated Sit to supine: Min guard,HOB elevated General bed mobility comments: Heavy use of rail to get to EOB with increased time. Transfers Overall transfer level: Needs assistance Equipment used: Rolling walker (2 wheeled) Transfer via Lift Equipment: Stedy Transfers: Sit to/from Stand Sit to Stand: [employer] physical assistance,+2 safety/equipment Stand pivot transfers: Mod assist Squat pivot transfers: Max assist General transfer comment: Min A of 2 to stand from EOB 1x and chair 1x with cues for hand placement as pt pulling up on RW. Pt would follow direction for one hand on current sitting surface to push up from and other on RW as he had difficulty transferring his hands to the RW on one failed attempt  to come to stand. Ambulation/Gait Ambulation/Gait assistance: Min assist,+2 safety/equipment Gait Distance (Feet): 50 Feet (x2 bouts of ~50 ft > ~40 ft) Assistive device: Rolling walker (2 wheeled) Gait Pattern/deviations: Step-through pattern,Trunk flexed,Decreased step length - right,Decreased step length - left,Decreased stride length General Gait Details: Slow, unsteady gait with bil knee and ankle instability laterally; heavy WB through BUEs. Cued pt to perform heel-to-toe sequence with steps, with mod success. Pt remains within RW throughout. No overt LOB, minA for steadying and chair follow. Gait velocity: reduced Gait velocity interpretation: <1.31 ft/sec, indicative of household ambulator    ADL: ADL Overall ADL's : Needs assistance/impaired Eating/Feeding: Set up,Supervision/ safety Eating/Feeding Details (indicate cue type and reason): may benfit from built  up tubing Grooming: Set up Grooming Details (indicate cue type and reason): Patient tolerated 3/3 grooming tasks in perched position in [place] at sink. Will attempt standing at sink with chair directly behind patient at time of next session. Upper Body Bathing: Set up,Sitting Lower Body Bathing: Moderate assistance,Sitting/lateral leans Lower Body Bathing Details (indicate cue type and reason): pt able to reach down to LB from sitting EOB but requries MOD A for cleanliness Upper Body Dressing : Set up,Sitting Lower Body Dressing: Moderate assistance,Sitting/lateral leans Lower Body Dressing Details (indicate cue type and reason): pt able to reach to BLEs from EOB but requires MOD A d/t fatigur from reaching out of BOS Toilet Transfer: Total assistance,Minimal assistance Toilet Transfer Details (indicate cue type and reason): simualated via pivot transfer to recliner with stedy, MIN A to stand to stedy with use of momentum and total A for the pivot with use of stedy Functional mobility during ADLs: Minimal assistance (sit<>stand only from EOB with stedy) General ADL Comments: pt continues to present with generalized deconditioning, decreased activity tolerance and generalized weakness ( RUE>LUE) impacting pts ability to complete BADLs independently  Cognition: Cognition Overall Cognitive Status: Within Functional Limits for tasks assessed Orientation Level: Oriented X4 Cognition Arousal/Alertness: Awake/alert Behavior During Therapy: WFL for tasks assessed/performed Overall Cognitive Status: Within Functional Limits for tasks assessed Area of Impairment: Problem solving Following Commands: Follows multi-step commands inconsistently,Follows multi-step commands with increased time Problem Solving: Slow processing,Difficulty sequencing,Requires verbal cues,Requires tactile cues General Comments: Pt pleasant and motivated to participate and improve.   Blood pressure 124/80, pulse 79,  temperature 97.7 F (36.5 C), temperature source Oral, resp. rate 18, height 6' 1" (1.854 m), weight 113.4 kg, SpO2 97 %. Physical Exam Vitals and nursing note reviewed.  Constitutional:      Appearance: Normal appearance.     Comments: Anxious and fatigued appearing.   Eyes:     Conjunctiva/sclera:     Right eye: Right conjunctiva is injected.     Comments: Left eye with surrounding erythema/mild edema.   Musculoskeletal:     Comments: Lower back/flank with fullness and tenderness to palpation.   Neurological:     Mental Status: He is alert and oriented to person, place, and time.     Results for orders placed or performed during the hospital encounter of 02/03/20 (from the past 48 hour(s))  Glucose, capillary     Status: Abnormal   Collection Time: 02/17/20 12:18 PM  Result Value Ref Range   Glucose-Capillary 277 (H) 70 - 99 mg/dL    Comment: Glucose reference range applies only to samples taken after fasting for at least 8 hours.  Glucose, capillary     Status: Abnormal   Collection Time: 02/17/20  4:07 PM  Result Value Ref Range  Glucose-Capillary 111 (H) 70 - 99 mg/dL    Comment: Glucose reference range applies only to samples taken after fasting for at least 8 hours.  Glucose, capillary     Status: Abnormal   Collection Time: 02/17/20  9:03 PM  Result Value Ref Range   Glucose-Capillary 181 (H) 70 - 99 mg/dL    Comment: Glucose reference range applies only to samples taken after fasting for at least 8 hours.   Comment 1 Notify RN    Comment 2 Document in Chart   CBC     Status: Abnormal   Collection Time: 02/18/20  3:42 AM  Result Value Ref Range   WBC 11.1 (H) 4.0 - 10.5 K/uL   RBC 4.39 4.22 - 5.81 MIL/uL   Hemoglobin 13.4 13.0 - 17.0 g/dL   HCT 38.7 (L) 39.0 - 52.0 %   MCV 88.2 80.0 - 100.0 fL   MCH 30.5 26.0 - 34.0 pg   MCHC 34.6 30.0 - 36.0 g/dL   RDW 12.2 11.5 - 15.5 %   Platelets 125 (L) 150 - 400 K/uL   nRBC 0.0 0.0 - 0.2 %    Comment: Performed at [facility], [place] [address] [place], [place] [address]  Basic metabolic panel     Status: Abnormal   Collection Time: 02/18/20  3:42 AM  Result Value Ref Range   Sodium 129 (L) 135 - 145 mmol/L   Potassium 3.8 3.5 - 5.1 mmol/L   Chloride 97 (L) 98 - 111 mmol/L   CO2 23 22 - 32 mmol/L   Glucose, Bld 152 (H) 70 - 99 mg/dL    Comment: Glucose reference range applies only to samples taken after fasting for at least 8 hours.   BUN 16 8 - 23 mg/dL   Creatinine, Ser 0.70 0.61 - 1.24 mg/dL   Calcium 8.0 (L) 8.9 - 10.3 mg/dL   GFR, Estimated >60 >60 mL/min    Comment: (NOTE) Calculated using the CKD-EPI Creatinine Equation (2021)    Anion gap 9 5 - 15    Comment: Performed at [facility] [address], [place], [place] [address]  Glucose, capillary     Status: Abnormal   Collection Time: 02/18/20  6:05 AM  Result Value Ref Range   Glucose-Capillary 153 (H) 70 - 99 mg/dL    Comment: Glucose reference range applies only to samples taken after fasting for at least 8 hours.   Comment 1 Notify RN    Comment 2 Document in Chart   Glucose, capillary     Status: Abnormal   Collection Time: 02/18/20 11:29 AM  Result Value Ref Range   Glucose-Capillary 251 (H) 70 - 99 mg/dL    Comment: Glucose reference range applies only to samples taken after fasting for at least 8 hours.   Comment 1 Notify RN    Comment 2 Document in Chart   Glucose, capillary     Status: Abnormal   Collection Time: 02/18/20  4:14 PM  Result Value Ref Range   Glucose-Capillary 215 (H) 70 - 99 mg/dL    Comment: Glucose reference range applies only to samples taken after fasting for at least 8 hours.   Comment 1 Notify RN    Comment 2 Document in Chart   Glucose, capillary     Status: Abnormal   Collection Time: 02/18/20  9:41 PM  Result Value Ref Range   Glucose-Capillary 250 (H) 70 - 99 mg/dL    Comment: Glucose  reference range applies only to samples taken after fasting for at least 8 hours.   Comment 1  Notify RN    Comment 2 Document in Chart   CBC with Differential/Platelet     Status: Abnormal   Collection Time: 02/19/20  2:55 AM  Result Value Ref Range   WBC 8.9 4.0 - 10.5 K/uL   RBC 4.37 4.22 - 5.81 MIL/uL   Hemoglobin 13.6 13.0 - 17.0 g/dL   HCT 38.9 (L) 39.0 - 52.0 %   MCV 89.0 80.0 - 100.0 fL   MCH 31.1 26.0 - 34.0 pg   MCHC 35.0 30.0 - 36.0 g/dL   RDW 12.4 11.5 - 15.5 %   Platelets 154 150 - 400 K/uL   nRBC 0.0 0.0 - 0.2 %   Neutrophils Relative % 69 %   Neutro Abs 6.2 1.7 - 7.7 K/uL   Lymphocytes Relative 17 %   Lymphs Abs 1.5 0.7 - 4.0 K/uL   Monocytes Relative 9 %   Monocytes Absolute 0.8 0.1 - 1.0 K/uL   Eosinophils Relative 3 %   Eosinophils Absolute 0.3 0.0 - 0.5 K/uL   Basophils Relative 1 %   Basophils Absolute 0.1 0.0 - 0.1 K/uL   Immature Granulocytes 1 %   Abs Immature Granulocytes 0.07 0.00 - 0.07 K/uL    Comment: Performed at [facility], [address] [place], [place] [address]  Comprehensive metabolic panel     Status: Abnormal   Collection Time: 02/19/20  2:55 AM  Result Value Ref Range   Sodium 133 (L) 135 - 145 mmol/L   Potassium 4.0 3.5 - 5.1 mmol/L   Chloride 96 (L) 98 - 111 mmol/L   CO2 27 22 - 32 mmol/L   Glucose, Bld 140 (H) 70 - 99 mg/dL    Comment: Glucose reference range applies only to samples taken after fasting for at least 8 hours.   BUN 14 8 - 23 mg/dL   Creatinine, Ser 0.83 0.61 - 1.24 mg/dL   Calcium 8.5 (L) 8.9 - 10.3 mg/dL   Total Protein 6.1 (L) 6.5 - 8.1 g/dL   Albumin 2.1 (L) 3.5 - 5.0 g/dL   AST 25 15 - 41 U/L   ALT 33 0 - 44 U/L   Alkaline Phosphatase 79 38 - 126 U/L   Total Bilirubin 0.8 0.3 - 1.2 mg/dL   GFR, Estimated >60 >60 mL/min    Comment: (NOTE) Calculated using the CKD-EPI Creatinine Equation (2021)    Anion gap 10 5 - 15    Comment: Performed at [facility], [place] [address] [place], [place] [address]  Phosphorus     Status: None   Collection Time: 02/19/20  2:55 AM  Result Value Ref  Range   Phosphorus 3.9 2.5 - 4.6 mg/dL    Comment: Performed at [facility] [address], [place], [place] [address]  Magnesium     Status: None   Collection Time: 02/19/20  2:55 AM  Result Value Ref Range   Magnesium 2.0 1.7 - 2.4 mg/dL    Comment: Performed at [facility], [place] [address], [place], [place] [address]  Glucose, capillary     Status: Abnormal   Collection Time: 02/19/20  6:08 AM  Result Value Ref Range   Glucose-Capillary 157 (H) 70 - 99 mg/dL    Comment: Glucose reference range applies only to samples taken after fasting for at least 8 hours.   Comment 1  Notify RN    Comment 2 Document in Chart   Glucose, capillary     Status: Abnormal   Collection Time: 02/19/20 12:03 PM  Result Value Ref Range   Glucose-Capillary 220 (H) 70 - 99 mg/dL    Comment: Glucose reference range applies only to samples taken after fasting for at least 8 hours.   Comment 1 Notify RN    Comment 2 Document in Chart    No results found.     Medical Problem List and Plan: 1.  *** secondary to ***  -patient may *** shower  -ELOS/Goals: *** 2.  Antithrombotics: -DVT/anticoagulation:  Pharmaceutical: Lovenox--will add as platelets normalized.   -antiplatelet therapy: N/A 3. Pain Management: Gabapentin was increased to TID on 02/10.  Has been refusing lidocaine patches.  --Continue oxycodone prn--dilaudid d/c yesterday.  4. Mood: LCSW to follow for evaluation and support.   -antipsychotic agents: N/A 5. Neuropsych: This patient is capable of making decisions on her own behalf. 6. Skin/Wound Care: Routine pressure relief measures. 7. Fluids/Electrolytes/Nutrition: Monitor I/O. Check lytes in am 8. Abdominal distension: Question persistent ileus v/s colitis.   --Will schedule simethicone and start bowel program with suppository in evenings  --Repeat KUB 9. HTN: Monitor BP tid--continue Norvasc and lopressor 10. H/o BPH/Neurogenic bladder: Foley in place. Discontinue in 1-2  days after bowel regimen established.  11. T2DM: Monitor BS ac/hs. BS trending upwards.   --Resume metformin once daily for now as intake starting to improve.  12. Hyponatremia: Na levels improving--> will relax FR to 1800 cc/day.  --Recheck BMET in am.  13. Bloody stools: Will order hemoccults as H/H has dropped from 15-->13.6.  14. Anorexia/ Low protein stores: Add prostat and offer Ensure Max if tolerated. 15. Eye infection: Symptoms improving but still erythematous   --Continue allegra and polytrim eye drops    ***  Bary Leriche, PA-C 02/19/2020

## 2020-02-19 NOTE — Progress Notes (Addendum)
Pharmacy review noted--will not start on Meloxicam due to ongoing abdominal pain/blood in stools/AKI and patient not on this med on acute. Also will start with low dose metformin as intake continues to be variable.

## 2020-02-19 NOTE — H&P (Signed)
Physical Medicine and Rehabilitation Admission H&P    Chief Complaint  Patient presents with  . Functional deficits   . Numbness/weakness    HPI: Jon Townsend is a 65 year old male with history of T2DM, HTN, BPH who presented toED on 02/02/29 with 48 hours of progressive weakness BLE>BUE and paraesthesias, numbness bilateral hands and feet and inability to walk. He was found to be Covid + and reported having HA, URI symptoms as well as anorexia. Work up revealed areflexia with tetraplegia concerning for GBS and attempts at LP unsuccessful. He received IVIG X 5 doses as neurology felt that patient with GBS in setting of Covid infection. MRI spine done as part of work up an showed degenerative spondylosis with foraminal stenosis C5/C6, multilevel degenerative spondylosis lumbar spine with moderate spinal stenosis L2/3-L3/4 --incidental soft tisuse edema noted within posterior R>L paraspinal musculature most likely related to LP attempts.    Hyponatremia managed with fluid restriction. Foley placed due to urinary retention and remains in place. He had issues with severe constipation and KUB showed concerns of ileus. CT abdomen/pelvis ordered for work up due to abdominal pain with blood in stool. This revealed small bowel thickening question chronic v/s due to colitis, small amount of stool in colon and diffuse hepatic steatosis with hepatic cysts.  He continued to have diarrhea with resultant worsening of hyponatremia with AKI due to GI losses which has improved with fluid boluses. H/H being monitored and with drop 15-->13.6. On 02/07, he developed thrombocytopenia ( plt-100) with leucocytosis (WBC 16.9)  being monitored and has resolved. Po intake improving with rise in BS. He continues to have weakness with sensory deficits affecting ADLs and mobility. CIR recommended due to functional decline.   Pt reports BMs are liquid- but usually incontinent- 1-2x/day- has foley since couldn't void- has hx  of BPH.  Says abd feels tight as a drum- mainly gas feeling.  Some kind of pain meds from Dr Erlinda Hong in Ortho/outpt was taking 2x/week at home- wants that back, if possible.  Notes drinking well- a lot, but not eating well.     Review of Systems  Constitutional: Negative for chills and fever.  HENT: Negative for congestion and hearing loss.   Eyes: Negative for blurred vision and pain.  Respiratory: Negative for cough and shortness of breath.   Cardiovascular: Negative for chest pain and palpitations.  Gastrointestinal: Positive for blood in stool. Negative for constipation, heartburn and nausea.       Abdominal distension-->Liquid stool that oozes out after walking--once a day.   Genitourinary:       History of hesitancy-->better with flomax  Musculoskeletal: Positive for back pain.  Skin: Negative for rash.  Neurological: Positive for sensory change and weakness. Negative for dizziness and headaches.  Psychiatric/Behavioral: The patient has insomnia.   All other systems reviewed and are negative.     Past Medical History:  Diagnosis Date  . Allergy   . BPH (benign prostatic hyperplasia)   . Diabetes mellitus type 2 in obese ([place])   . Glaucoma   . Hypertension     Past Surgical History:  Procedure Laterality Date  . Broken finger    . TONSILLECTOMY      Family History  Problem Relation Age of Onset  . Cancer Father   . Early death Father   . Diabetes Sister   . Heart disease Sister   . COPD Sister   . Diabetes Sister   . Colon cancer Neg Hx   .  Esophageal cancer Neg Hx   . Stomach cancer Neg Hx   . Rectal cancer Neg Hx     Social History:   Married. Retired from [employer] a week PTA.  reports that he has quit smoking. He has never used smokeless tobacco. He reports that he does not drink alcohol and does not use drugs.    Allergies: No Known Allergies    Medications Prior to Admission  Medication Sig Dispense Refill  . amLODipine (NORVASC) 5 MG tablet Take 1  tablet (5 mg total) by mouth daily. 90 tablet 1  . atorvastatin (LIPITOR) 40 MG tablet Take 1 tablet (40 mg total) by mouth daily. (Patient taking differently: Take 40 mg by mouth at bedtime.) 90 tablet 3  . Blood Glucose Monitoring Suppl (ONETOUCH VERIO) w/Device KIT Use once daily to check blood sugar 1 kit 0  . Coenzyme Q10 (COQ10) 100 MG CAPS Take 100 mg by mouth in the morning.    . docusate sodium (COLACE) 100 MG capsule Take 1 capsule (100 mg total) by mouth 2 (two) times daily. 10 capsule 0  . fexofenadine (ALLEGRA) 180 MG tablet Take 180 mg by mouth in the morning.    . gabapentin (NEURONTIN) 100 MG capsule Take 1 capsule (100 mg total) by mouth 3 (three) times daily.    Marland Kitchen glucose blood (ONETOUCH VERIO) test strip Use as once daily to check blood sugar. 100 each 3  . Krill Oil 1000 MG CAPS Take 1,000 mg by mouth daily.    . Lancets (ONETOUCH ULTRASOFT) lancets Use daily to check blood sugar. 100 each 3  . latanoprost (XALATAN) 0.005 % ophthalmic solution Place 1 drop into both eyes at bedtime.    Derrill Memo ON 02/20/2020] lidocaine (LIDODERM) 5 % Place 1 patch onto the skin daily. Remove & Discard patch within 12 hours or as directed by MD 30 patch 0  . melatonin 10 MG TABS Take 10 mg by mouth at bedtime as needed (sleep).  0  . metFORMIN (GLUCOPHAGE-XR) 500 MG 24 hr tablet Take 2 tablets (1,000 mg total) by mouth daily with breakfast. 60 tablet 5  . metoprolol tartrate (LOPRESSOR) 50 MG tablet Take 1 tablet (50 mg total) by mouth 2 (two) times daily. (Patient taking differently: Take 50 mg by mouth in the morning and at bedtime.) 180 tablet 3  . ondansetron (ZOFRAN-ODT) 4 MG disintegrating tablet Take 1 tablet (4 mg total) by mouth every 8 (eight) hours as needed for nausea or vomiting. 20 tablet 0  . oxyCODONE 10 MG TABS Take 1 tablet (10 mg total) by mouth every 4 (four) hours as needed for severe pain. 10 tablet 0  . simethicone (MYLICON) 80 MG chewable tablet Chew 0.5 tablets (40 mg  total) by mouth every 6 (six) hours as needed for flatulence. 30 tablet 0  . sodium chloride (OCEAN) 0.65 % SOLN nasal spray Place 1 spray into both nostrils as needed for congestion.  0  . tamsulosin (FLOMAX) 0.4 MG CAPS capsule Take 1 capsule (0.4 mg total) by mouth daily. 90 capsule 3  . trimethoprim-polymyxin b (POLYTRIM) ophthalmic solution Place 1 drop into the left eye every 4 (four) hours. 10 mL 0  . vitamin C (ASCORBIC ACID) 500 MG tablet Take 500-1,000 mg by mouth daily.      Drug Regimen Review  Drug regimen was reviewed and remains appropriate with no significant issues identified  Home: Home Living Family/patient expects to be discharged to:: Private residence Living Arrangements: Spouse/significant other  Functional History:    Functional Status:  Mobility:          ADL:    Cognition: Cognition Orientation Level: Oriented X4     Blood pressure 123/71, pulse 77, temperature 98.2 F (36.8 C), temperature source Oral, resp. rate 16, height _0  (1.854 m), weight 108.7 kg, SpO2 100 %. Physical Exam Vitals and nursing note reviewed.  Constitutional:      Appearance: Normal appearance.     Comments: Anxious and fatigued appearing.   Sitting up slightly in bed- appropriate, but depressed/flat affect, NAD  HENT:     Head: Normocephalic and atraumatic.     Comments: Smile equal    Right Ear: External ear normal.     Left Ear: External ear normal.     Nose: Nose normal. No congestion.     Mouth/Throat:     Mouth: Mucous membranes are dry.     Pharynx: Oropharynx is clear. No oropharyngeal exudate.  Eyes:     General:        Right eye: No discharge.        Left eye: No discharge.     Extraocular Movements: Extraocular movements intact.     Conjunctiva/sclera:     Right eye: Right conjunctiva is injected.     Comments: Left eye with surrounding erythema/mild edema.   Cardiovascular:     Rate and Rhythm: Normal rate and regular rhythm.     Heart  sounds: Normal heart sounds. No murmur heard. No gallop.   Pulmonary:     Comments: CTA B/L- no W/R/R- good air movement Abdominal:     Comments: Somewhat firm, and feels distended, maybe from gas, based on previous KUB- NT, hyperactive BS  Genitourinary:    Comments: Foley in place - light to medium amber urine in bag Musculoskeletal:     Cervical back: Normal range of motion and neck supple.     Comments: Lower back/flank with fullness and tenderness to palpation.   UEs- 5-/5 in Biceps, triceps, grip and finger abd LEs- HF 4-/5 on R; 4/5 on L; KE 4/5, DF and PF 4+/5 B/L Old broken 5th R finger- contracted  Skin:    Comments: L forearm IV_ looks OK Wasn't able to turn pt to look at backside  Neurological:     Mental Status: He is alert and oriented to person, place, and time.     Comments: Says intact to light touch in all 4 extremities even when tested 2x/ C/o numby nerve pain  Psychiatric:     Comments: Flat, appropriate, but slightly depressed     Results for orders placed or performed during the hospital encounter of 02/03/20 (from the past 48 hour(s))  Glucose, capillary     Status: Abnormal   Collection Time: 02/17/20  4:07 PM  Result Value Ref Range   Glucose-Capillary 111 (H) 70 - 99 mg/dL    Comment: Glucose reference range applies only to samples taken after fasting for at least 8 hours.  Glucose, capillary     Status: Abnormal   Collection Time: 02/17/20  9:03 PM  Result Value Ref Range   Glucose-Capillary 181 (H) 70 - 99 mg/dL    Comment: Glucose reference range applies only to samples taken after fasting for at least 8 hours.   Comment 1 Notify RN    Comment 2 Document in Chart   CBC     Status: Abnormal   Collection Time: 02/18/20  3:42 AM  Result Value Ref Range  WBC 11.1 (H) 4.0 - 10.5 K/uL   RBC 4.39 4.22 - 5.81 MIL/uL   Hemoglobin 13.4 13.0 - 17.0 g/dL   HCT 38.7 (L) 39.0 - 52.0 %   MCV 88.2 80.0 - 100.0 fL   MCH 30.5 26.0 - 34.0 pg   MCHC 34.6  30.0 - 36.0 g/dL   RDW 12.2 11.5 - 15.5 %   Platelets 125 (L) 150 - 400 K/uL   nRBC 0.0 0.0 - 0.2 %    Comment: Performed at [facility], [place] [address] [place], [place] [address]  Basic metabolic panel     Status: Abnormal   Collection Time: 02/18/20  3:42 AM  Result Value Ref Range   Sodium 129 (L) 135 - 145 mmol/L   Potassium 3.8 3.5 - 5.1 mmol/L   Chloride 97 (L) 98 - 111 mmol/L   CO2 23 22 - 32 mmol/L   Glucose, Bld 152 (H) 70 - 99 mg/dL    Comment: Glucose reference range applies only to samples taken after fasting for at least 8 hours.   BUN 16 8 - 23 mg/dL   Creatinine, Ser 0.70 0.61 - 1.24 mg/dL   Calcium 8.0 (L) 8.9 - 10.3 mg/dL   GFR, Estimated >60 >60 mL/min    Comment: (NOTE) Calculated using the CKD-EPI Creatinine Equation (2021)    Anion gap 9 5 - 15    Comment: Performed at [facility] [address], [place], [place] [address]  Glucose, capillary     Status: Abnormal   Collection Time: 02/18/20  6:05 AM  Result Value Ref Range   Glucose-Capillary 153 (H) 70 - 99 mg/dL    Comment: Glucose reference range applies only to samples taken after fasting for at least 8 hours.   Comment 1 Notify RN    Comment 2 Document in Chart   Glucose, capillary     Status: Abnormal   Collection Time: 02/18/20 11:29 AM  Result Value Ref Range   Glucose-Capillary 251 (H) 70 - 99 mg/dL    Comment: Glucose reference range applies only to samples taken after fasting for at least 8 hours.   Comment 1 Notify RN    Comment 2 Document in Chart   Glucose, capillary     Status: Abnormal   Collection Time: 02/18/20  4:14 PM  Result Value Ref Range   Glucose-Capillary 215 (H) 70 - 99 mg/dL    Comment: Glucose reference range applies only to samples taken after fasting for at least 8 hours.   Comment 1 Notify RN    Comment 2 Document in Chart   Glucose, capillary     Status: Abnormal   Collection Time: 02/18/20  9:41 PM  Result Value Ref Range   Glucose-Capillary 250 (H)  70 - 99 mg/dL    Comment: Glucose reference range applies only to samples taken after fasting for at least 8 hours.   Comment 1 Notify RN    Comment 2 Document in Chart   CBC with Differential/Platelet     Status: Abnormal   Collection Time: 02/19/20  2:55 AM  Result Value Ref Range   WBC 8.9 4.0 - 10.5 K/uL   RBC 4.37 4.22 - 5.81 MIL/uL   Hemoglobin 13.6 13.0 - 17.0 g/dL   HCT 38.9 (L) 39.0 - 52.0 %   MCV 89.0 80.0 - 100.0 fL   MCH 31.1 26.0 - 34.0 pg   MCHC 35.0 30.0 - 36.0 g/dL   RDW 12.4  11.5 - 15.5 %   Platelets 154 150 - 400 K/uL   nRBC 0.0 0.0 - 0.2 %   Neutrophils Relative % 69 %   Neutro Abs 6.2 1.7 - 7.7 K/uL   Lymphocytes Relative 17 %   Lymphs Abs 1.5 0.7 - 4.0 K/uL   Monocytes Relative 9 %   Monocytes Absolute 0.8 0.1 - 1.0 K/uL   Eosinophils Relative 3 %   Eosinophils Absolute 0.3 0.0 - 0.5 K/uL   Basophils Relative 1 %   Basophils Absolute 0.1 0.0 - 0.1 K/uL   Immature Granulocytes 1 %   Abs Immature Granulocytes 0.07 0.00 - 0.07 K/uL    Comment: Performed at [facility] [address] [place], [place] [address]  Comprehensive metabolic panel     Status: Abnormal   Collection Time: 02/19/20  2:55 AM  Result Value Ref Range   Sodium 133 (L) 135 - 145 mmol/L   Potassium 4.0 3.5 - 5.1 mmol/L   Chloride 96 (L) 98 - 111 mmol/L   CO2 27 22 - 32 mmol/L   Glucose, Bld 140 (H) 70 - 99 mg/dL    Comment: Glucose reference range applies only to samples taken after fasting for at least 8 hours.   BUN 14 8 - 23 mg/dL   Creatinine, Ser 0.83 0.61 - 1.24 mg/dL   Calcium 8.5 (L) 8.9 - 10.3 mg/dL   Total Protein 6.1 (L) 6.5 - 8.1 g/dL   Albumin 2.1 (L) 3.5 - 5.0 g/dL   AST 25 15 - 41 U/L   ALT 33 0 - 44 U/L   Alkaline Phosphatase 79 38 - 126 U/L   Total Bilirubin 0.8 0.3 - 1.2 mg/dL   GFR, Estimated >60 >60 mL/min    Comment: (NOTE) Calculated using the CKD-EPI Creatinine Equation (2021)    Anion gap 10 5 - 15    Comment: Performed at [facility],  [place] [address] [place], [place] [address]  Phosphorus     Status: None   Collection Time: 02/19/20  2:55 AM  Result Value Ref Range   Phosphorus 3.9 2.5 - 4.6 mg/dL    Comment: Performed at [facility] [address], [place], [place] [address]  Magnesium     Status: None   Collection Time: 02/19/20  2:55 AM  Result Value Ref Range   Magnesium 2.0 1.7 - 2.4 mg/dL    Comment: Performed at [facility], [place] [address], [place], [place] [address]  Glucose, capillary     Status: Abnormal   Collection Time: 02/19/20  6:08 AM  Result Value Ref Range   Glucose-Capillary 157 (H) 70 - 99 mg/dL    Comment: Glucose reference range applies only to samples taken after fasting for at least 8 hours.   Comment 1 Notify RN    Comment 2 Document in Chart   Glucose, capillary     Status: Abnormal   Collection Time: 02/19/20 12:03 PM  Result Value Ref Range   Glucose-Capillary 220 (H) 70 - 99 mg/dL    Comment: Glucose reference range applies only to samples taken after fasting for at least 8 hours.   Comment 1 Notify RN    Comment 2 Document in Chart    No results found.     Medical Problem List and Plan: 1.  Impaired strength, ADLs, function, mobility secondary to Guillain Barre syndrome from COVID- 2nd time having COIVD  -patient may  shower  -ELOS/Goals:  14-21 days- min A to supervision 2.  Antithrombotics: -DVT/anticoagulation:  Pharmaceutical: Lovenox--will add as platelets normalized.  -got approval from Acute physician for Lovenox- d/w pt .   -antiplatelet therapy: N/A 3. Pain Management:Increased gabapentin to 300 mg TID due to nerve painHas been refusing lidocaine patches.  --Continue oxycodone prn--dilaudid d/c yesterday.  4. Mood: LCSW to follow for evaluation and support.   -antipsychotic agents: N/A 5. Neuropsych: This patient is capable of making decisions on her own behalf. 6. Skin/Wound Care: Routine pressure relief measures. 7. Fluids/Electrolytes/Nutrition:  Monitor I/O. Check lytes in am 8. Abdominal distension: Question persistent ileus v/s colitis.   --Will schedule simethicone and start bowel program with suppository in evenings  --Repeat KUB- pt having bowel incontinence which can come with GBS- explained to pt, will not likely need dig stim.  9. HTN: Monitor BP tid--continue Norvasc and lopressor 10. H/o BPH/Neurogenic bladder: Foley in place. Discontinue in 1-2 days after bowel regimen established.  Increased Flomax to 0.8 mg daily, since was on 0.4 mg daily at home.  11. T2DM: Monitor BS ac/hs. BS trending upwards.   --Resume metformin once daily for now as intake starting to improve.   -hates Diabetic diet- said would change to regular diet IF BGs stayed well controlled to give more options 12. Hyponatremia: Na levels improving--> will relax FR to 1800 cc/day.  --Recheck BMET in am.  13. Bloody stools: Will order hemoccults as H/H has dropped from 15-->13.6.  14. Anorexia/ Low protein stores: Add prostat and offer Ensure Max if tolerated. 15. Eye infection: Symptoms improving but still erythematous   --Continue allegra and polytrim eye drops    Jon Townsend- PA-C 02/19/20   I have personally performed a face to face diagnostic evaluation of this patient and formulated the key components of the plan.  Additionally, I have personally reviewed laboratory data, imaging studies, as well as relevant notes and concur with the physician assistant's documentation above.     Jon Heys, MD 02/19/2020

## 2020-02-19 NOTE — IPOC Note (Signed)
Individualized overall Plan of Care [facility]) Patient Details Name: Jon Townsend MRN: [id number] DOB: Feb 08, 1955  Admitting Diagnosis: Sharlyn Bologna syndrome [facility])  Hospital Problems: Principal Problem:   Guillain Barr syndrome [facility]) Active Problems:   Bloody stool   Hyponatremia   Controlled type 2 diabetes mellitus with hyperglycemia, without long-term current use of insulin (HCC)   Ileus, postoperative (HCC)   Constipation   Abdominal distension   Abnormal CT scan, gastrointestinal tract     Functional Problem List: Nursing Bladder,Bowel,Endurance,Edema,Pain,Safety,Sensory  PT Balance,Endurance,Motor,Perception,Sensory  OT Balance,Endurance,Motor,Pain,Perception,Safety,Sensory,Skin Integrity  SLP    TR         Basic ADL's: OT Grooming,Bathing,Dressing,Toileting     Advanced  ADL's: OT       Transfers: PT Bed Mobility,Bed to Chair,Car,Furniture  OT Toilet,Tub/Shower     Locomotion: PT Ambulation,Wheelchair Mobility,Stairs     Additional Impairments: OT Fuctional Use of Upper Extremity  SLP        TR      Anticipated Outcomes Item Anticipated Outcome  Self Feeding    Swallowing      Basic self-care  Supervision  Toileting  Supervision   Bathroom Transfers Supervision  Bowel/Bladder  reduce incontinent episodes, manage bladder with equipment, regain continence  Transfers  supervision with LRAD  Locomotion  supervision with LRAD  Communication     Cognition     Pain  less than 5  Safety/Judgment  remain free of falls, skin breakdown, infection   Therapy Plan: PT Intensity: Minimum of 1-2 x/day ,45 to 90 minutes PT Frequency: 5 out of 7 days PT Duration Estimated Length of Stay: 18-21 days OT Intensity: Minimum of 1-2 x/day, 45 to 90 minutes OT Frequency: 5 out of 7 days OT Duration/Estimated Length of Stay: 18-21 days      Team Interventions: Nursing Interventions Patient/Family Education,Bladder Management,Bowel Management,Disease  Management/Prevention,Pain Management,Medication Management,Skin Care/Wound Management,Cognitive Remediation/Compensation,Discharge Planning,Psychosocial Support  PT interventions Ambulation/gait training,Discharge planning,Functional mobility training,Psychosocial support,Therapeutic Activities,Balance/vestibular training,Disease management/prevention,Neuromuscular re-education,Skin care/wound Heritage manager propulsion/positioning,DME/adaptive equipment instruction,Pain management,Splinting/orthotics,UE/LE Strength taining/ROM,Community reintegration,Functional electrical stimulation,Patient/family education,Stair training,UE/LE Coordination activities  OT Interventions Balance/vestibular training,Community reintegration,Discharge planning,Disease mangement/prevention,DME/adaptive equipment instruction,Functional mobility training,Functional Financial trader re-education,Pain management,Patient/family education,Psychosocial support,Self Care/advanced ADL retraining,Skin care/wound managment,Therapeutic Activities,Therapeutic Exercise,UE/LE Strength taining/ROM,UE/LE Coordination activities  SLP Interventions    TR Interventions    SW/CM Interventions Discharge Planning,Psychosocial Support,Patient/Family Education   Barriers to Discharge MD  Medical stability, Neurogenic bowel and bladder, Weight, and Behavior  Nursing Decreased caregiver support    PT Inaccessible home environment,Decreased caregiver support,Home environment access/layout 2 STE with 0 rails; pt wife is recovering from hip fx  OT Decreased caregiver support,Incontinence    SLP      SW Decreased caregiver support Wife still recovering from femur fracture 10/21 uses cane   Team Discharge Planning: Destination: PT-Home ,OT- Home , SLP-  Projected Follow-up: PT-Home health PT, OT-  Home health OT,24 hour supervision/assistance, SLP-  Projected Equipment Needs: PT-To be determined,  OT- To be determined, SLP-  Equipment Details: PT- , OT-reports having shower seat and 3 in 1 from wife previous surgeries Patient/family involved in discharge planning: PT- Patient,Family member/caregiver,  OT-Patient, SLP-   MD ELOS: 14-17 days. Medical Rehab Prognosis:  Good Assessment: 65 year old male with history of T2DM, HTN, BPH who presented to ED on 02/02/29 with 48 hours of progressive weakness BLE>BUE and paraesthesias, numbness bilateral hands and feet and inability to walk. He was found to be Covid + and reported having HA, URI symptoms as well as anorexia. Work  up revealed areflexia with tetraplegia concerning for GBS and attempts at LP unsuccessful. He received IVIG X 5 doses as neurology felt that patient with GBS in setting of Covid infection. MRI spine done as part of work up an showed degenerative spondylosis with foraminal stenosis C5/C6, multilevel degenerative spondylosis lumbar spine with moderate spinal stenosis L2/3-L3/4 --incidental soft tisuse edema noted within posterior R>L paraspinal musculature most likely related to LP attempts.  Hyponatremia managed with fluid restriction. Foley placed due to urinary retention and remains in place. He had issues with severe constipation and KUB showed concerns of ileus. CT abdomen/pelvis ordered for work up due to abdominal pain with blood in stool. This revealed small bowel thickening question chronic v/s due to colitis, small amount of stool in colon and diffuse hepatic steatosis with hepatic cysts.  He continued to have diarrhea with resultant worsening of hyponatremia with AKI due to GI losses which has improved with fluid boluses. H/H being monitored and with drop 15-->13.6. On 02/07, he developed thrombocytopenia ( plt-100) with leucocytosis (WBC 16.9)  being monitored and has resolved. Po intake improving with rise in BS. He continues to have weakness with sensory deficits affecting ADLs and mobility. Will set goals for Supervision/Min  A with PT/OT.    Due to the current state of emergency, patients may not be receiving their 3-hours of Medicare-mandated therapy.  See Team Conference Notes for weekly updates to the plan of care

## 2020-02-20 DIAGNOSIS — K921 Melena: Secondary | ICD-10-CM | POA: Diagnosis not present

## 2020-02-20 DIAGNOSIS — G610 Guillain-Barre syndrome: Secondary | ICD-10-CM | POA: Diagnosis not present

## 2020-02-20 DIAGNOSIS — K5900 Constipation, unspecified: Secondary | ICD-10-CM

## 2020-02-20 DIAGNOSIS — E871 Hypo-osmolality and hyponatremia: Secondary | ICD-10-CM

## 2020-02-20 DIAGNOSIS — E1165 Type 2 diabetes mellitus with hyperglycemia: Secondary | ICD-10-CM | POA: Diagnosis not present

## 2020-02-20 DIAGNOSIS — I10 Essential (primary) hypertension: Secondary | ICD-10-CM

## 2020-02-20 DIAGNOSIS — K567 Ileus, unspecified: Secondary | ICD-10-CM

## 2020-02-20 DIAGNOSIS — R140 Abdominal distension (gaseous): Secondary | ICD-10-CM

## 2020-02-20 DIAGNOSIS — K9189 Other postprocedural complications and disorders of digestive system: Secondary | ICD-10-CM

## 2020-02-20 DIAGNOSIS — R933 Abnormal findings on diagnostic imaging of other parts of digestive tract: Secondary | ICD-10-CM

## 2020-02-20 LAB — CBC WITH DIFFERENTIAL/PLATELET
Abs Immature Granulocytes: 0.07 10*3/uL (ref 0.00–0.07)
Basophils Absolute: 0.1 10*3/uL (ref 0.0–0.1)
Basophils Relative: 1 %
Eosinophils Absolute: 0.2 10*3/uL (ref 0.0–0.5)
Eosinophils Relative: 3 %
HCT: 37.5 % — ABNORMAL LOW (ref 39.0–52.0)
Hemoglobin: 12.7 g/dL — ABNORMAL LOW (ref 13.0–17.0)
Immature Granulocytes: 1 %
Lymphocytes Relative: 19 %
Lymphs Abs: 1.3 10*3/uL (ref 0.7–4.0)
MCH: 30.3 pg (ref 26.0–34.0)
MCHC: 33.9 g/dL (ref 30.0–36.0)
MCV: 89.5 fL (ref 80.0–100.0)
Monocytes Absolute: 1 10*3/uL (ref 0.1–1.0)
Monocytes Relative: 15 %
Neutro Abs: 4.2 10*3/uL (ref 1.7–7.7)
Neutrophils Relative %: 61 %
Platelets: 166 10*3/uL (ref 150–400)
RBC: 4.19 MIL/uL — ABNORMAL LOW (ref 4.22–5.81)
RDW: 12.2 % (ref 11.5–15.5)
WBC: 6.7 10*3/uL (ref 4.0–10.5)
nRBC: 0 % (ref 0.0–0.2)

## 2020-02-20 LAB — COMPREHENSIVE METABOLIC PANEL
ALT: 29 U/L (ref 0–44)
AST: 25 U/L (ref 15–41)
Albumin: 2 g/dL — ABNORMAL LOW (ref 3.5–5.0)
Alkaline Phosphatase: 71 U/L (ref 38–126)
Anion gap: 8 (ref 5–15)
BUN: 17 mg/dL (ref 8–23)
CO2: 25 mmol/L (ref 22–32)
Calcium: 8 mg/dL — ABNORMAL LOW (ref 8.9–10.3)
Chloride: 97 mmol/L — ABNORMAL LOW (ref 98–111)
Creatinine, Ser: 0.78 mg/dL (ref 0.61–1.24)
GFR, Estimated: >60 mL/min (ref >60)
Glucose, Bld: 177 mg/dL — ABNORMAL HIGH (ref 70–99)
Potassium: 3.9 mmol/L (ref 3.5–5.1)
Sodium: 130 mmol/L — ABNORMAL LOW (ref 135–145)
Total Bilirubin: 1 mg/dL (ref 0.3–1.2)
Total Protein: 5.6 g/dL — ABNORMAL LOW (ref 6.5–8.1)

## 2020-02-20 LAB — GLUCOSE, CAPILLARY
Glucose-Capillary: 241 mg/dL — ABNORMAL HIGH (ref 70–99)
Glucose-Capillary: 139 mg/dL — ABNORMAL HIGH (ref 70–99)
Glucose-Capillary: 151 mg/dL — ABNORMAL HIGH (ref 70–99)
Glucose-Capillary: 234 mg/dL — ABNORMAL HIGH (ref 70–99)

## 2020-02-20 MED ORDER — METHYLNALTREXONE BROMIDE 12 MG/0.6ML ~~LOC~~ SOLN
12.0000 mg | Freq: Once | SUBCUTANEOUS | Status: AC
  Administered 2020-02-20: 12 mg via SUBCUTANEOUS
  Filled 2020-02-20: qty 0.6

## 2020-02-20 MED ORDER — CHLORHEXIDINE GLUCONATE CLOTH 2 % EX PADS
6.0000 | MEDICATED_PAD | Freq: Every day | CUTANEOUS | Status: DC
  Administered 2020-02-20 – 2020-02-23 (×4): 6 via TOPICAL

## 2020-02-20 MED ORDER — MAGNESIUM OXIDE 400 (241.3 MG) MG PO TABS: 400.0000 mg | ORAL_TABLET | Freq: Every day | ORAL | Status: AC

## 2020-02-20 NOTE — Progress Notes (Signed)
[facility] Individual Statement of Services  Patient Name:  Jon Townsend  Date:  02/20/2020  Welcome to the [facility].  Our goal is to provide you with an individualized program based on your diagnosis and situation, designed to meet your specific needs.  With this comprehensive rehabilitation program, you will be expected to participate in at least 3 hours of rehabilitation therapies Monday-Friday, with modified therapy programming on the weekends.  Your rehabilitation program will include the following services:  Physical Therapy (PT), Occupational Therapy (OT), 24 hour per day rehabilitation nursing, Neuropsychology, Care Coordinator, Rehabilitation Medicine, Nutrition Services and Pharmacy Services  Weekly team conferences will be held on Wednesday to discuss your progress.  Your Inpatient Rehabilitation Care Coordinator will talk with you frequently to get your input and to update you on team discussions.  Team conferences with you and your family in attendance may also be held.  Expected length of stay: 18-21 days  Overall anticipated outcome: supervision with cueing  Depending on your progress and recovery, your program may change. Your Inpatient Rehabilitation Care Coordinator will coordinate services and will keep you informed of any changes. Your Inpatient Rehabilitation Care Coordinator's name and contact numbers are listed  below.  The following services may also be recommended but are not provided by the [facility] will be made to provide these services after discharge if needed.  Arrangements include referral to agencies that provide these services.  Your insurance has been verified to be:  [facility] primary doctor is:  Riki Sheer  Pertinent information will be shared with your  doctor and your insurance company.  Inpatient Rehabilitation Care Coordinator:  Ovidio Kin, [place] or Emilia Beck  Information discussed with and copy given to patient by: Elease Hashimoto, 02/20/2020, 1:01 PM

## 2020-02-20 NOTE — Progress Notes (Signed)
Patient ID: Jon Townsend, male   DOB: 1955-05-17, 65 y.o.   MRN: [id number] Met with the patient to review role of the nurse CM and address questions/educational needs. Reviewed collaboration with the SW to facilitate preparation for discharge.  Discussed he felt like he was being kept informed after a discussion with the gastroenterologist. Reviewed guillain-barre' syndrome and possible causes. Discussed protein energy issues with albumin level of 2.0 however given current diet and ileus issues; diet is limited and appetite is poor along with magnesium level of 2.0 and rationale for supplement.  T2DM with A1C of 7.7 monitored. Reviewed options for foods higher in proteins for now and recommendations to add as the diet is progressed. Continue to follow along to discharge to address concerns. Margarito Liner

## 2020-02-20 NOTE — Progress Notes (Signed)
Inpatient Rehabilitation Care Coordinator Assessment and Plan Patient Details  Name: Delno Blaisdell MRN: [id number] Date of Birth: 04/26/1955  Today's Date: 02/20/2020  Hospital Problems: Principal Problem:   Guillain Barr syndrome [facility])  Past Medical History:  Past Medical History:  Diagnosis Date  . Allergy   . BPH (benign prostatic hyperplasia)   . Diabetes mellitus type 2 in obese ([place])   . Glaucoma   . Hypertension    Past Surgical History:  Past Surgical History:  Procedure Laterality Date  . Broken finger    . TONSILLECTOMY     Social History:  reports that he has quit smoking. He has never used smokeless tobacco. He reports that he does not drink alcohol and does not use drugs.  Family / Support Systems Marital Status: Married Patient Roles: Spouse,Parent Spouse/Significant Other: Teresa-wife [phone] was recently here 10/4 after femur fracture Children: Brooke Teeter-daughter [phone] Other Supports: Friends Anticipated Caregiver: Helene Kelp still recovering from femur fracture uses a cane. She can provide supervision level due to she continues to use a cane for her ambulation Ability/Limitations of Caregiver: Supervision-some min with ADL's Caregiver Availability: 24/7 Family Dynamics: Close knit family they help one another. Pt took time off to assist wife when discharged from CIR. Now wife and daughter will assist him until he recovers. Pt recently retired one week prior to admission  Social History Preferred language: English Religion:  Cultural Background: No issues Education: HS Read: Yes Write: Yes Employment Status: Retired Date Retired/Disabled/Unemployed: One week prior to admission Age Retired: 13 Legal History/Current Legal Issues: No issues Guardian/Conservator: None-according to MD pt is capable of making his own decisions while here. Wants wife involved and she will be here to visit and provide support   Abuse/Neglect Abuse/Neglect  Assessment Can Be Completed: Yes Physical Abuse: Denies Verbal Abuse: Denies Sexual Abuse: Denies Exploitation of patient/patient's resources: Denies Self-Neglect: Denies  Emotional Status Pt's affect, behavior and adjustment status: Pt has always been independent and taken car eof himself, this has thrown him for a loop. He has never experienced anything such as this with his sensory issues. He hopes to do well here and recover from this Recent Psychosocial Issues: had back pain but managed prior to admission did take pain meds for this Psychiatric History: NO history deferred depresssion screening due to first day on rehab and adjusting to the program. Do feel he would benefit from seeing neuro-psych while here due to quickness of loss of mobility and deficits. Substance Abuse History: No issues  Patient / Family Perceptions, Expectations & Goals Pt/Family understanding of illness & functional limitations: Pt and wife can explain his GBS and talk with the MD's involved. He is hoiping he will do well on rehab and not have to be here long. He does talk with the MD and feels he has a good understanding of his treatment plan going forward. Premorbid pt/family roles/activities: Husband, father, grandfather, retiree, church member, etc Anticipated changes in roles/activities/participation: resume Pt/family expectations/goals: Pt states: " I hope to be here a short time and do well I do no like hospitals"  Wife states: " I hope he does well and am glad he is on rehab."  [employer]: Other (Comment) Alvis Lemmings was seeing prior to admission) Premorbid Home Care/DME Agencies: Other (Comment) (wife has rw, bsc, tub seat from past admission) Transportation available at discharge: wife and daughter-pt drove PTA Resource referrals recommended: Neuropsychology  Discharge Planning Living Arrangements: Spouse/significant other Support Systems: Spouse/significant  other,Children,Church/faith community Type of  Residence: Private residence Insurance Resources: Multimedia programmer (specify) Scientist, clinical (histocompatibility and immunogenetics) Medicare) Financial Resources: Social Security,Family Support Financial Screen Referred: No Living Expenses: Own Money Management: Patient,Spouse Does the patient have any problems obtaining your medications?: No Home Management: Both he and wife he basically since she had a femur fracture Patient/Family Preliminary Plans: Return home with wife who is recovering from her femur fracture and still using a cane to ambulate. She was on rehab in 10/2019. Pt assisted with her care. Their daughter will check in and assist as needed, but main caregiver is his wife who can do supervision level and some min ADL's. Aware being evaluated today and goals set. Care Coordinator Barriers to Discharge: Decreased caregiver support Care Coordinator Barriers to Discharge Comments: Wife still recovering from femur fracture 10/21 uses cane Care Coordinator Anticipated Follow Up Needs: HH/OP  Clinical Impression Pleasant but frustrated with yet another issue-he has an ileus. His wife was recently here as a patient in 10/2019 and still ambulates with a cane. Their daughter is a Radio broadcast assistant and will assist when not working. Will await evaluations and work on discharge needs. Aware of program due to wife being here in 10/2019  Elease Hashimoto 02/20/2020, 9:46 AM

## 2020-02-20 NOTE — Progress Notes (Signed)
[facility] PHYSICAL MEDICINE & REHABILITATION PROGRESS NOTE  Subjective/Complaints: Patient seen sitting up in bed this morning.  He states he did not sleep well overnight due to having to have a bowel movement as well as being restless.  He has tingling in his bilateral feet and hands.  ROS: + Tingling in bilateral hands and feet. Denies CP, SOB, N/V/D  Objective: Vital Signs: Blood pressure 120/73, pulse 89, temperature 98.7 F (37.1 C), resp. rate 14, height 6\' 1"  (1.854 m), weight 108.7 kg, SpO2 95 %. DG Abd 1 View  Result Date: 02/19/2020 CLINICAL DATA:  Abdominal distension EXAM: ABDOMEN - 1 VIEW COMPARISON:  02/14/2020 FINDINGS: Numerous gas-filled nondilated loops of large and small bowel are seen throughout the abdomen most in keeping with an mild adynamic ileus. There is no gross free intraperitoneal gas. No organomegaly. Degenerative changes are seen within the lumbar spine. No abnormal calcifications within the abdomen. IMPRESSION: Mild ileus Electronically Signed   By: Fidela Salisbury MD   On: 02/19/2020 16:08   Recent Labs    02/19/20 0255 02/20/20 0451  WBC 8.9 6.7  HGB 13.6 12.7*  HCT 38.9* 37.5*  PLT 154 166   Recent Labs    02/19/20 0255 02/20/20 0451  NA 133* 130*  K 4.0 3.9  CL 96* 97*  CO2 27 25  GLUCOSE 140* 177*  BUN 14 17  CREATININE 0.83 0.78  CALCIUM 8.5* 8.0*    Intake/Output Summary (Last 24 hours) at 02/20/2020 0951 Last data filed at 02/20/2020 4166 Gross per 24 hour  Intake 240 ml  Output 1950 ml  Net -1710 ml        Physical Exam: BP 120/73 (BP Location: Right Arm)   Pulse 89   Temp 98.7 F (37.1 C)   Resp 14   Ht 6\' 1"  (1.854 m)   Wt 108.7 kg   SpO2 95%   BMI 31.62 kg/m  Constitutional: No distress . Vital signs reviewed.  Obese. HENT: Normocephalic.  Atraumatic. Eyes: EOMI. No discharge. Cardiovascular: No JVD.  RRR. Respiratory: Normal effort.  No stridor.  Bilateral clear to auscultation. GI: Distended.  BS +. Skin:  Warm and dry.  Intact. Psych: Normal mood.  Normal behavior. Musc: No edema in extremities.  No tenderness in extremities. Neuro: Alert Motor: Bilateral upper extremities: 5/5 proximal distally  Bilateral lower extremities: Hip flexion, knee extension 4-55, ankle dorsiflexion 4/5  Assessment/Plan: 1. Functional deficits which require 3+ hours per day of interdisciplinary therapy in a comprehensive inpatient rehab setting.  Physiatrist is providing close team supervision and 24 hour management of active medical problems listed below.  Physiatrist and rehab team continue to assess barriers to discharge/monitor patient progress toward functional and medical goals   Care Tool:  Bathing              Bathing assist       Upper Body Dressing/Undressing Upper body dressing   What is the patient wearing?: Hospital gown only    Upper body assist Assist Level: Moderate Assistance - Patient 50 - 74%    Lower Body Dressing/Undressing Lower body dressing      What is the patient wearing?: Incontinence brief     Lower body assist Assist for lower body dressing: Moderate Assistance - Patient 50 - 74%     Toileting Toileting    Toileting assist Assist for toileting: Maximal Assistance - Patient 25 - 49%     Transfers Chair/bed transfer  Transfers assist  Locomotion Ambulation   Ambulation assist              Walk 10 feet activity   Assist           Walk 50 feet activity   Assist           Walk 150 feet activity   Assist           Walk 10 feet on uneven surface  activity   Assist           Wheelchair     Assist               Wheelchair 50 feet with 2 turns activity    Assist            Wheelchair 150 feet activity     Assist           Medical Problem List and Plan: 1.  Impaired strength, ADLs, function, mobility secondary to Guillain Barre syndrome from COVID- 2nd time having  COIVD  Begin CIR evaluations 2.  Antithrombotics: -DVT/anticoagulation:  Pharmaceutical: Lovenox  -antiplatelet therapy: N/A 3. Pain Management:Increased gabapentin to 300 mg TID due to nerve pain             Continue oxycodone prn  Monitor with increased exertion 4. Mood: LCSW to follow for evaluation and support.              -antipsychotic agents: N/A 5. Neuropsych: This patient is capable of making decisions on her own behalf. 6. Skin/Wound Care: Routine pressure relief measures. 7. Fluids/Electrolytes/Nutrition: Monitor I/Os.  8.  Ileus             KUB showing ileus  IV Reglan  Patient initially made n.p.o., however threatening to leave AMA.  Diet was not initiated-started on liquid diet overnight  See #13 9. HTN: Monitor BP tid--continue Norvasc and lopressor  Monitor with increased mobility 10. H/o BPH/Neurogenic bladder:   Increased Flomax to 0.8 mg daily  Plan to DC Foley after improvement in ileus 11. T2DM with hyperglycemia: Monitor BS ac/hs             Metformin once daily for now as intake starting to improve.              Monitor with increased ambulation 12. Hyponatremia:   Sodium 130 on 2/11, labs ordered for Monday 13. Bloody stools:   Hemoccult positive  Hemoglobin 12.7 on 2/11, labs ordered for Monday  Will consult GI 14. Anorexia/ Low protein stores: Add prostat and offer Ensure Max if tolerated. 15. Eye infection: Symptoms improving but still erythematous              Continue allegra and polytrim eye drops   LOS: 1 days A FACE TO FACE EVALUATION WAS PERFORMED  Jon Townsend 02/20/2020, 9:51 AM

## 2020-02-20 NOTE — Progress Notes (Signed)
Occupational Therapy Session Note  Patient Details  Name: Jon Townsend MRN: [id number] Date of Birth: 1955/11/13  Today's Date: 02/20/2020 OT Individual Time: 6256-3893 OT Individual Time Calculation (min): 38 min  and Today's Date: 02/20/2020 OT Missed Time: 22 Minutes Missed Time Reason: Patient fatigue   Short Term Goals: Week 1:  OT Short Term Goal 1 (Week 1): Pt will complete sit > stand mod assist of one caregiver in preparation for LB dressing OT Short Term Goal 2 (Week 1): Pt will complete LB dressing at sit > stand level with mod assist OT Short Term Goal 3 (Week 1): Pt will complete toilet transfer with mod assist of one caregiver OT Short Term Goal 4 (Week 1): Pt will complete toileting with max assist of one caregiver  Skilled Therapeutic Interventions/Progress Updates:  Treatment session with focus on functional mobility and transfers.  Pt received supine in bed reporting "I'm not" when asked how he was doing.  Pt reports fatigue and weakness, reporting numbness in B feet from sitting up in w/c after PT session.  Pt reports frustrated with slow progress but requesting to attempt ambulation this session.  Pt completed bed mobility with CGA due to ataxia in all 4 extremities.  Completed sit > stand with mod assist +2 from elevated EOB with RW.  Pt ambulated 25' with RW, min-mod assist and close w/c follow.  Pt then completed 20' in same manner, reporting "spongy" legs therefore encouraged to sit.  Pt pleased with progress today, after slow start this AM.  Discussed POC in inpatient rehab with increased daily therapy and intensity.  Pt completed stand pivot transfer mod assist w/c > bed with RW.  Returned to supine with assist to advance RLE in to bed.  Pt able to pull self up in bed.  Engaged in [place] for BLE and core strengthening while engaging in hygiene as pt felt incontinent BM.  However upon assessment, pt not incontinent of bowel.  Therefore rolled side to side to adjust  incontinence brief and shorts.  Pt remained semi-reclined in bed with all needs in reach.  Therapy Documentation Precautions:  Precautions Precautions: Fall Precaution Comments: off COVID precautions as of 02/13/20 Restrictions Weight Bearing Restrictions: No General: General OT Amount of Missed Time: 22 Minutes PT Missed Treatment Reason: Patient fatigue Vital Signs: Therapy Vitals Temp: 98 F (36.7 C) Pulse Rate: 86 Resp: 15 BP: 135/76 Patient Position (if appropriate): Lying Oxygen Therapy SpO2: 94 % O2 Device: Room Air Pain:  Pt with no c/o pain, just "numbness" in B feet.   Therapy/Group: Individual Therapy  Simonne Come 02/20/2020, 3:02 PM

## 2020-02-20 NOTE — Evaluation (Signed)
Physical Therapy Assessment and Plan  Patient Details  Name: Jon Townsend MRN: [id number] Date of Birth: [birth year]  PT Diagnosis: Abnormal posture, Abnormality of gait, Difficulty walking, Impaired sensation and Muscle weakness Rehab Potential: Good ELOS: 18-21 days   Today's Date: 02/20/2020 PT Individual Time: 1045-1140 PT Individual Time Calculation (min): 55 min  and Today's Date: 02/20/2020 PT Missed Time: 20 Minutes Missed Time Reason: Patient fatigue   Hospital Problem: Principal Problem:   Guillain Barr syndrome ([place]) Active Problems:   Bloody stool   Hyponatremia   Controlled type 2 diabetes mellitus with hyperglycemia, without long-term current use of insulin (HCC)   Ileus, postoperative ([place])   Past Medical History:  Past Medical History:  Diagnosis Date  . Allergy   . BPH (benign prostatic hyperplasia)   . Diabetes mellitus type 2 in obese ([place])   . Glaucoma   . Hypertension    Past Surgical History:  Past Surgical History:  Procedure Laterality Date  . Broken finger    . TONSILLECTOMY      Assessment & Plan Clinical Impression: Patient is a 65 y.o. year old male with history of T2DM, HTN, BPH who presented to ED on 02/02/29 with 48 hours history of progressive weakness --BLE>BUE and paresthesias, numbness bilateral hands/feet and inability to walk. He was found to be Covid + and reported having HA, URI symptoms and anorexia. Work up revealed areflexia with tetraplegia --attempts at LP unsuccessful and patient treated with IVIG X 5 doses for symptoms consistent with GBS in setting of Covid infection. MRI spine showed degenerative spondylosis with foraminal narrowing C5/C6, Multilevel degenerative spondylosis lumbar spine with moderate spinal stenosis L2/3- L3/4. LBP treated with local measures and recommendations to avoid muscle relaxers per neurology. Physical Medicine & Rehabilitation was consulted to assess candidacy for CIRgiven diffuse weakness and  numbness.  Patient currently requires mod with mobility secondary to muscle weakness, decreased cardiorespiratoy endurance, unbalanced muscle activation, decreased coordination and decreased motor planning, decreased motor planning and decreased standing balance, decreased postural control and decreased balance strategies.  Prior to hospitalization, patient was independent  with mobility and lived with Spouse in a House home.  Home access is 2Stairs to enter.  Patient will benefit from skilled PT intervention to maximize safe functional mobility, minimize fall risk and decrease caregiver burden for planned discharge home with 24 hour supervision.  Anticipate patient will benefit from follow up [place] at discharge.  PT - End of Session Activity Tolerance: Tolerates 30+ min activity with multiple rests Endurance Deficit: Yes Endurance Deficit Description: pt reported 20/10 fatigue during session PT Assessment Rehab Potential (ACUTE/IP ONLY): Good PT Barriers to Discharge: Inaccessible home environment;Decreased caregiver support;Home environment access/layout PT Barriers to Discharge Comments: 2 STE with 0 rails; pt wife is recovering from hip fx PT Patient demonstrates impairments in the following area(s): Balance;Endurance;Motor;Perception;Sensory PT Transfers Functional Problem(s): Bed Mobility;Bed to Chair;Car;Furniture PT Locomotion Functional Problem(s): Ambulation;Wheelchair Mobility;Stairs PT Plan PT Intensity: Minimum of 1-2 x/day ,45 to 90 minutes PT Frequency: 5 out of 7 days PT Duration Estimated Length of Stay: 18-21 days PT Treatment/Interventions: Ambulation/gait training;Discharge planning;Functional mobility training;Psychosocial support;Therapeutic Activities;Balance/vestibular training;Disease management/prevention;Neuromuscular re-education;Skin care/wound management;Therapeutic Exercise;Wheelchair propulsion/positioning;DME/adaptive equipment instruction;Pain  management;Splinting/orthotics;UE/LE Strength taining/ROM;Community reintegration;Functional electrical stimulation;Patient/family education;Stair training;UE/LE Coordination activities PT Transfers Anticipated Outcome(s): supervision with LRAD PT Locomotion Anticipated Outcome(s): supervision with LRAD PT Recommendation Recommendations for Other Services: Therapeutic Recreation consult;Neuropsych consult Therapeutic Recreation Interventions: Stress management Follow Up Recommendations: Home health PT Patient destination: Home Equipment Recommended: To be determined  PT Evaluation Precautions/Restrictions Precautions Precautions: Fall Precaution Comments: off COVID precautions as of 02/13/20 Restrictions Weight Bearing Restrictions: No Home Living/Prior Functioning Home Living Living Arrangements: Spouse/significant other Available Help at Discharge: Family;Available 24 hours/day Type of Home: House Home Access: Stairs to enter [employer] of Steps: 2 Entrance Stairs-Rails: None Home Layout: Two level;Able to live on main level with bedroom/bathroom Alternate Level Stairs-Number of Steps: flight Bathroom Shower/Tub: Walk-in shower (has a shower seat) Bathroom Toilet: Standard Bathroom Accessibility: Yes Additional Comments: spouse with recent hip fx, still utilizing cane to ambulate, cannot provide physical assistance.  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 Coordination Gross Motor Movements are Fluid and Coordinated: No Fine Motor Movements are Fluid and Coordinated: No Coordination and Movement Description: grossly uncoordinated due to generalized weakness, poor endurance, and decreased balance/postural control Finger Nose Finger Test: mild dysmetria in RUE Heel Shin Test: decreased ROM bilaterally due to weakness; has to use BUE to assist Motor  Motor Motor: Abnormal postural alignment and control Motor - Skilled Clinical Observations: grossly uncoordinated due to generalized weakness, poor endurance, and decreased balance/postural control  Trunk/Postural Assessment  Cervical Assessment Cervical Assessment: Within Functional Limits Thoracic Assessment Thoracic Assessment: Exceptions to [facility] (rounded shoulders) Lumbar Assessment Lumbar Assessment: Exceptions to [facility] (posterior pelvic tilt) Postural Control Postural Control: Deficits on evaluation  Balance Balance Balance Assessed: Yes Static Sitting Balance Static Sitting - Balance Support: Feet supported;Bilateral upper extremity supported Static Sitting - Level of Assistance: 5: Stand by assistance (supervision) Dynamic Sitting Balance Dynamic Sitting - Balance Support: Feet supported;Bilateral upper extremity supported Dynamic Sitting - Level of Assistance: 5: Stand by assistance (supervision) Static Standing Balance Static Standing - Balance Support: Bilateral upper extremity supported (RW) Static Standing - Level of Assistance: 4: Min assist Dynamic Standing Balance Dynamic Standing - Balance Support: Bilateral upper extremity supported (RW) Dynamic Standing - Level of Assistance: 3: Mod assist Dynamic Standing - Comments: with transfers Extremity Assessment  RLE  Assessment RLE Assessment: Exceptions to [facility] RLE Strength Right Hip Flexion: 3+/5 Right Hip ABduction: 3+/5 Right Hip ADduction: 3/5 Right Knee Flexion: 3+/5 Right Knee Extension: 3+/5 Right Ankle Dorsiflexion: 4-/5 Right Ankle Plantar Flexion: 4-/5 LLE Assessment LLE Assessment: Exceptions to [facility] LLE Strength Left Hip Flexion: 3+/5 Left Hip ABduction: 3/5 Left Hip ADduction: 3+/5 Left Knee Flexion: 3+/5 Left Knee Extension: 3+/5 Left Ankle Dorsiflexion: 4-/5 Left Ankle Plantar Flexion: 4-/5  Care Tool Care Tool Bed Mobility Roll left and right activity   Roll left and right assist level: Supervision/Verbal cueing    Sit to lying activity   Sit to lying assist level: Minimal Assistance - Patient > 75%    Lying to sitting edge of bed activity   Lying to sitting edge of bed assist level: Contact Guard/Touching assist     Care Tool Transfers Sit to stand transfer   Sit to stand assist level: Maximal Assistance - Patient 25 - 49%    Chair/bed transfer   Chair/bed transfer assist level: Moderate Assistance - Patient 50 -  74% (RW)     Psychologist, counselling transfer activity did not occur: Safety/medical concerns (fatigue, generalized weakness, poor endurance)        Care Tool Locomotion Ambulation Ambulation activity did not occur: Safety/medical concerns (fatigue, generalized weakness, poor endurance)        Walk 10 feet activity Walk 10 feet activity did not occur: Safety/medical concerns (fatigue, generalized weakness, poor endurance)       Walk 50 feet with 2 turns activity Walk 50 feet with 2 turns activity did not occur: Safety/medical concerns (fatigue, generalized weakness, poor endurance)      Walk 150 feet activity Walk 150 feet activity did not occur: Safety/medical concerns (fatigue, generalized weakness, poor endurance)      Walk 10 feet on uneven surfaces activity Walk 10 feet on uneven surfaces activity did not occur:  Safety/medical concerns (fatigue, generalized weakness, poor endurance)      Stairs Stair activity did not occur: Safety/medical concerns (fatigue, generalized weakness, poor endurance)        Walk up/down 1 step activity Walk up/down 1 step or curb (drop down) activity did not occur: Safety/medical concerns (fatigue, generalized weakness, poor endurance)     Walk up/down 4 steps activity did not occuR: Safety/medical concerns (fatigue, generalized weakness, poor endurance)  Walk up/down 4 steps activity      Walk up/down 12 steps activity Walk up/down 12 steps activity did not occur: Safety/medical concerns (fatigue, generalized weakness, poor endurance)      Pick up small objects from floor Pick up small object from the floor (from standing position) activity did not occur: Safety/medical concerns (fatigue, generalized weakness, poor endurance)      Wheelchair Will patient use wheelchair at discharge?: Yes Type of Wheelchair: Manual Wheelchair activity did not occur: Safety/medical concerns (fatigue, generalized weakness, poor endurance)      Wheel 50 feet with 2 turns activity Wheelchair 50 feet with 2 turns activity did not occur: Safety/medical concerns (fatigue, generalized weakness, poor endurance)    Wheel 150 feet activity Wheelchair 150 feet activity did not occur: Safety/medical concerns (fatigue, generalized weakness, poor endurance)      Refer to Care Plan for Long Term Goals  SHORT TERM GOAL WEEK 1 PT Short Term Goal 1 (Week 1): pt will perform all bed mobility with CGA PT Short Term Goal 2 (Week 1): pt will transfer sit<>stand with LRAD and min A PT Short Term Goal 3 (Week 1): pt will transfer bed<>chair with LRAD and min A  Recommendations for other services: Neuropsych and Therapeutic Recreation  Stress management  Skilled Therapeutic Intervention Evaluation completed (see details above and below) with education on PT POC and goals and individual treatment  initiated with focus on functional mobility/transfers, generalized strengthening, dynamic standing balance/coordination, and improved activity tolerance. Received pt supine in bed, pt educated on PT evaluation, CIR policies, and therapy schedule and agreeable. Pt denied any pain during session but reported numbness, tingling, and coldness in bilateral hands and feet. Therapist provided pt with 20x18 manual WC. Pt required convincing to get dressed and donned pants in supine with mod A to thread LEs through and rolled L and R with supervision and use of bedrails and required max/total A to pull pants over hips. Pt transferred supine<>sitting EOB with CGA and doffed dirty gown and donned clean pull over shirt with supervision. Pt transferred sit<>stand without AD and max A but was unable to come to complete stand and  returned to sitting. Pt transferred sit<>stand with RW and heavy mod A and then reported having soiled his brief. Sit<>supine with min A for RLE management and doffed pants and brief with max/total A. Pt required total A for peri-care and donned clean brief and pants in same manner listed above. Supine<>sit with CGA and stand<>pivot transfer to [facility] with RW and mod A with mild R knee buckling. Pt reported 20/10 fatigue and requested to end session and remain in [facility] so that he is able to participate in OT this afternoon. Concluded session with pt sitting in WC, needs within reach, and seatbelt alarm on. NT at bedside changing linens. 20 minutes missed of skilled physical therapy due to fatigue.   Mobility Bed Mobility Bed Mobility: Rolling Right;Rolling Left;Supine to Sit;Sit to Supine Rolling Right: Supervision/verbal cueing Rolling Left: Supervision/Verbal cueing Supine to Sit: Contact Guard/Touching assist Sit to Supine: Minimal Assistance - Patient > 75% Transfers Transfers: Sit to Stand;Stand to Sit;Stand Pivot Transfers Sit to Stand: Maximal Assistance - Patient 25-49% Stand to Sit: Moderate  Assistance - Patient 50-74% Stand Pivot Transfers: Moderate Assistance - Patient 50 - 74% Stand Pivot Transfer Details: Verbal cues for safe use of DME/AE;Verbal cues for technique Stand Pivot Transfer Details (indicate cue type and reason): verbal and tactile cues for RW management and turning technique. Transfer Programmer, multimedia): Museum/gallery exhibitions officer: Animal nutritionist: No Gait Gait: No Stairs / Additional Locomotion Stairs: No Product manager Mobility: No  Discharge Criteria: Patient will be discharged from PT if patient refuses treatment 3 consecutive times without medical reason, if treatment goals not met, if there is a change in medical status, if patient makes no progress towards goals or if patient is discharged from hospital.  The above assessment, treatment plan, treatment alternatives and goals were discussed and mutually agreed upon: by patient and by family  Alfonse Alpers PT, DPT  02/20/2020, 11:50 AM

## 2020-02-20 NOTE — Evaluation (Signed)
Occupational Therapy Assessment and Plan  Patient Details  Name: Jon Townsend MRN: [id number] Date of Birth: Nov 23, 1955  OT Diagnosis: acute pain, ataxia and muscle weakness (generalized) Rehab Potential: Rehab Potential (ACUTE ONLY): Good ELOS: 18-21 days   Today's Date: 02/20/2020 OT Individual Time: 1025-8527 OT Individual Time Calculation (min): 60 min     Hospital Problem: Principal Problem:   Guillain Barr syndrome ([place]) Active Problems:   Bloody stool   Hyponatremia   Controlled type 2 diabetes mellitus with hyperglycemia, without long-term current use of insulin ([facility])   Ileus, postoperative ([place])   Past Medical History:  Past Medical History:  Diagnosis Date  . Allergy   . BPH (benign prostatic hyperplasia)   . Diabetes mellitus type 2 in obese ([place])   . Glaucoma   . Hypertension    Past Surgical History:  Past Surgical History:  Procedure Laterality Date  . Broken finger    . TONSILLECTOMY      Assessment & Plan Clinical Impression: Patient is a 65 y.o. year old male with history of T2DM, HTN, BPH who presented toED on 02/02/29 with 48 hours of progressive weakness BLE>BUE and paraesthesias, numbness bilateral hands and feet and inability to walk. He was found to be Covid + and reported having HA, URI symptoms as well as anorexia. Work up revealed areflexia with tetraplegia concerning for GBS and attempts at LP unsuccessful. He received IVIG X 5 doses as neurology felt that patient with GBS in setting of Covid infection. MRI spine done as part of work up an showed degenerative spondylosis with foraminal stenosis C5/C6, multilevel degenerative spondylosis lumbar spine with moderate spinal stenosis L2/3-L3/4 --incidental soft tisuse edema noted within posterior R>L paraspinal musculature most likely related to LP attempts.    Hyponatremia managed with fluid restriction. Foley placed due to urinary retention and remains in place. He had issues with severe  constipation and KUB showed concerns of ileus. CT abdomen/pelvis ordered for work up due to abdominal pain with blood in stool. This revealed small bowel thickening question chronic v/s due to colitis, small amount of stool in colon and diffuse hepatic steatosis with hepatic cysts.  He continued to have diarrhea with resultant worsening of hyponatremia with AKI due to GI losses which has improved with fluid boluses. H/H being monitored and with drop 15-->13.6. On 02/07, he developed thrombocytopenia ( plt-100) with leucocytosis (WBC 16.9)  being monitored and has resolved. Po intake improving with rise in BS. He continues to have weakness with sensory deficits affecting ADLs and mobility. CIR recommended due to functional decline.  Patient transferred to CIR on 02/19/2020 .    Patient currently requires mod +2 with basic self-care skills secondary to muscle weakness, decreased cardiorespiratoy endurance, impaired timing and sequencing, ataxia and decreased motor planning and decreased standing balance and decreased balance strategies.  Prior to hospitalization, patient could complete ADLs with independent .  Patient will benefit from skilled intervention to decrease level of assist with basic self-care skills prior to discharge home with care partner.  Anticipate patient will require 24 hour supervision and follow up home health.  OT - End of Session Activity Tolerance: Tolerates 30+ min activity with multiple rests Endurance Deficit: Yes Endurance Deficit Description: pt reported 20/10 fatigue during session OT Assessment Rehab Potential (ACUTE ONLY): Good OT Barriers to Discharge: Decreased caregiver support;Incontinence OT Patient demonstrates impairments in the following area(s): Balance;Endurance;Motor;Pain;Perception;Safety;Sensory;Skin Integrity OT Basic ADL's Functional Problem(s): Grooming;Bathing;Dressing;Toileting OT Transfers Functional Problem(s): Toilet;Tub/Shower OT Additional  Impairment(s): Fuctional Use of  Upper Extremity OT Plan OT Intensity: Minimum of 1-2 x/day, 45 to 90 minutes OT Frequency: 5 out of 7 days OT Duration/Estimated Length of Stay: 18-21 days OT Treatment/Interventions: Medical illustrator training;Community reintegration;Discharge planning;Disease mangement/prevention;DME/adaptive equipment instruction;Functional mobility training;Functional electrical stimulation;Neuromuscular re-education;Pain management;Patient/family education;Psychosocial support;Self Care/advanced ADL retraining;Skin care/wound managment;Therapeutic Activities;Therapeutic Exercise;UE/LE Strength taining/ROM;UE/LE Coordination activities OT Basic Self-Care Anticipated Outcome(s): Supervision OT Toileting Anticipated Outcome(s): Supervision OT Bathroom Transfers Anticipated Outcome(s): Supervision OT Recommendation Recommendations for Other Services: Neuropsych consult Patient destination: Home Follow Up Recommendations: Home health OT;24 hour supervision/assistance Equipment Recommended: To be determined Equipment Details: reports having shower seat and 3 in 1 from wife previous surgeries   OT Evaluation Precautions/Restrictions  Precautions Precautions: Fall Precaution Comments: off COVID precautions as of 02/13/20 Restrictions Weight Bearing Restrictions: No General PT Missed Treatment Reason: Patient fatigue Vital Signs   Pain Pain Assessment Pain Scale: 0-10 Pain Score: 5  Pain Type: Acute pain Pain Location: Foot Pain Orientation: Right;Left Pain Descriptors / Indicators: Aching Pain Onset: On-going Pain Intervention(s): RN made aware;Repositioned Home Living/Prior Functioning Home Living Family/patient expects to be discharged to:: Private residence Living Arrangements: Spouse/significant other Available Help at Discharge: Family,Available 24 hours/day Type of Home: House Home Access: Stairs to enter [employer] of Steps: 2 Entrance  Stairs-Rails: None Home Layout: Two level,Able to live on main level with bedroom/bathroom Bathroom Shower/Tub: Walk-in shower (has a shower seat) Bathroom Toilet: Standard Bathroom Accessibility: Yes Additional Comments: spouse with recent hip fx, still utilizing cane to ambulate, cannot provide physical assistance.  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, mining Baseline Vision/History: Wears glasses Wears Glasses: At all times Patient Visual Report: Diplopia;Blurring of vision (reports it "comes and goes") Vision Assessment?: No apparent visual deficits Perception    Praxis   Cognition Overall Cognitive Status: Within Functional Limits for tasks assessed Arousal/Alertness: Awake/alert Orientation Level: Person;Place;Situation Person: Oriented Place: Oriented Situation: Oriented Year: 2022 Month: February Day of Week: Correct Immediate Memory Recall: Sock;Blue;Bed Memory Recall Sock: With Cue Memory Recall Blue: Without Cue Memory Recall Bed: Without Cue Attention: Selective Selective Attention: Appears intact Awareness: Impaired Awareness Impairment: Emergent impairment Problem Solving: Appears intact Behaviors: Poor frustration tolerance Safety/Judgment: Appears intact Sensation Sensation Light Touch: Appears Intact Proprioception: Impaired by gross assessment Additional Comments: pt reports numbness, tingling, and coldness in bilateral hands and feet. Coordination Gross Motor Movements are Fluid and Coordinated: No Fine Motor Movements are Fluid and Coordinated:  No Coordination and Movement Description: grossly uncoordinated due to generalized weakness, poor endurance, and decreased balance/postural control Finger Nose Finger Test: mild dysmetria in BUE with intention tremor Heel Shin Test: decreased ROM bilaterally due to weakness; has to use BUE to assist Motor  Motor Motor: Abnormal postural alignment and control Motor - Skilled Clinical Observations: grossly uncoordinated due to generalized weakness, poor endurance, and decreased balance/postural control  Trunk/Postural Assessment  Cervical Assessment Cervical Assessment: Within Functional Limits Thoracic Assessment Thoracic Assessment: Exceptions to [facility] (rounded shoulders) Lumbar Assessment Lumbar Assessment: Exceptions to [facility] (posterior pelvic tilt) Postural Control Postural Control: Deficits on evaluation  Balance Balance Balance Assessed: Yes Static Sitting Balance Static Sitting - Balance Support: Feet supported;Bilateral upper extremity supported Static Sitting - Level of Assistance: 5: Stand  by assistance (supervision) Dynamic Sitting Balance Dynamic Sitting - Balance Support: Feet supported;Bilateral upper extremity supported Dynamic Sitting - Level of Assistance: 5: Stand by assistance (supervision) Static Standing Balance Static Standing - Balance Support: Bilateral upper extremity supported (RW) Static Standing - Level of Assistance: 4: Min assist Dynamic Standing Balance Dynamic Standing - Balance Support: Bilateral upper extremity supported (RW) Dynamic Standing - Level of Assistance: 3: Mod assist Dynamic Standing - Comments: with transfers Extremity/Trunk Assessment RUE Assessment RUE Assessment: Within Functional Limits Active Range of Motion (AROM) Comments: WNL General Strength Comments: 4+/5 LUE Assessment LUE Assessment: Within Functional Limits Active Range of Motion (AROM) Comments: WNL General Strength Comments: 4+/5  Care Tool Care Tool Self  Care Eating        Oral Care    Oral Care Assist Level: Set up assist    Bathing   Body parts bathed by patient: Right arm;Left arm;Chest;Abdomen;Face Body parts bathed by helper: Front perineal area;Buttocks;Right upper leg;Left upper leg;Right lower leg;Left lower leg   Assist Level: 2 Helpers    Upper Body Dressing(including orthotics)   What is the patient wearing?: Pull over shirt   Assist Level: Supervision/Verbal cueing    Lower Body Dressing (excluding footwear)   What is the patient wearing?: Incontinence brief Assist for lower body dressing: 2 Helpers    Putting on/Taking off footwear   What is the patient wearing?: Non-skid slipper socks Assist for footwear: Total Assistance - Patient < 25%       Care Tool Toileting Toileting activity         Care Tool Bed Mobility Roll left and right activity   Roll left and right assist level: Supervision/Verbal cueing    Sit to lying activity   Sit to lying assist level: Minimal Assistance - Patient > 75%    Lying to sitting edge of bed activity   Lying to sitting edge of bed assist level: Contact Guard/Touching assist     Care Tool Transfers Sit to stand transfer   Sit to stand assist level: 2 Helpers    Chair/bed transfer   Chair/bed transfer assist level: Moderate Assistance - Patient 50 - 74% (RW)     Toilet transfer         Care Tool Cognition Expression of Ideas and Wants Expression of Ideas and Wants: Without difficulty (complex and basic) - expresses complex messages without difficulty and with speech that is clear and easy to understand   Understanding Verbal and Non-Verbal Content Understanding Verbal and Non-Verbal Content: Understands (complex and basic) - clear comprehension without cues or repetitions   Memory/Recall Ability *first 3 days only Memory/Recall Ability *first 3 days only: Current season;Location of own room;Staff names and faces;That he or she is in a hospital/hospital unit     Refer to Care Plan for [place] 1 OT Short Term Goal 1 (Week 1): Pt will complete sit > stand mod assist of one caregiver in preparation for LB dressing OT Short Term Goal 2 (Week 1): Pt will complete LB dressing at sit > stand level with mod assist OT Short Term Goal 3 (Week 1): Pt will complete toilet transfer with mod assist of one caregiver OT Short Term Goal 4 (Week 1): Pt will complete toileting with max assist of one caregiver  Recommendations for other services: Neuropsych   Skilled Therapeutic Intervention OT eval completed with discussion of rehab process, OT purpose, POC, ELOS, and goals.  ADL assessment completed at EOB  with focus on sit > stand, dynamic sitting balance, static standing balance during self-care tasks of bathing and dressing.  Pt received semi-reclined in bed agreeable to therapy session.  Pt declined donning clothing this session due to frequency of bowel accidents and foley catheter. Engaged in bed mobility with CGA due to ataxia in all 4 extremities.  Pt able to complete UB bathing with setup assist.  Completed sit > stand from EOB with mod assist +2 to RW.  Pt maintained static standing balance with min assist of one while 2nd person completed hygiene as pt incontinent of watery stool.  Pt reports BLE fatigue but able to stand 60-90 seconds for hygiene and donning clean brief. Pt returned to sitting and donned clean gown.  Pt completed oral care seated at EOB with setup for items as pt declining transfer OOB this session. Pt then returned to supine with assist to lift RLE in to bed.  Pt remained semi-reclined in bed with all needs in reach.   ADL ADL Grooming: Setup Where Assessed-Grooming: Edge of bed Upper Body Bathing: Setup;Supervision/safety Where Assessed-Upper Body Bathing: Edge of bed Lower Body Bathing: Dependent (+2) Where Assessed-Lower Body Bathing: Edge of bed Upper Body Dressing: Supervision/safety Where  Assessed-Upper Body Dressing: Edge of bed Lower Body Dressing: Dependent (+2) Where Assessed-Lower Body Dressing: Edge of bed Mobility  Bed Mobility Bed Mobility: Rolling Right;Right Sidelying to Sit Rolling Right: Contact Guard/Touching assist Right Sidelying to Sit: Contact Guard/Touching assist Transfers Sit to Stand: 2 Helpers Stand to Sit: 2 Helpers   Discharge Criteria: Patient will be discharged from OT if patient refuses treatment 3 consecutive times without medical reason, if treatment goals not met, if there is a change in medical status, if patient makes no progress towards goals or if patient is discharged from hospital.  The above assessment, treatment plan, treatment alternatives and goals were discussed and mutually agreed upon: by patient  Simonne Come 02/20/2020, 12:14 PM

## 2020-02-20 NOTE — Consult Note (Signed)
Consultation  Referring Provider: Dr. Allena Katz     Primary Care Physician:  Sharlene Dory, DO Primary Gastroenterologist: Dr. Adela Lank       Reason for Consultation: Ileus, abdominal pain and decreased hemoglobin             HPI:   Jon Townsend is a 65 y.o. male with a past medical history as listed below including type 2 diabetes, hypertension and BPH, who initially presented to the ED 02/03/2020 with 48 hours of progressive weakness and paresthesias, work-up revealed areflexia with tetraplegia concerning for GBS in the setting of Covid infection, we are consulted in regards to ileus and some abdominal discomfort as well as a decreasing hemoglobin.    02/14/2020 abdominal x-ray with gaseous distention of bowel without dilation, findings could reflect ileus in the appropriate clinical setting.    02/14/2020 CT abdomen pelvis with contrast with subtle distal colonic wall thickening with some loss of architecture which may be chronic or secondary to colitis, small volume formed stool throughout the colon, diffuse hepatic steatosis and aortic atherosclerosis.    02/19/2020 abdominal x-ray with mild ileus.    02/07/2020 hemoglobin 15.2--> 02/17/2020 14.5--> 02/18/2020 13.4--> 02/20/2020 12.7.    Per discharge notes on 02/19/2020 was noted patient had severe constipation during his stay thought triggered by prolonged narcotic use, he was placed on aggressive bowel regimen with excellent results however started having abdominal cramping and traces of blood in his stool, x-ray showed concern for ileus and CT showed signs concerning for colitis thought likely in the setting of severe constipation.    Today, the patient describes that for the past 3 weeks since being hospitalized he has not been able to have a normal bowel movement.  Over the past week or so things have been getting worse and they tried various laxatives but nothing is really helping.  Over the past couple of days he has noted increased  abdominal distention at times, stating that this morning he woke up and everything looked fine but after eating a few bites of food seem to "blow back up".  Tells me that when he tries to have a bowel movement he just passes a lot of mucus.  Did have some nausea over the past week but this seems to have resolved recently.  Patient tells me he is not eager about having procedures if they are recommended.  Denies having any bright red blood or black tarry stools.    Denies fever, chills or weight loss.   GI history: 02/13/2018 colonoscopy with Dr. Adela Lank: - Preparation of the colon was fair, particularly in dependant portions of the colon such as cecum and splenic flexure - One diminutive polyp in the ascending colon, removed with a cold snare. Resected and retrieved. - The examination was otherwise normal.  Recommendations were for repeat in 1 year due to poor prep.  Past Medical History:  Diagnosis Date  . Allergy   . BPH (benign prostatic hyperplasia)   . Diabetes mellitus type 2 in obese (HCC)   . Glaucoma   . Hypertension     Past Surgical History:  Procedure Laterality Date  . Broken finger    . TONSILLECTOMY      Family History  Problem Relation Age of Onset  . Cancer Father   . Early death Father   . Diabetes Sister   . Heart disease Sister   . COPD Sister   . Diabetes Sister   . Colon cancer Neg Hx   .  Esophageal cancer Neg Hx   . Stomach cancer Neg Hx   . Rectal cancer Neg Hx     Social History   Tobacco Use  . Smoking status: Former Games developer  . Smokeless tobacco: Never Used  . Tobacco comment: Quit 35 years ago  Substance Use Topics  . Alcohol use: Never  . Drug use: Never    Prior to Admission medications   Medication Sig Start Date End Date Taking? Authorizing Provider  amLODipine (NORVASC) 5 MG tablet Take 1 tablet (5 mg total) by mouth daily. 09/08/19   Sharlene Dory, DO  atorvastatin (LIPITOR) 40 MG tablet Take 1 tablet (40 mg total) by  mouth daily. Patient taking differently: Take 40 mg by mouth at bedtime. 11/07/19   Sharlene Dory, DO  Blood Glucose Monitoring Suppl [facility] VERIO) w/Device KIT Use once daily to check blood sugar 01/23/18   Wendling, Jilda Roche, DO  Coenzyme Q10 (COQ10) 100 MG CAPS Take 100 mg by mouth in the morning.    [provider]  docusate sodium (COLACE) 100 MG capsule Take 1 capsule (100 mg total) by mouth 2 (two) times daily. 02/19/20   Sheikh, Omair Latif, DO  fexofenadine (ALLEGRA) 180 MG tablet Take 180 mg by mouth in the morning.    [provider]  gabapentin (NEURONTIN) 100 MG capsule Take 1 capsule (100 mg total) by mouth 3 (three) times daily. 02/19/20   Marguerita Merles Latif, DO  glucose blood [facility] VERIO) test strip Use as once daily to check blood sugar. 01/23/18   Wendling, Jilda Roche, DO  Krill Oil 1000 MG CAPS Take 1,000 mg by mouth daily.    [provider]  Lancets [facility] ULTRASOFT) lancets Use daily to check blood sugar. 01/23/18   Wendling, Jilda Roche, DO  latanoprost (XALATAN) 0.005 % ophthalmic solution Place 1 drop into both eyes at bedtime.    [provider]  lidocaine (LIDODERM) 5 % Place 1 patch onto the skin daily. Remove & Discard patch within 12 hours or as directed by MD 02/20/20   Marguerita Merles Latif, DO  magnesium oxide (MAG-OX) 400 (241.3 Mg) MG tablet Take 1 tablet (400 mg total) by mouth daily. 02/21/20   Love, Evlyn Kanner, PA-C  melatonin 10 MG TABS Take 10 mg by mouth at bedtime as needed (sleep). 02/19/20   Marguerita Merles Latif, DO  metFORMIN (GLUCOPHAGE-XR) 500 MG 24 hr tablet Take 2 tablets (1,000 mg total) by mouth daily with breakfast. 09/08/19   Sharlene Dory, DO  metoprolol tartrate (LOPRESSOR) 50 MG tablet Take 1 tablet (50 mg total) by mouth 2 (two) times daily. Patient taking differently: Take 50 mg by mouth in the morning and at bedtime. 11/07/19   Sharlene Dory, DO  ondansetron  (ZOFRAN-ODT) 4 MG disintegrating tablet Take 1 tablet (4 mg total) by mouth every 8 (eight) hours as needed for nausea or vomiting. 02/19/20   Marguerita Merles Latif, DO  oxyCODONE 10 MG TABS Take 1 tablet (10 mg total) by mouth every 4 (four) hours as needed for severe pain. 02/19/20   Marguerita Merles Latif, DO  simethicone (MYLICON) 80 MG chewable tablet Chew 0.5 tablets (40 mg total) by mouth every 6 (six) hours as needed for flatulence. 02/19/20   Marguerita Merles Latif, DO  sodium chloride (OCEAN) 0.65 % SOLN nasal spray Place 1 spray into both nostrils as needed for congestion. 02/19/20   Marguerita Merles Latif, DO  tamsulosin (FLOMAX) 0.4 MG CAPS capsule Take 1  capsule (0.4 mg total) by mouth daily. 10/14/19   Sharlene Dory, DO  trimethoprim-polymyxin b (POLYTRIM) ophthalmic solution Place 1 drop into the left eye every 4 (four) hours. 02/19/20   Marguerita Merles Latif, DO  vitamin C (ASCORBIC ACID) 500 MG tablet Take 500-1,000 mg by mouth daily.    [provider]    Current Facility-Administered Medications  Medication Dose Route Frequency Provider Last Rate Last Admin  . (feeding supplement) PROSource Plus liquid 30 mL  30 mL Oral BID BM Love, Pamela S, PA-C   30 mL at 02/20/20 1025  . acetaminophen (TYLENOL) tablet 325-650 mg  325-650 mg Oral Q4H PRN Love, Pamela S, PA-C      . alum & mag hydroxide-simeth (MAALOX/MYLANTA) 200-200-20 MG/5ML suspension 30 mL  30 mL Oral Q4H PRN Love, Pamela S, PA-C      . amLODipine (NORVASC) tablet 5 mg  5 mg Oral Daily Love, Evlyn Kanner, PA-C   5 mg at 02/20/20 0734  . atorvastatin (LIPITOR) tablet 40 mg  40 mg Oral QHS Jacquelynn Cree, PA-C   40 mg at 02/19/20 2118  . bisacodyl (DULCOLAX) suppository 10 mg  10 mg Rectal Daily PRN Jacquelynn Cree, PA-C      . Chlorhexidine Gluconate Cloth 2 % PADS 6 each  6 each Topical Daily Marcello Fennel, MD      . diphenhydrAMINE (BENADRYL) 12.5 MG/5ML elixir 12.5-25 mg  12.5-25 mg Oral Q6H PRN Love, Pamela S, PA-C       . enoxaparin (LOVENOX) injection 40 mg  40 mg Subcutaneous Q24H Love, Pamela S, PA-C   40 mg at 02/19/20 1632  . fexofenadine (ALLEGRA) tablet 60 mg  60 mg Oral BID Jacquelynn Cree, PA-C   60 mg at 02/20/20 0804  . gabapentin (NEURONTIN) capsule 300 mg  300 mg Oral TID Genice Rouge, MD   300 mg at 02/20/20 0734  . guaiFENesin-dextromethorphan (ROBITUSSIN DM) 100-10 MG/5ML syrup 5-10 mL  5-10 mL Oral Q6H PRN Love, Pamela S, PA-C      . insulin aspart (novoLOG) injection 0-5 Units  0-5 Units Subcutaneous QHS Jacquelynn Cree, PA-C   2 Units at 02/19/20 2126  . insulin aspart (novoLOG) injection 0-9 Units  0-9 Units Subcutaneous TID WC LoveEvlyn Kanner, PA-C   1 Units at 02/20/20 1224  . latanoprost (XALATAN) 0.005 % ophthalmic solution 1 drop  1 drop Both Eyes QHS Jacquelynn Cree, PA-C   1 drop at 02/19/20 2132  . lidocaine (LIDODERM) 5 % 1 patch  1 patch Transdermal Q24H Love, Pamela S, PA-C      . lidocaine (XYLOCAINE) 2 % jelly   Topical PRN Love, Pamela S, PA-C      . magnesium oxide (MAG-OX) tablet 400 mg  400 mg Oral Daily Jacquelynn Cree, PA-C   400 mg at 02/20/20 8332  . melatonin tablet 10 mg  10 mg Oral QHS PRN Love, Pamela S, PA-C      . metoCLOPramide (REGLAN) injection 5 mg  5 mg Intravenous Q6H Love, Pamela S, PA-C   5 mg at 02/20/20 1224  . metoprolol tartrate (LOPRESSOR) tablet 50 mg  50 mg Oral BID Jacquelynn Cree, PA-C   50 mg at 02/20/20 7380  . oxyCODONE (Oxy IR/ROXICODONE) immediate release tablet 10 mg  10 mg Oral Q4H PRN Jacquelynn Cree, PA-C   10 mg at 02/20/20 0241  . polyethylene glycol (MIRALAX / GLYCOLAX) packet 17 g  17 g  Oral Daily PRN Love, Pamela S, PA-C      . prochlorperazine (COMPAZINE) tablet 5-10 mg  5-10 mg Oral Q6H PRN Love, Pamela S, PA-C       Or  . prochlorperazine (COMPAZINE) injection 5-10 mg  5-10 mg Intramuscular Q6H PRN Love, Pamela S, PA-C       Or  . prochlorperazine (COMPAZINE) suppository 12.5 mg  12.5 mg Rectal Q6H PRN Love, Pamela S, PA-C      .  protein supplement (ENSURE MAX) liquid  11 oz Oral BID Bary Leriche, PA-C   11 oz at 02/20/20 0749  . sodium chloride (OCEAN) 0.65 % nasal spray 1 spray  1 spray Each Nare PRN Love, Pamela S, PA-C      . sodium phosphate (FLEET) 7-19 GM/118ML enema 1 enema  1 enema Rectal Once PRN Love, Pamela S, PA-C      . tamsulosin (FLOMAX) capsule 0.8 mg  0.8 mg Oral QPC supper Lovorn, Megan, MD   0.8 mg at 02/19/20 1714  . traZODone (DESYREL) tablet 25-50 mg  25-50 mg Oral QHS PRN Bary Leriche, PA-C   50 mg at 02/19/20 2131  . trimethoprim-polymyxin b (POLYTRIM) ophthalmic solution 1 drop  1 drop Left Eye Q4H Love, Ivan Anchors, PA-C   1 drop at 02/20/20 1224    Allergies as of 02/19/2020  . (No Known Allergies)     Review of Systems:    Constitutional: No weight loss, fever or chills Skin: No rash  Cardiovascular: No chest pain   Respiratory: No SOB Gastrointestinal: See HPI and otherwise negative Genitourinary: No dysuria  Neurological: No headache Musculoskeletal: +GBS Hematologic: No bleeding  Psychiatric: No history of depression or anxiety    Physical Exam:  Vital signs in last 24 hours: Temp:  [98.1 F (36.7 C)-98.7 F (37.1 C)] 98.7 F (37.1 C) (02/11 0400) Pulse Rate:  [77-91] 89 (02/11 0400) Resp:  [14-18] 14 (02/11 0400) BP: (120-143)/(71-75) 120/73 (02/11 0400) SpO2:  [95 %-100 %] 95 % (02/11 0400) Weight:  [108.7 kg] 108.7 kg (02/10 1457) Last BM Date: 02/19/20 General:   Pleasant Caucasian male appears to be in NAD, Well developed, Well nourished, alert and cooperative Head:  Normocephalic and atraumatic. Eyes:   PEERL, EOMI. No icterus. Conjunctiva pink. Ears:  Normal auditory acuity. Neck:  Supple Throat: Oral cavity and pharynx without inflammation, swelling or lesion.  Lungs: Respirations even and unlabored. Lungs clear to auscultation bilaterally.   No wheezes, crackles, or rhonchi.  Heart: Normal S1, S2. No MRG. Regular rate and rhythm. No peripheral edema,  cyanosis or pallor.  Abdomen:  Soft, mild distension, mild generalized ttp, No rebound or guarding. Slightly hypertympanic bowel sounds heard in all 4 quads. No appreciable masses or hepatomegaly. Rectal:  Not performed.  Msk:  Symmetrical without gross deformities. Peripheral pulses intact.  Extremities:  Without edema, no deformity or joint abnormality.  Neurologic:  Alert and  oriented x4;  grossly normal neurologically.  Skin:   Dry and intact without significant lesions or rashes. Psychiatric: Demonstrates good judgement and reason without abnormal affect or behaviors.   LAB RESULTS: Recent Labs    02/18/20 0342 02/19/20 0255 02/20/20 0451  WBC 11.1* 8.9 6.7  HGB 13.4 13.6 12.7*  HCT 38.7* 38.9* 37.5*  PLT 125* 154 166   BMET Recent Labs    02/18/20 0342 02/19/20 0255 02/20/20 0451  NA 129* 133* 130*  K 3.8 4.0 3.9  CL 97* 96* 97*  CO2 23 27  25  GLUCOSE 152* 140* 177*  BUN $Re'16 14 17  'ZbP$ CREATININE 0.70 0.83 0.78  CALCIUM 8.0* 8.5* 8.0*   LFT Recent Labs    02/20/20 0451  PROT 5.6*  ALBUMIN 2.0*  AST 25  ALT 29  ALKPHOS 71  BILITOT 1.0   STUDIES: DG Abd 1 View  Result Date: 02/19/2020 CLINICAL DATA:  Abdominal distension EXAM: ABDOMEN - 1 VIEW COMPARISON:  02/14/2020 FINDINGS: Numerous gas-filled nondilated loops of large and small bowel are seen throughout the abdomen most in keeping with an mild adynamic ileus. There is no gross free intraperitoneal gas. No organomegaly. Degenerative changes are seen within the lumbar spine. No abnormal calcifications within the abdomen. IMPRESSION: Mild ileus Electronically Signed   By: Fidela Salisbury MD   On: 02/19/2020 16:08    Impression / Plan:   Impression: 1.  Ileus: Seen initially on x-ray 02/14/2020, patient continues with constipation since then, producing mucus and nothing else, also passing gas as of today; consider relation to GBS and narcotics recently 2.  Anemia: See HPI, mild drop in hemoglobin since patient has  been admitted, last colonoscopy in 2020 with recommendations to repeat in 1 year due to poor prep; consider GI source versus other 3.  Recent GBS: Currently in inpatient rehab, improving  Plan: 1.  We will discuss all the above with Dr. Havery Moros.  Please await further recommendations from him later today. 2.  Of note patient is not eager to have any type of procedure EGD or colonoscopy if recommended.  Thank you for your kind consultation, we will continue to follow.  Lavone Nian Garnell Phenix  02/20/2020, 1:33 PM

## 2020-02-21 LAB — GLUCOSE, CAPILLARY
Glucose-Capillary: 160 mg/dL — ABNORMAL HIGH (ref 70–99)
Glucose-Capillary: 182 mg/dL — ABNORMAL HIGH (ref 70–99)
Glucose-Capillary: 133 mg/dL — ABNORMAL HIGH (ref 70–99)
Glucose-Capillary: 155 mg/dL — ABNORMAL HIGH (ref 70–99)

## 2020-02-21 MED ORDER — POLYETHYLENE GLYCOL 3350 17 G PO PACK
34.0000 g | PACK | Freq: Two times a day (BID) | ORAL | Status: DC
  Administered 2020-02-21 (×2): 34 g via ORAL
  Filled 2020-02-21 (×2): qty 2

## 2020-02-21 NOTE — Progress Notes (Signed)
Occupational Therapy Session Note  Patient Details  Name: Jon Townsend MRN: [id number] Date of Birth: 06-Feb-1955  Today's Date: 02/21/2020 OT Individual Time: 0626-9485 and 1400-1426 OT Individual Time Calculation (min): 56 min and 26 min  Short Term Goals: Week 1:  OT Short Term Goal 1 (Week 1): Pt will complete sit > stand mod assist of one caregiver in preparation for LB dressing OT Short Term Goal 2 (Week 1): Pt will complete LB dressing at sit > stand level with mod assist OT Short Term Goal 3 (Week 1): Pt will complete toilet transfer with mod assist of one caregiver OT Short Term Goal 4 (Week 1): Pt will complete toileting with max assist of one caregiver  Skilled Therapeutic Interventions/Progress Updates:    Pt greeted in the w/c, very fatigued post PT session and just returned to the room. Pt reporting some discomfort in his neck, declining for OT to ask RN if he could have some pain medicine. ADL needs were met, pt agreeable to start session by relieving tension from neck. OT applied MHP over upper traps and provided gentle massage, guided pt through gentle yoga-based upper body stretches after with MHP applied. Pt reported improvement in his neck discomfort post intervention, skin intact. Next to work on UB strengthening in prep for functional transfer training, instructed pt through B UE exercises using 3# dumbbell in each hand, pt quickly fatigued after 10 reps of exercise, often needed rest breaks. He completed 2 sets 10 reps bicep curls, straight arm raises, tricep extension, forward/backward rows, and forward press. Also taught him some seated pressure relief exercises and how to use a small strap to mobilize LEs while sitting up to prevent numbness/discomfort when OOB. He wanted to remain sitting up at end of tx, left him with all needs within reach and safety belt fastened.  2nd Session 1:1 tx (26 min) Pt greeted in the w/c with spouse Clarene Critchley present. Pt reported feeling very  exhausted from multiple therapies today, requesting to return to bed. Tried sit<stand using RW with pt unable to achieve full stand with Max A of 1. Max A for squat pivot<bed using the bedrail, note that pt needed facilitation for anterior weight shift and cues for hand placement when scooting. He stated "it's ok, I got it" when straddling both w/c and bed, needed cues for hand placement as he tried to push himself using the w/c cushion ?push w/c away. Mod A for transition to supine where session focus was then placed on core strengthening via supine anterior reaches to target outside of base of support. Pt easily fatigued after ~30 seconds of participation, needing frequent rest breaks due to debility. At end of session pt remained in bed, left with all needs within reach and bed alarm set.   Therapy Documentation Precautions:  Precautions Precautions: Fall Precaution Comments: off COVID precautions as of 02/13/20 Restrictions Weight Bearing Restrictions: No Pain: neck "kink," addressed therapeutically as stated above (during 1st session); no c/o pain during 2nd session   ADL: ADL Grooming: Setup Where Assessed-Grooming: Edge of bed Upper Body Bathing: Setup,Supervision/safety Where Assessed-Upper Body Bathing: Edge of bed Lower Body Bathing: Dependent (+2) Where Assessed-Lower Body Bathing: Edge of bed Upper Body Dressing: Supervision/safety Where Assessed-Upper Body Dressing: Edge of bed Lower Body Dressing: Dependent (+2) Where Assessed-Lower Body Dressing: Edge of bed     Therapy/Group: Individual Therapy  [name] [name] 02/21/2020, 12:39 PM

## 2020-02-21 NOTE — Progress Notes (Addendum)
Progress Note   Subjective  Chief Complaint: Constipation  Patient reports small amount of stool and dark liquid this morning.  Does feel relief from abdominal pain.  Wife is wondering what the next steps are. Pt has not had opioids now for 36 hours per his report.   Objective   Vital signs in last 24 hours: Temp:  [97.6 F (36.4 C)-98.4 F (36.9 C)] 98.4 F (36.9 C) (02/12 0837) Pulse Rate:  [62-101] 89 (02/12 0837) Resp:  [15-18] 16 (02/12 0837) BP: (129-144)/(62-83) 129/62 (02/12 0837) SpO2:  [94 %-98 %] 98 % (02/12 0837) Last BM Date: 02/20/20 General:    white male in NAD Heart:  Regular rate and rhythm; no murmurs Lungs: Respirations even and unlabored, lungs CTA bilaterally Abdomen:  Soft, nontender and nondistended. Normal bowel sounds- improved from yesterday Psych:  Cooperative. Normal mood and affect.  Intake/Output from previous day: 02/11 0701 - 02/12 0700 In: 720 [P.O.:720] Out: 2925 [Urine:2925] Intake/Output this shift: Total I/O In: 240 [P.O.:240] Out: 400 [Urine:400]  Lab Results: Recent Labs    02/19/20 0255 02/20/20 0451  WBC 8.9 6.7  HGB 13.6 12.7*  HCT 38.9* 37.5*  PLT 154 166   BMET Recent Labs    02/19/20 0255 02/20/20 0451  NA 133* 130*  K 4.0 3.9  CL 96* 97*  CO2 27 25  GLUCOSE 140* 177*  BUN 14 17  CREATININE 0.83 0.78  CALCIUM 8.5* 8.0*   LFT Recent Labs    02/20/20 0451  PROT 5.6*  ALBUMIN 2.0*  AST 25  ALT 29  ALKPHOS 71  BILITOT 1.0   Studies/Results: DG Abd 1 View  Result Date: 02/19/2020 CLINICAL DATA:  Abdominal distension EXAM: ABDOMEN - 1 VIEW COMPARISON:  02/14/2020 FINDINGS: Numerous gas-filled nondilated loops of large and small bowel are seen throughout the abdomen most in keeping with an mild adynamic ileus. There is no gross free intraperitoneal gas. No organomegaly. Degenerative changes are seen within the lumbar spine. No abnormal calcifications within the abdomen. IMPRESSION: Mild ileus  Electronically Signed   By: Fidela Salisbury MD   On: 02/19/2020 16:08     Assessment / Plan:   Assessment: 1. Ileus:Seen initially on x-ray 02/14/2020, patient continues with constipation since then, given 1 dose Relistor on 02/20/20 with some relief this morning; thought related to narcotics 2. Anemia: 3. Recent GBS after COVID  Plan: 1. Will plan for increased Miralax today, two doses now and another 2 doses this afternoon. 2. Continue other supportive measures. 3. Continue to hold narcotics 4. Please await any further recommendations from Dr. Lynnell Chad later today  Thank you for your kind consultation, we will continue to follow.   LOS: 2 days   Levin Erp  02/21/2020, 11:23 AM     ATTENDING ATTESTATION: Patient doing a bit better today. Following Relistor passed some gas with some liquid stool yesterday. Feels a bit more decompressed. Will continue to hold narcotics, he is okay to do rehab without this, he thinks he can handle it. We discussed options for a bowel regimen moving forward. Will give him a double dose of Miralax this AM and perhaps another dose later today and see how things go, probably will need this daily moving forward. Encouraged him to be out of bed and as active as he can be with rehab. No overt bleeding, no reason to be checking occult blood stools now so have stopped that, he warrants colonoscopy regardless as outpatient as he is overdue  for this but don't think we need to do it inpatient if he otherwise improves. We will check on him tomorrow, call with questions.   Cellar, MD [facility]

## 2020-02-21 NOTE — Progress Notes (Signed)
[place] PHYSICAL MEDICINE & REHABILITATION PROGRESS NOTE  Subjective/Complaints: Pt had small liquidy bowel movement right before I visited this morning. Still feels bloated. Frustrated that his mobility isn't better yet  ROS: Patient denies fever, rash, sore throat, blurred vision, nausea, vomiting,  cough, shortness of breath or chest pain, joint or back pain, headache, or mood change.    Objective: Vital Signs: Blood pressure 129/62, pulse 89, temperature 98.4 F (36.9 C), temperature source Oral, resp. rate 16, height 6\' 1"  (1.854 m), weight 108.7 kg, SpO2 98 %. DG Abd 1 View  Result Date: 02/19/2020 CLINICAL DATA:  Abdominal distension EXAM: ABDOMEN - 1 VIEW COMPARISON:  02/14/2020 FINDINGS: Numerous gas-filled nondilated loops of large and small bowel are seen throughout the abdomen most in keeping with an mild adynamic ileus. There is no gross free intraperitoneal gas. No organomegaly. Degenerative changes are seen within the lumbar spine. No abnormal calcifications within the abdomen. IMPRESSION: Mild ileus Electronically Signed   By: Fidela Salisbury MD   On: 02/19/2020 16:08   Recent Labs    02/19/20 0255 02/20/20 0451  WBC 8.9 6.7  HGB 13.6 12.7*  HCT 38.9* 37.5*  PLT 154 166   Recent Labs    02/19/20 0255 02/20/20 0451  NA 133* 130*  K 4.0 3.9  CL 96* 97*  CO2 27 25  GLUCOSE 140* 177*  BUN 14 17  CREATININE 0.83 0.78  CALCIUM 8.5* 8.0*    Intake/Output Summary (Last 24 hours) at 02/21/2020 1148 Last data filed at 02/21/2020 0710 Gross per 24 hour  Intake 720 ml  Output 2975 ml  Net -2255 ml        Physical Exam: BP 129/62 (BP Location: Right Arm)   Pulse 89   Temp 98.4 F (36.9 C) (Oral)   Resp 16   Ht 6\' 1"  (1.854 m)   Wt 108.7 kg   SpO2 98%   BMI 31.62 kg/m  Constitutional: No distress . Vital signs reviewed. HEENT: EOMI, oral membranes moist Neck: supple Cardiovascular: RRR without murmur. No JVD    Respiratory/Chest: CTA Bilaterally  without wheezes or rales. Normal effort    GI/Abdomen: abdomen distended. BS scarce, NT. Ext: no clubbing, cyanosis, or edema Psych: pleasant and cooperative Musc: No edema in extremities.  No tenderness in extremities. Neuro: Alert Motor: Bilateral upper extremities: 5/5 proximal distally  Bilateral lower extremities: Hip flexion, knee extension 4-55, ankle dorsiflexion 4/5 Sensory 1+/2 bilateral LE's  Assessment/Plan: 1. Functional deficits which require 3+ hours per day of interdisciplinary therapy in a comprehensive inpatient rehab setting.  Physiatrist is providing close team supervision and 24 hour management of active medical problems listed below.  Physiatrist and rehab team continue to assess barriers to discharge/monitor patient progress toward functional and medical goals   Care Tool:  Bathing    Body parts bathed by patient: Right arm,Left arm,Chest,Abdomen,Face   Body parts bathed by helper: Front perineal area,Buttocks,Right upper leg,Left upper leg,Right lower leg,Left lower leg     Bathing assist Assist Level: 2 Helpers     Upper Body Dressing/Undressing Upper body dressing   What is the patient wearing?: Pull over shirt    Upper body assist Assist Level: Supervision/Verbal cueing    Lower Body Dressing/Undressing Lower body dressing      What is the patient wearing?: Pants,Incontinence brief     Lower body assist Assist for lower body dressing: Moderate Assistance - Patient 50 - 74%     Chartered loss adjuster  assist Assist for toileting: Maximal Assistance - Patient 25 - 49%     Transfers Chair/bed transfer  Transfers assist     Chair/bed transfer assist level: Moderate Assistance - Patient 50 - 74%     Locomotion Ambulation   Ambulation assist   Ambulation activity did not occur: Safety/medical concerns (fatigue, generalized weakness, poor endurance)  Assist level: 2 helpers Assistive device: Walker-rolling Max  distance: 36   Walk 10 feet activity   Assist  Walk 10 feet activity did not occur: Safety/medical concerns (fatigue, generalized weakness, poor endurance)  Assist level: 2 helpers Assistive device: Walker-rolling   Walk 50 feet activity   Assist Walk 50 feet with 2 turns activity did not occur: Safety/medical concerns (fatigue, generalized weakness, poor endurance)         Walk 150 feet activity   Assist Walk 150 feet activity did not occur: Safety/medical concerns (fatigue, generalized weakness, poor endurance)         Walk 10 feet on uneven surface  activity   Assist Walk 10 feet on uneven surfaces activity did not occur: Safety/medical concerns (fatigue, generalized weakness, poor endurance)         Wheelchair     Assist Will patient use wheelchair at discharge?: Yes Type of Wheelchair: Manual Wheelchair activity did not occur: Safety/medical concerns (fatigue, generalized weakness, poor endurance)  Wheelchair assist level: Supervision/Verbal cueing Max wheelchair distance: 68ft    Wheelchair 50 feet with 2 turns activity    Assist    Wheelchair 50 feet with 2 turns activity did not occur: Safety/medical concerns (fatigue, generalized weakness, poor endurance)   Assist Level: Supervision/Verbal cueing   Wheelchair 150 feet activity     Assist  Wheelchair 150 feet activity did not occur: Safety/medical concerns (fatigue, generalized weakness, poor endurance)        Medical Problem List and Plan: 1.  Impaired strength, ADLs, function, mobility secondary to Guillain Barre syndrome from COVID- 2nd time having COIVD  -Continue CIR therapies including PT, OT  2.  Antithrombotics: -DVT/anticoagulation:  Pharmaceutical: Lovenox  -antiplatelet therapy: N/A 3. Pain Management:Increased gabapentin to 300 mg TID due to nerve pain             Continue oxycodone prn  Monitor with increased exertion 4. Mood: LCSW to follow for evaluation and  support.              -antipsychotic agents: N/A 5. Neuropsych: This patient is capable of making decisions on her own behalf. 6. Skin/Wound Care: Routine pressure relief measures. 7. Fluids/Electrolytes/Nutrition: Monitor I/Os.  8.  Ileus             2/12: ongoing ileus   Continue IV Reglan   On liquid diet, OOB, activity   GI following, appreciate their help---double dose miralax bid added 9. HTN: Monitor BP tid--continue Norvasc and lopressor  Monitor with increased mobility 10. H/o BPH/Neurogenic bladder:   Increased Flomax to 0.8 mg daily  Plan to DC Foley after improvement in ileus 11. T2DM with hyperglycemia: Monitor BS ac/hs             Metformin once daily for now as intake starting to improve.              Monitor with increased ambulation 12. Hyponatremia:   Sodium 130 on 2/11, labs ordered for Monday 13. Bloody stools:   Hemoccult positive  Hemoglobin 12.7 on 2/11, labs ordered for Monday  GI aware, following 14. Anorexia/ Low protein stores: Added prostat  and offer Ensure Max if tolerated. 15. Eye infection: Symptoms improving but still erythematous              Continue allegra and polytrim eye drops   LOS: 2 days A FACE TO FACE EVALUATION WAS PERFORMED  Meredith Staggers 02/21/2020, 11:48 AM

## 2020-02-21 NOTE — Progress Notes (Signed)
Physical Therapy Session Note  Patient Details  Name: Jon Townsend MRN: [id number] Date of Birth: 21-Oct-1955  Today's Date: 02/21/2020 PT Individual Time: [phone] and 1300-1339 PT Individual Time Calculation (min): 53 min and 39 min  Short Term Goals: Week 1:  PT Short Term Goal 1 (Week 1): pt will perform all bed mobility with CGA PT Short Term Goal 2 (Week 1): pt will transfer sit<>stand with LRAD and min A PT Short Term Goal 3 (Week 1): pt will transfer bed<>chair with LRAD and min A  Skilled Therapeutic Interventions/Progress Updates:   Treatment Session 1: 0900-0953 53 min Received pt supine in bed, pt agreeable to therapy, and denied any pain during session but continues to report numbness and tingling in bilateral hands and feet. Session with emphasis on dressing, functional mobility/transfers, generalized strengthening, dynamic standing balance/coordination, simulated car transfers, and improved activity tolerance. Pt continues to require maximal extended rest breaks throughout session due to fatigue, weakness, and poor activity tolerance. Donned pants in supine with total A to initially thread LEs and catheter through and pt able to pull pants up to waist. Pt rolled L and R with supervision and use of bedrails and required max A to pull pants over hips. Pt transferred supine<>sitting EOB with close supervision and doffed dirty gown and donned clean pull over shirt with supervision. Stand<>pivot bed<>WC with RW and mod A with no buckling noted. Pt sat in [facility] and brushed teeth and washed face at sink with supervision. Pt performed WC mobility 25ft using BUE and supervision and transported remainder of way to ortho gym in [facility] total A due to fatigue. Set up car transfer and pt transferred sit<>stand with RW. Upon attempting to stand, pt with R knee buckling and unable to recover; required total A to pull WC underneath pt and scoot hips backwards. Pt required extensive rest break then  transferred sit<>stand with mod A with cues for hand placement and therapist standing on R side and performed stand<>pivot transfer into car. Pt required use of UEs to get feet into car due to LE weakness. Pt reported 8/10 fatigue after car transfer and became tearful talking about his current medical state and desire to see his family and grandchildren. Therapist provided emotional support and encouragement. Pt transported to dayroom in [facility] total A and performed seated BLE strengthening on Kinetron at 20cm/sec for 1 minute x 2 trials and 40 seconds x 1 trial with BUE support and supervision with emphasis on glute/quad strengthening. Pt reported having "crick" in his neck from sleeping last night; demonstrated L cervical rotation and L upper trapezius stretch to pt and encouraged pt to perform these throughout day. Also encouraged pt to ask his wife to bring in his personal pillow from home. Pt transported back to room in [facility] total A. Concluded session with pt sitting in WC, needs within reach, and seatbelt alarm on awaiting OT session.   Treatment Session 2: [id number] 39 min Received pt sitting in [facility] with wife present at bedside, pt agreeable to therapy, and denied any pain during session but reported fatigue from previous therapies. Session with emphasis on functional mobility/transfers, generalized strengthening, dynamic standing balance/coordination, ambulation, and improved activity tolerance. Pt transported to dayroom in [facility] total A and transferred WC<>Nustep with RW and mod A with no knee buckling noted. Pt required min A to position LEs on Nustep footplates due to weakness. Pt performed BUE/LE strengthening on Nustep at workload 1 for a total of 5 minutes for 345 steps with  1 rest break due to fatigue with emphasis on cardiovascular endurance. Pt transferred sit<>stand from Nustep with mod A using RW and ambulated 38ft with RW and min A prior to sitting to rest; no buckling noted. Pt required extensive rest  break afterwards due to reports of 10/10 fatigue. Pt performed the following exercises sitting in WC with supervision and verbal cues for technique: -bicep curls with 3lb dowel 3x10 -horizontal chest press with 3lb dowel 3x8 -overhead chest press with 3lb dowel 2x8 Pt transported back to room in [facility] total A. Concluded session with pt sitting in WC, needs within reach, and seatbelt alarm on awaiting last OT session.   Therapy Documentation Precautions:  Precautions Precautions: Fall Precaution Comments: off COVID precautions as of 02/13/20 Restrictions Weight Bearing Restrictions: No  Therapy/Group: Individual Therapy Alfonse Alpers PT, DPT   02/21/2020, 7:19 AM

## 2020-02-22 DIAGNOSIS — K5900 Constipation, unspecified: Secondary | ICD-10-CM

## 2020-02-22 DIAGNOSIS — R140 Abdominal distension (gaseous): Secondary | ICD-10-CM

## 2020-02-22 DIAGNOSIS — R933 Abnormal findings on diagnostic imaging of other parts of digestive tract: Secondary | ICD-10-CM

## 2020-02-22 LAB — GLUCOSE, CAPILLARY
Glucose-Capillary: 132 mg/dL — ABNORMAL HIGH (ref 70–99)
Glucose-Capillary: 197 mg/dL — ABNORMAL HIGH (ref 70–99)
Glucose-Capillary: 152 mg/dL — ABNORMAL HIGH (ref 70–99)
Glucose-Capillary: 247 mg/dL — ABNORMAL HIGH (ref 70–99)

## 2020-02-22 MED ORDER — POLYETHYLENE GLYCOL 3350 17 G PO PACK
17.0000 g | PACK | Freq: Two times a day (BID) | ORAL | Status: DC
  Administered 2020-02-22 – 2020-02-27 (×11): 17 g via ORAL
  Filled 2020-02-22 (×11): qty 1

## 2020-02-22 NOTE — Progress Notes (Signed)
      Progress Note   Subjective  Patient states he has had 2 good bowel movements since I have last seen him. Took the Miralax 4 doses total yesterday, endorses good response. Abdomen feels better, wants to eat.    Objective   Vital signs in last 24 hours: Temp:  [97.6 F (36.4 C)-98.6 F (37 C)] 97.6 F (36.4 C) (02/13 0449) Pulse Rate:  [51-85] 82 (02/13 0449) Resp:  [17-18] 18 (02/13 0449) BP: (123-140)/(58-73) 140/72 (02/13 0449) SpO2:  [94 %-100 %] 100 % (02/13 0449) Last BM Date: 02/21/20 General:    white male in NAD Abdomen:  Soft, nontender and protuberant but less distended  Extremities:  Without edema. Neurologic:  Alert and oriented,  grossly normal neurologically. Psych:  Cooperative. Normal mood and affect.  Intake/Output from previous day: 02/12 0701 - 02/13 0700 In: 698 [P.O.:698] Out: 2750 [Urine:2750] Intake/Output this shift: No intake/output data recorded.  Lab Results: Recent Labs    02/20/20 0451  WBC 6.7  HGB 12.7*  HCT 37.5*  PLT 166   BMET Recent Labs    02/20/20 0451  NA 130*  K 3.9  CL 97*  CO2 25  GLUCOSE 177*  BUN 17  CREATININE 0.78  CALCIUM 8.0*   LFT Recent Labs    02/20/20 0451  PROT 5.6*  ALBUMIN 2.0*  AST 25  ALT 29  ALKPHOS 71  BILITOT 1.0   PT/INR No results for input(s): LABPROT, INR in the last 72 hours.  Studies/Results: No results found.     Assessment / Plan:    65 y/o male with history of GBS after COVID, on rehab and developed an ileus in the setting of ongoing narcotic use which he does not normally take. Now s/p a dose of Relistor and 4 doses of Miralax, has had 3 BMs in the past 2 days and is feeling better. He denies any nausea / vomiting, not obstructed, wants to eat. He has a very mild anemia but that is after a few weeks in the hospital with multiple blood draws. Stool OB (+) but no overt bleeding. He does warrant a colonoscopy but can do this outpatient once he is through  rehab.  Recommend: - continue Miralax daily. Would give one dose twice daily for now, can titrate up or down as needed - I think okay to stop Reglan, he has no upper tract symptoms  - okay to advance diet - he will contact me once ready to do colonoscopy as outpatient once he recovers from rehab  Call with questions, we will sign off for now.  Bothell Cellar, MD [facility]

## 2020-02-22 NOTE — Progress Notes (Signed)
[place] PHYSICAL MEDICINE & REHABILITATION PROGRESS NOTE  Subjective/Complaints: Pt feeling about the same this morning. No new complaints.   ROS: Patient denies fever, rash, sore throat, blurred vision,   cough, shortness of breath or chest pain, joint or back pain, headache, or mood change.    Objective: Vital Signs: Blood pressure 140/72, pulse 82, temperature 97.6 F (36.4 C), temperature source Oral, resp. rate 18, height 6\' 1"  (1.854 m), weight 108.7 kg, SpO2 100 %. No results found. Recent Labs    02/20/20 0451  WBC 6.7  HGB 12.7*  HCT 37.5*  PLT 166   Recent Labs    02/20/20 0451  NA 130*  K 3.9  CL 97*  CO2 25  GLUCOSE 177*  BUN 17  CREATININE 0.78  CALCIUM 8.0*    Intake/Output Summary (Last 24 hours) at 02/22/2020 1023 Last data filed at 02/22/2020 2355 Gross per 24 hour  Intake 458 ml  Output 2350 ml  Net -1892 ml        Physical Exam: BP 140/72 (BP Location: Left Arm)   Pulse 82   Temp 97.6 F (36.4 C) (Oral)   Resp 18   Ht 6\' 1"  (1.854 m)   Wt 108.7 kg   SpO2 100%   BMI 31.62 kg/m  Constitutional: No distress . Vital signs reviewed. HEENT: EOMI, oral membranes moist Neck: supple Cardiovascular: RRR without murmur. No JVD    Respiratory/Chest: CTA Bilaterally without wheezes or rales. Normal effort    GI/Abdomen: abd sl less distended. Still with scant bowel sounds, NT Ext: no clubbing, cyanosis, or edema Psych: pleasant and cooperative Musc: No edema in extremities.  No tenderness in extremities. Neuro: Alert Motor: Bilateral upper extremities: 5/5 proximal distally  Bilateral lower extremities: Hip flexion, knee extension 4-55, ankle dorsiflexion 4/5 Sensory 1+/2 bilateral LE's--no changes  Assessment/Plan: 1. Functional deficits which require 3+ hours per day of interdisciplinary therapy in a comprehensive inpatient rehab setting.  Physiatrist is providing close team supervision and 24 hour management of active medical problems  listed below.  Physiatrist and rehab team continue to assess barriers to discharge/monitor patient progress toward functional and medical goals   Care Tool:  Bathing    Body parts bathed by patient: Right arm,Left arm,Chest,Abdomen,Face   Body parts bathed by helper: Front perineal area,Buttocks,Right upper leg,Left upper leg,Right lower leg,Left lower leg     Bathing assist Assist Level: 2 Helpers     Upper Body Dressing/Undressing Upper body dressing   What is the patient wearing?: Pull over shirt    Upper body assist Assist Level: Supervision/Verbal cueing    Lower Body Dressing/Undressing Lower body dressing      What is the patient wearing?: Pants,Incontinence brief     Lower body assist Assist for lower body dressing: Moderate Assistance - Patient 50 - 74%     Toileting Toileting    Toileting assist Assist for toileting: Total Assistance - Patient < 25%     Transfers Chair/bed transfer  Transfers assist     Chair/bed transfer assist level: Moderate Assistance - Patient 50 - 74%     Locomotion Ambulation   Ambulation assist   Ambulation activity did not occur: Safety/medical concerns (fatigue, generalized weakness, poor endurance)  Assist level: Minimal Assistance - Patient > 75% Assistive device: Walker-rolling Max distance: 36ft   Walk 10 feet activity   Assist  Walk 10 feet activity did not occur: Safety/medical concerns (fatigue, generalized weakness, poor endurance)  Assist level: 2 helpers Assistive device:  Walker-rolling   Walk 50 feet activity   Assist Walk 50 feet with 2 turns activity did not occur: Safety/medical concerns (fatigue, generalized weakness, poor endurance)         Walk 150 feet activity   Assist Walk 150 feet activity did not occur: Safety/medical concerns (fatigue, generalized weakness, poor endurance)         Walk 10 feet on uneven surface  activity   Assist Walk 10 feet on uneven surfaces  activity did not occur: Safety/medical concerns (fatigue, generalized weakness, poor endurance)         Wheelchair     Assist Will patient use wheelchair at discharge?: Yes Type of Wheelchair: Manual Wheelchair activity did not occur: Safety/medical concerns (fatigue, generalized weakness, poor endurance)  Wheelchair assist level: Supervision/Verbal cueing Max wheelchair distance: 73ft    Wheelchair 50 feet with 2 turns activity    Assist    Wheelchair 50 feet with 2 turns activity did not occur: Safety/medical concerns (fatigue, generalized weakness, poor endurance)   Assist Level: Supervision/Verbal cueing   Wheelchair 150 feet activity     Assist  Wheelchair 150 feet activity did not occur: Safety/medical concerns (fatigue, generalized weakness, poor endurance)       BP 140/72 (BP Location: Left Arm)   Pulse 82   Temp 97.6 F (36.4 C) (Oral)   Resp 18   Ht 6\' 1"  (1.854 m)   Wt 108.7 kg   SpO2 100%   BMI 31.62 kg/m   Medical Problem List and Plan: 1.  Impaired strength, ADLs, function, mobility secondary to Guillain Barre syndrome from COVID- 2nd time having COIVD  -Continue CIR therapies including PT, OT  2.  Antithrombotics: -DVT/anticoagulation:  Pharmaceutical: Lovenox  -antiplatelet therapy: N/A 3. Pain Management:Increased gabapentin to 300 mg TID due to nerve pain             Continue oxycodone prn (holding d/t #8)  Monitor with increased exertion 4. Mood: LCSW to follow for evaluation and support.              -antipsychotic agents: N/A 5. Neuropsych: This patient is capable of making decisions on her own behalf. 6. Skin/Wound Care: Routine pressure relief measures. 7. Fluids/Electrolytes/Nutrition: Monitor I/Os.  8.  Ileus             2/13: pt demonstrating improvement (had large bm after I rounded)    -has had 4 medium to large liquids stools since yesterday morning   Continue IV Reglan at 5mg  only as he's improving   On liquid diet,  OOB, activity, holding narcs   GI following, appreciate their help---double dose miralax bid added 9. HTN: Monitor BP tid--continue Norvasc and lopressor   2/13 controlled 10. H/o BPH/Neurogenic bladder:   Increased Flomax to 0.8 mg daily  Plan to DC Foley after improvement in ileus-->?Monday 11. T2DM with hyperglycemia: Monitor BS ac/hs             Metformin once daily for now as intake starting to improve.              Monitor with increased ambulation 12. Hyponatremia:   Sodium 130 on 2/11, labs ordered for Monday 13. Bloody stools:   Hemoccult positive  Hemoglobin 12.7 on 2/11, labs ordered for Monday  GI aware, following 14. Anorexia/ Low protein stores: Added prostat and offer Ensure Max if tolerated. 15. Eye infection: Symptoms improving but still erythematous  Continue allegra and polytrim eye drops   LOS: 3 days A FACE TO FACE EVALUATION WAS PERFORMED  Meredith Staggers 02/22/2020, 10:23 AM

## 2020-02-23 DIAGNOSIS — G629 Polyneuropathy, unspecified: Secondary | ICD-10-CM

## 2020-02-23 LAB — BASIC METABOLIC PANEL
Anion gap: 8 (ref 5–15)
BUN: 14 mg/dL (ref 8–23)
CO2: 29 mmol/L (ref 22–32)
Calcium: 8.2 mg/dL — ABNORMAL LOW (ref 8.9–10.3)
Chloride: 100 mmol/L (ref 98–111)
Creatinine, Ser: 0.94 mg/dL (ref 0.61–1.24)
GFR, Estimated: >60 mL/min (ref >60)
Glucose, Bld: 172 mg/dL — ABNORMAL HIGH (ref 70–99)
Potassium: 3.5 mmol/L (ref 3.5–5.1)
Sodium: 137 mmol/L (ref 135–145)

## 2020-02-23 LAB — CBC
HCT: 36.9 % — ABNORMAL LOW (ref 39.0–52.0)
Hemoglobin: 12.8 g/dL — ABNORMAL LOW (ref 13.0–17.0)
MCH: 31 pg (ref 26.0–34.0)
MCHC: 34.7 g/dL (ref 30.0–36.0)
MCV: 89.3 fL (ref 80.0–100.0)
Platelets: 173 10*3/uL (ref 150–400)
RBC: 4.13 MIL/uL — ABNORMAL LOW (ref 4.22–5.81)
RDW: 12.3 % (ref 11.5–15.5)
WBC: 6 10*3/uL (ref 4.0–10.5)
nRBC: 0 % (ref 0.0–0.2)

## 2020-02-23 LAB — GLUCOSE, CAPILLARY
Glucose-Capillary: 175 mg/dL — ABNORMAL HIGH (ref 70–99)
Glucose-Capillary: 159 mg/dL — ABNORMAL HIGH (ref 70–99)
Glucose-Capillary: 207 mg/dL — ABNORMAL HIGH (ref 70–99)
Glucose-Capillary: 182 mg/dL — ABNORMAL HIGH (ref 70–99)

## 2020-02-23 MED ORDER — METFORMIN HCL 500 MG PO TABS: 500.0000 mg | ORAL_TABLET | Freq: Every day | ORAL | Status: DC

## 2020-02-23 NOTE — Consult Note (Signed)
Neuropsychological Consultation   Patient:   Jon Townsend   DOB:   [birth year]  MR Number:  [id number]  Location:  [facility] [id number] [place] [address] Dept: [place]: [phone]           Date of Service:   02/23/2020  Start Time:   8 AM End Time:   9 AM  Provider/Observer:  Ilean Skill, Psy.D.       Clinical Neuropsychologist       Billing Code/Service: [phone]  Chief Complaint:    Jon Townsend is a 65 year old male with a history of type 2 diabetes, hypertension, BPH.  Patient presented to the emergency department on 02/03/2020 with a 48-hour.  Of progressive weakness bilateral lower extremity greater than the bilateral upper extremity and paraesthesias, numbness bilateral in his hands and feet and inability to walk.  Patient was found to be COVID-19 positive and reported having headaches, URI symptoms as well as anorexia.  Work-up revealed neurological symptoms concerning for Jon Townsend syndrome and attempts at LP unsuccessful.  Patient received IVIG x5 doses as neurology felt most likely diagnosis was GBS in setting of Covid infection.  MRI spine done as part of work-up showing degenerative spondylosis with foraminal stenosis C5/C6, multilevel degenerative spondylosis lumbar spine with moderate L 2/3-L3/4.  Patient continues to have weakness with sensory deficits affecting ADLs and mobility.  Reason for Service:  Patient was referred for neuropsychological consultation due to coping and adjustment in the setting of status post COVID-19 infection likely triggering [name].  Below is the HPI for the current admission for more complete medical information.  HPI: Jon Townsend is a 65 year old male with history of T2DM, HTN, BPH who presented toED on 02/02/29 with 48 hours of progressive weakness BLE>BUE and paraesthesias, numbness bilateral hands and feet  and inability to walk. He was found to be Covid + and reported having HA, URI symptoms as well as anorexia. Work up revealed areflexia with tetraplegia concerning for GBS and attempts at LP unsuccessful. He received IVIG X 5 doses as neurology felt that patient with GBS in setting of Covid infection. MRI spine done as part of work up an showed degenerative spondylosis with foraminal stenosis C5/C6, multilevel degenerative spondylosis lumbar spine with moderate spinal stenosis L2/3-L3/4 --incidental soft tisuse edema noted within posterior R>L paraspinal musculature most likely related to LP attempts.    Hyponatremia managed with fluid restriction. Foley placed due to urinary retention and remains in place. He had issues with severe constipation and KUB showed concerns of ileus. CT abdomen/pelvis ordered for work up due to abdominal pain with blood in stool. This revealed small bowel thickening question chronic v/s due to colitis, small amount of stool in colon and diffuse hepatic steatosis with hepatic cysts.  He continued to have diarrhea with resultant worsening of hyponatremia with AKI due to GI losses which has improved with fluid boluses. H/H being monitored and with drop 15-->13.6. On 02/07, he developed thrombocytopenia ( plt-100) with leucocytosis (WBC 16.9)  being monitored and has resolved. Po intake improving with rise in BS. He continues to have weakness with sensory deficits affecting ADLs and mobility. CIR recommended due to functional decline.   Current Status:  Upon entering the room, the patient was sitting in his wheelchair alert and oriented with good mentation.  Patient denied any significant emotional distress related to significant depression or anxiety beyond acute psychosocial stressors  associated with inability to see other family members.  Covid restrictions in the hospital only allow for 1 family visitor.  Patient described issues with his wife who had been on the rehab unit 1 year  prior after pelvic fracture and is limited in her ability to come visit and she is his visitor.  Patient has been unable to see his son throughout his hospitalization.  This has been very distressing to him but visitation rules limit to 1 visitor.  Patient reports that he has seen progress in his recovery from Jon Townsend.  We spent considerable time going over what Jon Townsend he is and what the expectations are going forward and limitations in predicting recovery course.  Patient reports that he is working hard in therapies and beyond issues associated with limitations in visitation he is coping overall with what is going on.  Behavioral Observation: Jon Townsend  presents as a 65 y.o.-year-old Right Caucasian Male who appeared his stated age. his dress was Appropriate and he was Well Groomed and his manners were Appropriate to the situation.  his participation was indicative of Appropriate and Attentive behaviors.  There were physical disabilities noted.  he displayed an appropriate level of cooperation and motivation.     Interactions:    Active Appropriate and Attentive  Attention:   within normal limits and attention span and concentration were age appropriate  Memory:   within normal limits; recent and remote memory intact  Visuo-spatial:  not examined  Speech (Volume):  normal  Speech:   normal; normal  Thought Process:  Coherent and Relevant  Though Content:  WNL; not suicidal and not homicidal  Orientation:   person, place, time/date and situation  Judgment:   Good  Planning:   Good  Affect:    Appropriate  Mood:    Dysphoric  Insight:   Good  Intelligence:   normal  Medical History:   Past Medical History:  Diagnosis Date  . Allergy   . BPH (benign prostatic hyperplasia)   . Diabetes mellitus type 2 in obese ([place])   . Glaucoma   . Hypertension          Patient Active Problem List   Diagnosis Date Noted  . Bloody stool   . Hyponatremia   . Controlled  type 2 diabetes mellitus with hyperglycemia, without long-term current use of insulin ([place])   . Ileus, postoperative ([place])   . Constipation   . Abdominal distension   . Abnormal CT scan, gastrointestinal tract   . Guillain Barr syndrome ([place]) 02/19/2020  . Weakness 02/03/2020  . Essential hypertension 02/03/2020  . Polyneuropathy 02/03/2020  . COVID-19 virus infection 02/03/2020  . Diabetes mellitus type 2 in obese ([place])   . BPH (benign prostatic hyperplasia)      Psychiatric History:  No prior psychiatric history  Family Med/Psych History:  Family History  Problem Relation Age of Onset  . Cancer Father   . Early death Father   . Diabetes Sister   . Heart disease Sister   . COPD Sister   . Diabetes Sister   . Colon cancer Neg Hx   . Esophageal cancer Neg Hx   . Stomach cancer Neg Hx   . Rectal cancer Neg Hx    Impression/DX:  Jon Townsend is a 65 year old male with a history of type 2 diabetes, hypertension, BPH.  Patient presented to the emergency department on 02/03/2020 with a 48-hour.  Of progressive weakness bilateral lower extremity greater than the bilateral upper  extremity and paraesthesias, numbness bilateral in his hands and feet and inability to walk.  Patient was found to be COVID-19 positive and reported having headaches, URI symptoms as well as anorexia.  Work-up revealed neurological symptoms concerning for Jon Townsend syndrome and attempts at LP unsuccessful.  Patient received IVIG x5 doses as neurology felt most likely diagnosis was GBS in setting of Covid infection.  MRI spine done as part of work-up showing degenerative spondylosis with foraminal stenosis C5/C6, multilevel degenerative spondylosis lumbar spine with moderate L 2/3-L3/4.  Patient continues to have weakness with sensory deficits affecting ADLs and mobility.  Upon entering the room, the patient was sitting in his wheelchair alert and oriented with good mentation.  Patient denied any significant  emotional distress related to significant depression or anxiety beyond acute psychosocial stressors associated with inability to see other family members.  Covid restrictions in the hospital only allow for 1 family visitor.  Patient described issues with his wife who had been on the rehab unit 1 year prior after pelvic fracture and is limited in her ability to come visit and she is his visitor.  Patient has been unable to see his son throughout his hospitalization.  This has been very distressing to him but visitation rules limit to 1 visitor.  Patient reports that he has seen progress in his recovery from Jon Townsend.  We spent considerable time going over what Jon Townsend he is and what the expectations are going forward and limitations in predicting recovery course.  Patient reports that he is working hard in therapies and beyond issues associated with limitations in visitation he is coping overall with what is going on.  Disposition/Plan:  Today we worked on coping and adjustment issues with lengthy hospital stay status post development of Jon Townsend syndrome secondary to COVID-19 infection.  Diagnosis:    Polyneuropathy - Plan: Ambulatory referral to Physical Medicine Rehab  Abdominal distension  Guillain Barr syndrome [facility]) - Plan: Ambulatory referral to Physical Medicine Rehab         Electronically Signed   _______________________ Ilean Skill, Psy.D. Clinical Neuropsychologist

## 2020-02-23 NOTE — Progress Notes (Signed)
[facility] PHYSICAL MEDICINE & REHABILITATION PROGRESS NOTE  Subjective/Complaints: Patient seen sitting up in a chair this morning.  He states he slept fairly overnight.  He notes he is having bowel movements now.  Denies blood in stool.  He is questions regarding his Foley.  He was seen by GI over the weekend, notes reviewed-MiraLAX increased and Relistor as well as narcotics DC'd.  Patient has questions regarding his wife being able to visit along with his son after work.  Discussed policies with patient, he states that he knows exceptions can be made and is not pleased.  ROS: Denies CP, SOB, N/V/D  Objective: Vital Signs: Blood pressure 131/74, pulse 80, temperature 97.6 F (36.4 C), temperature source Oral, resp. rate 18, height 6\' 1"  (1.854 m), weight 108.7 kg, SpO2 98 %. No results found. Recent Labs    02/23/20 0516  WBC 6.0  HGB 12.8*  HCT 36.9*  PLT 173   Recent Labs    02/23/20 0516  NA 137  K 3.5  CL 100  CO2 29  GLUCOSE 172*  BUN 14  CREATININE 0.94  CALCIUM 8.2*    Intake/Output Summary (Last 24 hours) at 02/23/2020 0843 Last data filed at 02/23/2020 0442 Gross per 24 hour  Intake 240 ml  Output 2500 ml  Net -2260 ml        Physical Exam: BP 131/74 (BP Location: Left Arm)   Pulse 80   Temp 97.6 F (36.4 C) (Oral)   Resp 18   Ht 6\' 1"  (1.854 m)   Wt 108.7 kg   SpO2 98%   BMI 31.62 kg/m  Constitutional: No distress . Vital signs reviewed. HENT: Normocephalic.  Atraumatic. Eyes: EOMI. No discharge. Cardiovascular: No JVD.  RRR. Respiratory: Normal effort.  No stridor.  Bilateral clear to auscultation. GI: Non-distended.  BS +. Skin: Warm and dry.  Intact. Psych: Normal mood.  Normal behavior. Musc: No edema in extremities.  No tenderness in extremities. Neuro: Alert Motor: Bilateral upper extremities: 5/5 proximal distally  Bilateral lower extremities: Hip flexion, knee extension 4/5, ankle dorsiflexion 4/5 Sensory 1+/2 bilateral LE's--no  changes  Assessment/Plan: 1. Functional deficits which require 3+ hours per day of interdisciplinary therapy in a comprehensive inpatient rehab setting.  Physiatrist is providing close team supervision and 24 hour management of active medical problems listed below.  Physiatrist and rehab team continue to assess barriers to discharge/monitor patient progress toward functional and medical goals   Care Tool:  Bathing    Body parts bathed by patient: Right arm,Left arm,Chest,Abdomen,Face   Body parts bathed by helper: Front perineal area,Buttocks,Right upper leg,Left upper leg,Right lower leg,Left lower leg     Bathing assist Assist Level: 2 Helpers     Upper Body Dressing/Undressing Upper body dressing   What is the patient wearing?: Pull over shirt    Upper body assist Assist Level: Supervision/Verbal cueing    Lower Body Dressing/Undressing Lower body dressing      What is the patient wearing?: Pants,Incontinence brief     Lower body assist Assist for lower body dressing: Moderate Assistance - Patient 50 - 74%     Toileting Toileting    Toileting assist Assist for toileting: Maximal Assistance - Patient 25 - 49%     Transfers Chair/bed transfer  Transfers assist     Chair/bed transfer assist level: Moderate Assistance - Patient 50 - 74%     Locomotion Ambulation   Ambulation assist   Ambulation activity did not occur: Safety/medical concerns (fatigue,  generalized weakness, poor endurance)  Assist level: Minimal Assistance - Patient > 75% Assistive device: Walker-rolling Max distance: 20ft   Walk 10 feet activity   Assist  Walk 10 feet activity did not occur: Safety/medical concerns (fatigue, generalized weakness, poor endurance)  Assist level: 2 helpers Assistive device: Walker-rolling   Walk 50 feet activity   Assist Walk 50 feet with 2 turns activity did not occur: Safety/medical concerns (fatigue, generalized weakness, poor endurance)          Walk 150 feet activity   Assist Walk 150 feet activity did not occur: Safety/medical concerns (fatigue, generalized weakness, poor endurance)         Walk 10 feet on uneven surface  activity   Assist Walk 10 feet on uneven surfaces activity did not occur: Safety/medical concerns (fatigue, generalized weakness, poor endurance)         Wheelchair     Assist Will patient use wheelchair at discharge?: Yes Type of Wheelchair: Manual Wheelchair activity did not occur: Safety/medical concerns (fatigue, generalized weakness, poor endurance)  Wheelchair assist level: Supervision/Verbal cueing Max wheelchair distance: 17ft    Wheelchair 50 feet with 2 turns activity    Assist    Wheelchair 50 feet with 2 turns activity did not occur: Safety/medical concerns (fatigue, generalized weakness, poor endurance)   Assist Level: Supervision/Verbal cueing   Wheelchair 150 feet activity     Assist  Wheelchair 150 feet activity did not occur: Safety/medical concerns (fatigue, generalized weakness, poor endurance)       Medical Problem List and Plan: 1.  Impaired strength, ADLs, function, mobility secondary to Guillain Barre syndrome from COVID- 2nd time having COIVD  Continue CIR 2.  Antithrombotics: -DVT/anticoagulation:  Pharmaceutical: Lovenox  -antiplatelet therapy: N/A 3. Pain Management:Increased gabapentin to 300 mg TID due to nerve pain             Oxycodone prn (holding d/t #8) DC'd  Monitor with increased exertion 4. Mood: LCSW to follow for evaluation and support.              -antipsychotic agents: N/A 5. Neuropsych: This patient is capable of making decisions on her own behalf. 6. Skin/Wound Care: Routine pressure relief measures. 7. Fluids/Electrolytes/Nutrition: Monitor I/Os.  8.  Ileus: Resolved  Appreciate GI Recs 9. HTN: Monitor BP tid--continue Norvasc and lopressor  Controlled on 2/14 10. H/o BPH/Neurogenic bladder:   Increased Flomax  to 0.8 mg daily  DC Foley today with voiding trial 11. T2DM with hyperglycemia: Monitor BS ac/hs  Diet change to carb modified on 2/14  Elevated on 2/14  Monitor with increased ambulation 12. Hyponatremia: Resolved  Sodium 137 on 2/14 13. Bloody stools:   Hemoccult positive  Hemoglobin 12.8 on 2/14 14. Anorexia/ Low protein stores: Added prostat and offer Ensure Max if tolerated. 15. Eye infection: Symptoms improving but still erythematous              Continue allegra and polytrim eye drops   LOS: 4 days A FACE TO FACE EVALUATION WAS PERFORMED  Matalyn Nawaz Lorie Phenix 02/23/2020, 8:43 AM

## 2020-02-23 NOTE — Progress Notes (Signed)
Occupational Therapy Session Note  Patient Details  Name: Jon Townsend MRN: [id number] Date of Birth: [birth year]  Today's Date: 02/23/2020 OT Individual Time: 1035-1130 OT Individual Time Calculation (min): 55 min    Short Term Goals: Week 1:  OT Short Term Goal 1 (Week 1): Pt will complete sit > stand mod assist of one caregiver in preparation for LB dressing OT Short Term Goal 2 (Week 1): Pt will complete LB dressing at sit > stand level with mod assist OT Short Term Goal 3 (Week 1): Pt will complete toilet transfer with mod assist of one caregiver OT Short Term Goal 4 (Week 1): Pt will complete toileting with max assist of one caregiver  Skilled Therapeutic Interventions/Progress Updates:    Treatment session with focus on self-care retraining, functional transfers, sit > stand, and activity tolerance.  Pt received upright in w/c reporting increased mobility during PT this AM.  Pt expressing desire to shower. Completed stand pivot transfer w/c > tub bench in room shower with CGA and use of grab bars.  Pt able to wash majority of body while seated on tub bench, did need assistance to wash feet and buttocks in standing. Pt completed sit > stand with min assist and use of grab bars.  Pt tolerated standing with UE support while therapist washed buttocks and back.  Pt completed stand pivot transfer back to w/c with CGA with use of grab bars.  Engaged in dressing at sit > stand level with pt demonstrating improved ability to stand from w/c with arm rests with CGA.  Pt able to pull pants over hips while standing with CGA, did require assist to thread R pant leg.  Pt attempted to don L sock in figure 4 position requiring backward chaining.  Pt able to utilize sock aid to don R sock. Engaged in discussion regarding typical recovery with GBS diagnosis reiterating previous discussion with neuro psychologist as well as progress to this point with therapies.  Pt pleased with progress and reports motivation  to recover as much as possible.  Pt remained upright in w/c with seat belt alarm on and all needs in reach.  Therapy Documentation Precautions:  Precautions Precautions: Fall Precaution Comments: off COVID precautions as of 02/13/20 Restrictions Weight Bearing Restrictions: No General:   Vital Signs: Therapy Vitals Temp: 98.1 F (36.7 C) Pulse Rate: 85 Resp: 14 BP: 132/74 Patient Position (if appropriate): Lying Oxygen Therapy SpO2: 97 % O2 Device: Room Air Pain:  Pt with no c/o pain  Therapy/Group: Individual Therapy  Simonne Come 02/23/2020, 2:17 PM

## 2020-02-23 NOTE — Progress Notes (Signed)
Patient ID: Jon Townsend, male   DOB: 1955/08/11, 65 y.o.   MRN: [id number]  Met with pt and wife who is in his room to see how he was progressing. He is much brighter and eating a regular diet now. He feels he is doing well and only concern is that one visitor can come in and not switch off each day. Discussed this is the hospital rule due to [place]. He hopes to go home soon. Will have team conference Wed. He will need to be fairly independent since wife ambulates with a cane due to her pelvic fracture from last 10/2019

## 2020-02-23 NOTE — Progress Notes (Signed)
Physical Therapy Session Note  Patient Details  Name: Jon Townsend MRN: [id number] Date of Birth: 08/22/1955  Today's Date: 02/23/2020 PT Individual Time: 1000-1030 PT Individual Time Calculation (min): 30 min   Short Term Goals: Week 1:  PT Short Term Goal 1 (Week 1): pt will perform all bed mobility with CGA PT Short Term Goal 2 (Week 1): pt will transfer sit<>stand with LRAD and min A PT Short Term Goal 3 (Week 1): pt will transfer bed<>chair with LRAD and min A  Skilled Therapeutic Interventions/Progress Updates:    Patient seated upright in w/c upon PT arrival. Patient alert and agreeable to PT session. Patient denied pain during session. Call light had been recently activated as pt wanted to remind RN to remove foley catheter.   Therapeutic Activity:  Transfers: Prior to initial rise to stand, pt relates to this PT that he has been needing assistance to rise to stand from chair. Pt instructed to lean forward and to use BUE to push from Bil armrests when rising to stand. Patient performed STS to RW with CGA. Pt is able to perform STS and SPVT throughout rest of session with CGA and minimal vc for technique.  Pt guided in minisquats 2 x5 reps for functional strengthening and improved tolerance to activity.   Gait Training:  Patient ambulated 210' x1 using RW and CGA with w/c follow for fatigue. Ambulated with decreased step height/ length, slow pace. Reaching midway point, pt states he is uneasy as he has not walked this far up to this point in recovery. Pt instructed in standing rest break with several abdominal breaths prior to resuming ambulation and educating pt on energy conservation. Throughout return bout to room, pt reminded re: availbaility of w/c and given vc for decreasing BUE pressure into walker and maintaining control of BLE. After seated rest, pt guided in gait training at hallway HR to pt's L side and ambulates 30 feet with only LUE support and CGA. Pt states that it  shouldn't "feel funny but it does".   Patient seated in w/c at end of session with brakes locked, belt alarm set, and all needs within reach awaiting OT for next session.     Therapy Documentation Precautions:  Precautions Precautions: Fall Precaution Comments: off COVID precautions as of 02/13/20 Restrictions Weight Bearing Restrictions: No   Therapy/Group: Individual Therapy  Alger Simons 02/23/2020, 4:10 PM

## 2020-02-23 NOTE — Progress Notes (Signed)
Physical Therapy Session Note  Patient Details  Name: Jon Townsend MRN: [id number] Date of Birth: [birth year]  Today's Date: 02/23/2020 PT Individual Time: 1415-1510 PT Individual Time Calculation (min): 55 min   Short Term Goals: Week 1:  PT Short Term Goal 1 (Week 1): pt will perform all bed mobility with CGA PT Short Term Goal 2 (Week 1): pt will transfer sit<>stand with LRAD and min A PT Short Term Goal 3 (Week 1): pt will transfer bed<>chair with LRAD and min A  Skilled Therapeutic Interventions/Progress Updates:    Pt received supine in bed, wife at bedside, pt agreeable to therapy. No reports of pain but does endorse moderate fatigue from busy day of therapies. Supine<>sit with supervision with use of bed features. Required minA for donning slip-on shoes while seated EOB. Stand<>pivot with minA and no AD from EOB to w/c with difficulty producing power to rise. Changed w/c to higher sitting chair as pt is 6'1 which pt reports improved comfort. Pt propelled himself with supervision with BUE's to main rehab gym, ~166ft. Pt reporting significant fatigue after completing this with shortness of breath noted, recovers quickly with seated rest break. Stand<>pivot with minA from w/c to mat table in similar fashion as able. Performed repeated sit<>stands, 1xx5, from mat table to RW, focusing on forward weight shift and hand placement. Then performed standing overhead reaching with 1kg med ball and minA guard, 2x10 reps (seated rest b/w efforts). Performed seated 1kg med ball toss in forward and lateral directions focusing on endurance, sitting balance, and core facilitation. Performed seated toe taps on 6inch cones, working on hip flexion strength and coordination as well as endurance; noted compensatory posterior trunk lean while completing this with cues for corrections. He then completed this same activity in standing, 2x30second bouts. Standing ball toss to rebounder with minA guard, working on  righting reactions, standing endurance, and dynamic standing balance. Pt reporting fatigue and requesting to return to his room. Stand<>pivot with minA back to his w/c and he was transported with totalA for energy conservation back to his room. Education provided regarding GBS, rehab considerations, benefits of strength/endurance training as well as functional training. Pt becoming emotional and tearful regarding his dx and his anxiousness to return home as soon as possible. Words of encouragement provided. He ended session seated in w/c with safety belt alarm on and needs within reach. Pt missed 20 minutes of skilled therapy due to fatigue.   Therapy Documentation Precautions:  Precautions Precautions: Fall Precaution Comments: off COVID precautions as of 02/13/20 Restrictions Weight Bearing Restrictions: No General: PT Amount of Missed Time (min): 20 Minutes PT Missed Treatment Reason: Patient fatigue  Therapy/Group: Individual Therapy  Beadie Matsunaga P Jenia Klepper  PT 02/23/2020, 7:34 AM

## 2020-02-24 LAB — GLUCOSE, CAPILLARY
Glucose-Capillary: 183 mg/dL — ABNORMAL HIGH (ref 70–99)
Glucose-Capillary: 177 mg/dL — ABNORMAL HIGH (ref 70–99)
Glucose-Capillary: 142 mg/dL — ABNORMAL HIGH (ref 70–99)
Glucose-Capillary: 167 mg/dL — ABNORMAL HIGH (ref 70–99)

## 2020-02-24 NOTE — Progress Notes (Signed)
Occupational Therapy Session Note  Patient Details  Name: Jon Townsend MRN: [id number] Date of Birth: 05-13-1955  Today's Date: 02/24/2020 OT Individual Time: 1033-1130 OT Individual Time Calculation (min): 57 min    Short Term Goals: Week 1:  OT Short Term Goal 1 (Week 1): Pt will complete sit > stand mod assist of one caregiver in preparation for LB dressing OT Short Term Goal 2 (Week 1): Pt will complete LB dressing at sit > stand level with mod assist OT Short Term Goal 3 (Week 1): Pt will complete toilet transfer with mod assist of one caregiver OT Short Term Goal 4 (Week 1): Pt will complete toileting with max assist of one caregiver  Skilled Therapeutic Interventions/Progress Updates:    Treatment session with focus on functional transfers, sit > stand, dynamic standing balance, and activity tolerance.  Pt received upright in w/c agreeable to therapy session.  Engaged in sit > stand with RW with CGA progressing to close supervision during session.  Engaged in ball toss in standing to challenge activity tolerance, endurance, and dynamic standing balance as well as balance reactions.  Therapist providing CGA to min assist for standing balance as he fatigued with pt tolerating ball toss 1-2 mins at a time before reporting BLE weakness.  Completed for 4 reps with improved balance reactions and awareness of oncoming fatigue.  Educated on energy conservation strategies while also focusing on increased endurance.  Pt tearful and expressing desire to d/c home asap as he is making good progress and would like to be home with his family.  Discussed current level and goal level as well as progress since eval with need to continue to assess with primary PT as well.  Plan to decide on d/c during conference tomorrow.  Engaged in [place] transfers in ADL tubroom with RW.  Pt ambulated in to bathroom with RW with CGA.  Completed transfer on/off tub bench after demonstration, requiring CGA for sit >  stand from tub bench.  Discussed plan to have grab bars installed in shower at home and already has tub bench and 3 in 1, as well as grab bar next to toilet.  Pt returned to room and remained upright in w/c with all needs in reach and seat belt alarm on.  Therapy Documentation Precautions:  Precautions Precautions: Fall Precaution Comments: off COVID precautions as of 02/13/20 Restrictions Weight Bearing Restrictions: No General:   Vital Signs: Therapy Vitals Temp: 98.6 F (37 C) Pulse Rate: 87 Resp: 18 BP: 118/76 Patient Position (if appropriate): Lying Oxygen Therapy SpO2: 100 % O2 Device: Room Air Pain:  Pt with no c/o pain   Therapy/Group: Individual Therapy  Simonne Come 02/24/2020, 2:54 PM

## 2020-02-24 NOTE — Progress Notes (Signed)
Physical Therapy Session Note  Patient Details  Name: Jon Townsend MRN: [id number] Date of Birth: Feb 13, 1955  Today's Date: 02/24/2020 PT Individual Time: 1300-1353 PT Individual Time Calculation (min): 53 min   Short Term Goals: Week 1:  PT Short Term Goal 1 (Week 1): pt will perform all bed mobility with CGA PT Short Term Goal 2 (Week 1): pt will transfer sit<>stand with LRAD and min A PT Short Term Goal 3 (Week 1): pt will transfer bed<>chair with LRAD and min A  Skilled Therapeutic Interventions/Progress Updates:   Received pt sitting in WC, pt agreeable to therapy, and denied any pain during session. Pt emotional during session and strongly expressing desire to leave ASAP. Encouraged pt to complete necessary tasks to ensure safe D/C home first and pt agreeable. Session with emphasis on functional mobility/transfers, generalized strengthening, dynamic standing balance/coordination, simulated car transfers, stair navigation, ambulation, and improved activity tolerance. Pt transported to therapy gym in [facility] total A for energy conservation purposes and navigated 1 6in curb with RW and min A x 2 trials with cues for technique and RW management. Pt with mild LOB when turning but able to self-correct. Pt transported to ortho gym in [facility] total A and performed ambulatory simulated car transfer with RW and CGA. Pt ambulated 11ft on uneven surfaces (ramp) with RW and CGA. Pt transported to dayroom in [facility] total A and ambulated 31ft with RW and CGA/close supervision with no instances of R knee buckling. Pt performed BUE/LE strengthening on Nustep at workload 2 for 8 minutes for a total of 611 steps for improved cardiovascular endurance with 1 rest break. Stand<>pivot Nustep<>WC with RW and CGA. Discussion had regarding moving up D/C to this weekend and treatment team made aware. Pt reported he is unsure if he has a RW or standard walker at home but will confirm with his wife. Pt transported back to room in [facility]  total A. Concluded session with pt sitting in WC, needs within reach, and seatbelt alarm on.   Therapy Documentation Precautions:  Precautions Precautions: Fall Precaution Comments: off COVID precautions as of 02/13/20 Restrictions Weight Bearing Restrictions: No  Therapy/Group: Individual Therapy Alfonse Alpers PT, DPT   02/24/2020, 7:26 AM

## 2020-02-24 NOTE — Progress Notes (Signed)
Patient ID: Jon Townsend, male   DOB: [birth year], 65 y.o.   MRN: [id number] Discussed with pt follow up he would like to use the agency wife used [place]. Have reached out to [facility] they can accept referral. Will email order and pt to let worker know if needs a rolling walker. Checking with wife regarding this.

## 2020-02-24 NOTE — Progress Notes (Signed)
PHYSICAL MEDICINE & REHABILITATION PROGRESS NOTE  Subjective/Complaints: Patient seen sitting up in bed this morning.  He states he slept fairly overnight because he was restless.  He thinks it is because he was tired from all the therapies yesterday.  He is apologetic about yesterday, but states that he wants to go home and feels as if he will do better at home than in the hospital.  ROS: Denies CP, SOB, N/V/D  Objective: Vital Signs: Blood pressure 112/64, pulse 68, temperature 97.8 F (36.6 C), resp. rate 16, height 6\' 1"  (1.854 m), weight 108.7 kg, SpO2 97 %. No results found. Recent Labs    02/23/20 0516  WBC 6.0  HGB 12.8*  HCT 36.9*  PLT 173   Recent Labs    02/23/20 0516  NA 137  K 3.5  CL 100  CO2 29  GLUCOSE 172*  BUN 14  CREATININE 0.94  CALCIUM 8.2*    Intake/Output Summary (Last 24 hours) at 02/24/2020 0844 Last data filed at 02/24/2020 0700 Gross per 24 hour  Intake 720 ml  Output 600 ml  Net 120 ml        Physical Exam: BP 112/64 (BP Location: Right Arm)   Pulse 68   Temp 97.8 F (36.6 C)   Resp 16   Ht 6\' 1"  (1.854 m)   Wt 108.7 kg   SpO2 97%   BMI 31.62 kg/m  Constitutional: No distress . Vital signs reviewed. HENT: Normocephalic.  Atraumatic. Eyes: EOMI. No discharge. Cardiovascular: No JVD.  RRR. Respiratory: Normal effort.  No stridor.  Bilateral clear to auscultation. GI: Non-distended.  BS +. Skin: Warm and dry.  Intact. Psych: Normal mood.  Normal behavior. Musc: No edema in extremities.  No tenderness in extremities. Neuro: Alert Motor: Bilateral upper extremities: 5/5 proximal distally  Bilateral lower extremities: Hip flexion, knee extension 4-4+/5, ankle dorsiflexion 4-4+/5  Assessment/Plan: 1. Functional deficits which require 3+ hours per day of interdisciplinary therapy in a comprehensive inpatient rehab setting.  Physiatrist is providing close team supervision and 24 hour management of active medical  problems listed below.  Physiatrist and rehab team continue to assess barriers to discharge/monitor patient progress toward functional and medical goals   Care Tool:  Bathing    Body parts bathed by patient: Right arm,Left arm,Chest,Abdomen,Face,Front perineal area,Right upper leg,Left upper leg   Body parts bathed by helper: Buttocks,Right lower leg,Left lower leg     Bathing assist Assist Level: Moderate Assistance - Patient 50 - 74%     Upper Body Dressing/Undressing Upper body dressing   What is the patient wearing?: Pull over shirt    Upper body assist Assist Level: Supervision/Verbal cueing    Lower Body Dressing/Undressing Lower body dressing      What is the patient wearing?: Pants,Underwear/pull up     Lower body assist Assist for lower body dressing: Moderate Assistance - Patient 50 - 74%     Toileting Toileting    Toileting assist Assist for toileting: Maximal Assistance - Patient 25 - 49%     Transfers Chair/bed transfer  Transfers assist     Chair/bed transfer assist level: Contact Guard/Touching assist     Locomotion Ambulation   Ambulation assist   Ambulation activity did not occur: Safety/medical concerns (fatigue, generalized weakness, poor endurance)  Assist level: Contact Guard/Touching assist Assistive device: Walker-rolling Max distance: 210   Walk 10 feet activity   Assist  Walk 10 feet activity did not occur: Safety/medical concerns (fatigue, generalized weakness,  poor endurance)  Assist level: Contact Guard/Touching assist Assistive device: Walker-rolling   Walk 50 feet activity   Assist Walk 50 feet with 2 turns activity did not occur: Safety/medical concerns (fatigue, generalized weakness, poor endurance)  Assist level: Contact Guard/Touching assist      Walk 150 feet activity   Assist Walk 150 feet activity did not occur: Safety/medical concerns (fatigue, generalized weakness, poor endurance)  Assist level:  Contact Guard/Touching assist Assistive device: Walker-rolling    Walk 10 feet on uneven surface  activity   Assist Walk 10 feet on uneven surfaces activity did not occur: Safety/medical concerns (fatigue, generalized weakness, poor endurance)         Wheelchair     Assist Will patient use wheelchair at discharge?: Yes Type of Wheelchair: Manual Wheelchair activity did not occur: Safety/medical concerns (fatigue, generalized weakness, poor endurance)  Wheelchair assist level: Supervision/Verbal cueing Max wheelchair distance: 41ft    Wheelchair 50 feet with 2 turns activity    Assist    Wheelchair 50 feet with 2 turns activity did not occur: Safety/medical concerns (fatigue, generalized weakness, poor endurance)   Assist Level: Supervision/Verbal cueing   Wheelchair 150 feet activity     Assist  Wheelchair 150 feet activity did not occur: Safety/medical concerns (fatigue, generalized weakness, poor endurance)       Medical Problem List and Plan: 1.  Impaired strength, ADLs, function, mobility secondary to Guillain Barre syndrome from COVID- 2nd time having COIVD  Continue CIR 2.  Antithrombotics: -DVT/anticoagulation:  Pharmaceutical: Lovenox  -antiplatelet therapy: N/A 3. Pain Management:Increased gabapentin to 300 mg TID due to nerve pain             Oxycodone prn (holding d/t #8) DC'd  Controlled on 2/15  Monitor with increased exertion 4. Mood: LCSW to follow for evaluation and support.              -antipsychotic agents: N/A 5. Neuropsych: This patient is capable of making decisions on her own behalf. 6. Skin/Wound Care: Routine pressure relief measures. 7. Fluids/Electrolytes/Nutrition: Monitor I/Os.  8.  Ileus: Resolved  Appreciate GI Recs 9. HTN: Monitor BP tid--continue Norvasc and lopressor  Controlled on 2/15 10. H/o BPH/Neurogenic bladder:   Increased Flomax to 0.8 mg daily  DC Foley today with voiding trial-appears to be voiding, PVRs  ordered 11. T2DM with hyperglycemia: Hemoglobin A1c 7.7 on 1/25.  Monitor BS ac/hs  Diet change to carb modified on 2/14  Elevated on 2/15  Monitor with increased ambulation 12. Hyponatremia: Resolved  Sodium 137 on 2/14 13. Bloody stools:   Hemoccult positive  Hemoglobin 12.8 on 2/14, plan to order labs for later this week 14. Anorexia/ Low protein stores: Added prostat and offer Ensure Max if tolerated. 15. Eye infection: Symptoms improving but still erythematous              Continue allegra and polytrim eye drops   LOS: 5 days A FACE TO FACE EVALUATION WAS PERFORMED  Crystina Borrayo Lorie Phenix 02/24/2020, 8:44 AM

## 2020-02-24 NOTE — Progress Notes (Signed)
Physical Therapy Session Note  Patient Details  Name: Jon Townsend MRN: [id number] Date of Birth: Aug 24, 1955  Today's Date: 02/24/2020 PT Individual Time: 7035-0093 PT Individual Time Calculation (min): 47 min   Short Term Goals: Week 1:  PT Short Term Goal 1 (Week 1): pt will perform all bed mobility with CGA PT Short Term Goal 2 (Week 1): pt will transfer sit<>stand with LRAD and min A PT Short Term Goal 3 (Week 1): pt will transfer bed<>chair with LRAD and min A  Skilled Therapeutic Interventions/Progress Updates:    Pt received sitting in w/c and agreeable to therapy session. Pt reports primary team discussing shorter LOS with earlier D/C than originally planned at evaluation therefore therapy session focused on B LE strengthening, gait training, and education for D/C.  Transported to/from gym in w/c for time management and energy conservation. Sit<>stands using RW with CGA for steadying using UEs to assist with coming to stand. Gait training ~159ft using RW with primarily CGA but 1x mod assist to prevent posterior LOB when turning to step back towards w/c - demos slow gait speed with reciprocal stepping pattern but decreased B LE foot clearance and increased trunk flexion - cuing for improvement (pt reports at baseline has increased R LE ER compared to L LE). Educated pt on importance of taking time and being conscious of LE stepping when turning to ensure maintaining balance. B LE functional strengthening via 2x8reps repeated sit<>stands to/from elevated EOM with minimal UE support - requires min assist for lifting and balance with delayed anterior weight shift and hip extension activation - cuing for improvement. Lateral side stepping in // bars with R UE support and CGA/min assist for balance with pt experiencing increased fatigue with this and demoing slower speed of movement and smaller step lengths. While standing in // bars without UE support pt demos increased postural sway - would  benefit from static standing balance retraining. No RW bags available at this time therefore educated pt on purpose and use with plan for him to use a bag at home. Gait training in ADL apartment kitchen using RW focusing on functional gait training, turning, and AD management when accessing counters/cabinets - requires CGA/min assist for steadying/balance throughout and therapist educated pt on safe use of AD. Discussed ensuring items in home are on counters and/or at an accessible height. Discussed energy conservation strategies and importance of planning out which activities he has each day to avoid excessive fatigue. Pt reporting fatigue towards end of session. Transported back to room and agreeable to remain sitting in w/c - left with needs in reach, seat belt alarm on, and NT present.  Therapy Documentation Precautions:  Precautions Precautions: Fall Precaution Comments: off COVID precautions as of 02/13/20 Restrictions Weight Bearing Restrictions: No  Pain: No reports of pain throughout session.   Therapy/Group: Individual Therapy  Tawana Scale , PT, DPT, CSRS  02/24/2020, 3:27 PM

## 2020-02-25 ENCOUNTER — Ambulatory Visit: Payer: BC Managed Care – PPO | Admitting: Family Medicine

## 2020-02-25 LAB — GLUCOSE, CAPILLARY
Glucose-Capillary: 161 mg/dL — ABNORMAL HIGH (ref 70–99)
Glucose-Capillary: 120 mg/dL — ABNORMAL HIGH (ref 70–99)
Glucose-Capillary: 146 mg/dL — ABNORMAL HIGH (ref 70–99)
Glucose-Capillary: 168 mg/dL — ABNORMAL HIGH (ref 70–99)

## 2020-02-25 MED ORDER — METFORMIN HCL ER 500 MG PO TB24
1000.0000 mg | ORAL_TABLET | Freq: Every day | ORAL | Status: DC
  Administered 2020-02-25 – 2020-02-27 (×3): 1000 mg via ORAL
  Filled 2020-02-25 (×3): qty 2

## 2020-02-25 NOTE — Plan of Care (Signed)
  Problem: Consults Goal: RH GENERAL PATIENT EDUCATION Description: See Patient Education module for education specifics. Outcome: Progressing   Problem: RH BOWEL ELIMINATION Goal: RH STG MANAGE BOWEL WITH ASSISTANCE Description: STG Manage Bowel with Assistance. Mod Outcome: Progressing   Problem: RH BLADDER ELIMINATION Goal: RH STG MANAGE BLADDER WITH EQUIPMENT WITH ASSISTANCE Description: STG Manage Bladder With Equipment With Assistance. Mod Outcome: Progressing   Problem: RH SKIN INTEGRITY Goal: RH STG MAINTAIN SKIN INTEGRITY WITH ASSISTANCE Description: STG Maintain Skin Integrity With Assistance. Mod Outcome: Progressing   Problem: RH PAIN MANAGEMENT Goal: RH STG PAIN MANAGED AT OR BELOW PT'S PAIN GOAL Description: Less than 5 Outcome: Progressing   Problem: RH KNOWLEDGE DEFICIT GENERAL Goal: RH STG INCREASE KNOWLEDGE OF SELF CARE AFTER HOSPITALIZATION Description: Patient/family will be able to verbalize self-care following rehab with cues/handouts Outcome: Progressing

## 2020-02-25 NOTE — Progress Notes (Signed)
Physical Therapy Session Note  Patient Details  Name: Jon Townsend MRN: [id number] Date of Birth: 18-Jan-1955  Today's Date: 02/25/2020 PT Individual Time: 1435-1500 PT Individual Time Calculation (min): 25 min   Short Term Goals: Week 1:  PT Short Term Goal 1 (Week 1): pt will perform all bed mobility with CGA PT Short Term Goal 2 (Week 1): pt will transfer sit<>stand with LRAD and min A PT Short Term Goal 3 (Week 1): pt will transfer bed<>chair with LRAD and min A  Skilled Therapeutic Interventions/Progress Updates:    Patient received sitting up in wc, agreeable to PT. He denies pain, but reports significant fatigue today. PT propelling patient in wc to therapy gym for time management and energy conservation. Based on previous PT sessions, patient with below average balance strategies, including ankle strategies. Ankle circles on cushion for increased coordination, 3x30s. Resisted ankle dorsiflexion, plantarflexion 2x10. Decreased coordination/strength noted in R ankle > L ankle. Patient returning to room in wc, call light within reach.   Therapy Documentation Precautions:  Precautions Precautions: Fall Precaution Comments: off COVID precautions as of 02/13/20 Restrictions Weight Bearing Restrictions: No    Therapy/Group: Individual Therapy  Karoline Caldwell, PT, DPT, CBIS  02/25/2020, 7:48 AM

## 2020-02-25 NOTE — Patient Care Conference (Signed)
Inpatient RehabilitationTeam Conference and Plan of Care Update Date: 02/25/2020   Time: 11:39 AM    Patient Name: Jon Townsend      Medical Record Number: [id number]  Date of Birth: [birth year] Sex: Male         Room/Bed: 4W13C/4W13C-01 Payor Info: Payor: [employer] MEDICARE / Plan: [employer] MEDICARE HMO/PPO / Product Type: *No Product type* /    Admit Date/Time:  02/19/2020  2:49 PM  Primary Diagnosis:  Guillain Barr syndrome [name])  Hospital Problems: Principal Problem:   Guillain Barr syndrome ([place]) Active Problems:   Bloody stool   Hyponatremia   Controlled type 2 diabetes mellitus with hyperglycemia, without long-term current use of insulin (HCC)   Ileus, postoperative (HCC)   Constipation   Abdominal distension   Abnormal CT scan, gastrointestinal tract    Expected Discharge Date: Expected Discharge Date: 02/27/20  Team Members Present: Physician leading conference: Dr. Delice Lesch Care Coodinator Present: Dorien Chihuahua, RN, BSN, CRRN;Becky Dupree, LCSW Nurse Present: Annita Brod, LPN PT Present: Becky Sax, PT OT Present: Simonne Come, OT PPS Coordinator present : Gunnar Fusi, SLP     Current Status/Progress Goal Weekly Team Focus  Bowel/Bladder   Continent of B&B. BM- 02/24/20.  Maintain continence.  Assess continence needs q shift.   Swallow/Nutrition/ Hydration             ADL's   Min A - CGA stand pivot transfers, CGA dynamic standing for LB dressing, Min A LB dressing, Min A bathing  Supervision overall  ADL retraining, transfers, dynamic standing balance, activity tolerance/endurance   Mobility   bed mobility supervision, transfers with RW CGA/min A, gait 243ft with RW CGA  supervision transfers and gait, CGA car, min A steps  functional mobility/transfers, generalized strengthening, dynamic standing balance/coordination, ambulation, and imporved endurance   Communication             Safety/Cognition/ Behavioral Observations            Pain   Pain  in all extremities from 5-8/10. Tylenol effective.  Pain < or equal to 3/10.  Assess pain q shift and prn.   Skin   No skin issues.  Maintain skin integrity.  Assess skin q shift.     Discharge Planning:  Home with wife who is able to provide supervision level due to her recovery still from femur fracture in 10/2019. Pt wants to go home ASAP   Team Discussion: Ileus and GI bleeding resolved. Voiding without difficulty. CBGs elevated and MD restarted home medication. HTN controlled.  Patient on target to meet rehab goals: yes, currently CGA for transfers for toileting and supervision overall with ADLs, CGA for transfers with supervision goals set for discharge  *See Care Plan and progress notes for long and short-term goals.   Revisions to Treatment Plan:   Teaching Needs: Transfers,toileting, medications, energy conservation tips  Current Barriers to Discharge: Decreased caregiver support, Home enviroment access/layout and Wife recovering from hip surgery and uses a cane when ambulating  Possible Resolutions to Barriers: Family education Recommend HH follow up services     Medical Summary Current Status: Impaired strength, ADLs, function, mobility secondary to Guillain Barre syndrome from [place]- 2nd time having COVID  Barriers to Discharge: Medical stability;Weight   Possible Resolutions to [employer] Focus: Therapies, follow BMs, optimize DM meds   Continued Need for Acute Rehabilitation Level of Care: The patient requires daily medical management by a physician with specialized training in physical medicine and rehabilitation for the following reasons:  Direction of a multidisciplinary physical rehabilitation program to maximize functional independence : Yes Medical management of patient stability for increased activity during participation in an intensive rehabilitation regime.: Yes Analysis of laboratory values and/or radiology reports with any subsequent need for  medication adjustment and/or medical intervention. : Yes   I attest that I was present, lead the team conference, and concur with the assessment and plan of the team.   Dorien Chihuahua B 02/25/2020, 1:25 PM

## 2020-02-25 NOTE — Progress Notes (Signed)
Physical Therapy Session Note  Patient Details  Name: Jon Townsend MRN: [id number] Date of Birth: [birth year]  Today's Date: 02/25/2020 PT Individual Time: 1355-1430 PT Individual Time Calculation (min): 35 min   and  Today's Date: 02/25/2020 PT Missed Time: 10 Minutes Missed Time Reason: Patient fatigue  Short Term Goals: Week 1:  PT Short Term Goal 1 (Week 1): pt will perform all bed mobility with CGA PT Short Term Goal 2 (Week 1): pt will transfer sit<>stand with LRAD and min A PT Short Term Goal 3 (Week 1): pt will transfer bed<>chair with LRAD and min A  Skilled Therapeutic Interventions/Progress Updates:    Pt received sitting in w/c with his wife present and pt agreeable to therapy session. Transported to/from gym in w/c for time management and energy conservation. Pt reporting significant fatigue this afternoon stating the AM PT and OT sessions totally "wiped" him out. L stand pivot w/c>EOM using RW, CGA for steadying. Gait training ~17ft using RW with CGA for safety - pt demos improved safety and balance when turning this session with pt reporting he is more conscious of taking smaller turns - continues to demo slow gait speed though appropriate at this time. Sit<>stands to/from EOM, no AD, with CGA for steadying during session. Therapy session focused on standing balance and ankle balance recovery strategy while standing in normal stance without UE support performing the following challenges: -  eyes closed 10sec advanced to 30 sec - horizontal and vertical head turns x10 - cross body diagonal reaching - overhead reaching Pt demos mild postural sway with each of these tasks but is able to maintain balance without more than CGA for steadying. Does demo fatigue with standing requiring frequent seated rest breaks and reports muscle fatigue in bilateral lower legs due to utilizing ankle strategies throughout to maintain balance. Pt reports significant fatigue that is increasing. Gait  training ~34ft using RW with CGA as above. Transported back to room and left sitting in w/c with needs in reach.  Therapy Documentation Precautions:  Precautions Precautions: Fall Precaution Comments: off COVID precautions as of 02/13/20 Restrictions Weight Bearing Restrictions: No  Pain: No reports of pain throughout session.   Therapy/Group: Individual Therapy  Tawana Scale , PT, DPT, CSRS  02/25/2020, 12:21 PM

## 2020-02-25 NOTE — Progress Notes (Signed)
Patient ID: Jon Townsend, male   DOB: 03-12-1955, 65 y.o.   MRN: [id number] Met with pt and wife who is present to discuss team conference goals and discharge this Friday. He is pleased to be going home and feels he will be ready. He has all needed equipment and follow up set up via [facility].

## 2020-02-25 NOTE — Progress Notes (Signed)
Occupational Therapy Session Note  Patient Details  Name: Jon Townsend MRN: [id number] Date of Birth: 1955/03/23  Today's Date: 02/25/2020 OT Individual Time: 0930-1015 OT Individual Time Calculation (min): 45 min    Short Term Goals: Week 1:  OT Short Term Goal 1 (Week 1): Pt will complete sit > stand mod assist of one caregiver in preparation for LB dressing OT Short Term Goal 2 (Week 1): Pt will complete LB dressing at sit > stand level with mod assist OT Short Term Goal 3 (Week 1): Pt will complete toilet transfer with mod assist of one caregiver OT Short Term Goal 4 (Week 1): Pt will complete toileting with max assist of one caregiver  Skilled Therapeutic Interventions/Progress Updates:    Treatment session with focus on self-care retraining, functional transfers, and activity tolerance.  Pt received upright in w/c agreeable to shower.  Pt ambulated to shower with RW with CGA when navigating threshold in to bathroom and transfer to bench in walk-in shower.  Pt undressed at sit > stand level with supervision and completed all bathing at overall supervision level.  Pt relied heavily on grab bar for sit > stand to wash buttocks.  Pt required min assist to stand at end of session when exiting shower - would benefit from completing transfers to/from tub bench in ADL tubroom (per home setup).  Pt required assistance when donning underwear this session due to tight spaces in bathroom and fatigue from previous therapy session.  Pt able to complete remained of LB dressing with supervision at sit > stand level from w/c in room.  Discussed energy conservation strategies, spacing out activities to increase activity tolerance and independence. Pt pleased with progress and tearful when expressing desire to d/c home by the end of the week.  Discussed current level and progress towards goals with pt making good progress. Pt remained upright in w/c with all needs in reach and chair alarm on.  Therapy  Documentation Precautions:  Precautions Precautions: Fall Precaution Comments: off COVID precautions as of 02/13/20 Restrictions Weight Bearing Restrictions: No Pain: Pain Assessment Pain Scale: 0-10 Pain Score: 0-No pain   Therapy/Group: Individual Therapy  Simonne Come 02/25/2020, 12:51 PM

## 2020-02-25 NOTE — Progress Notes (Addendum)
Occupational Therapy Discharge Summary  Patient Details  Name: Jon Townsend MRN: [id number] Date of Birth: September 12, 1955   Patient has met 9 of 9 long term goals due to improved activity tolerance, improved balance, postural control, ability to compensate for deficits, improved awareness and improved coordination.  Patient to discharge at overall Supervision level.  Patient's care partner is independent to provide the necessary supervision assistance at discharge.    Reasons goals not met: N/A  Recommendation:  Patient will benefit from ongoing skilled OT services in home health setting to continue to advance functional skills in the area of BADL and Reduce care partner burden.  Equipment: No equipment provided.  Has all recommended equipment.  Reasons for discharge: treatment goals met and discharge from hospital  Patient/family agrees with progress made and goals achieved: Yes   OT Discharge Precautions/Restrictions  Precautions Precautions: Fall Precaution Comments: off COVID precautions as of 02/13/20 Pain Pain Assessment Pain Scale: 0-10 Pain Score: 0-No pain ADL ADL Equipment Provided: Sock aid,Long-handled shoe horn Eating: Independent Where Assessed-Eating: Chair Grooming: Setup Where Assessed-Grooming: Sitting at sink Upper Body Bathing: Supervision/safety Where Assessed-Upper Body Bathing: Shower Lower Body Bathing: Supervision/safety Where Assessed-Lower Body Bathing: Shower Upper Body Dressing: Supervision/safety Where Assessed-Upper Body Dressing: Wheelchair Lower Body Dressing: Supervision/safety Where Assessed-Lower Body Dressing: Wheelchair Toileting: Supervision/safety Where Assessed-Toileting: Bedside Commode Toilet Transfer: Close supervision Toilet Transfer Method: Proofreader: Bedside commode Tub/Shower Transfer: Close supervison Web designer Method: Ship broker: Transfer tub bench,Grab  bars Vision Baseline Vision/History: Wears glasses Wears Glasses: At all times Patient Visual Report: No change from baseline Vision Assessment?: No apparent visual deficits Perception  Perception: Within Functional Limits Praxis Praxis: Intact Cognition Overall Cognitive Status: Within Functional Limits for tasks assessed Arousal/Alertness: Awake/alert Orientation Level: Oriented X4 Attention: Selective Selective Attention: Appears intact Memory: Appears intact Awareness: Impaired Problem Solving: Appears intact Safety/Judgment: Appears intact Sensation Sensation Light Touch: Appears Intact Proprioception: Appears Intact Additional Comments: pt reports numbness, tingling, and coldness in bilateral hands and feet. Coordination Gross Motor Movements are Fluid and Coordinated: No Fine Motor Movements are Fluid and Coordinated: No Coordination and Movement Description: mild uncoordination due to generalized weakness, poor endurance, and decreased balance/postural control Finger Nose Finger Test: [facility] bilaterally Motor  Motor Motor: Abnormal postural alignment and control Motor - Skilled Clinical Observations: mild uncoordination due to generalized weakness, poor endurance, and decreased balance/postural control; improved since eval Mobility  Transfers Sit to Stand: Supervision/Verbal cueing Stand to Sit: Supervision/Verbal cueing  Trunk/Postural Assessment  Cervical Assessment Cervical Assessment: Within Functional Limits Thoracic Assessment Thoracic Assessment: Exceptions to [facility] (mild kyphosis) Lumbar Assessment Lumbar Assessment: Exceptions to [facility] (posterior pelvic tilt) Postural Control Postural Control: Deficits on evaluation  Balance Balance Balance Assessed: Yes Static Sitting Balance Static Sitting - Balance Support: Feet supported;No upper extremity supported Static Sitting - Level of Assistance: 7: Independent Dynamic Sitting Balance Dynamic Sitting -  Balance Support: Feet supported;No upper extremity supported Dynamic Sitting - Level of Assistance: 6: Modified independent (Device/Increase time) Static Standing Balance Static Standing - Balance Support: Bilateral upper extremity supported Static Standing - Level of Assistance: 5: Stand by assistance Dynamic Standing Balance Dynamic Standing - Balance Support: Bilateral upper extremity supported Dynamic Standing - Level of Assistance: 5: Stand by assistance Extremity/Trunk Assessment RUE Assessment RUE Assessment: Within Functional Limits Active Range of Motion (AROM) Comments: WNL General Strength Comments: 4+/5 LUE Assessment LUE Assessment: Within Functional Limits Active Range of Motion (AROM) Comments: WNL General Strength Comments: 4+/5   HOXIE, [facility]  02/25/2020, 3:11 PM

## 2020-02-25 NOTE — Progress Notes (Signed)
Physical Therapy Session Note  Patient Details  Name: Jon Townsend MRN: [id number] Date of Birth: 05/27/1955  Today's Date: 02/25/2020 PT Individual Time: 0800-0910 PT Individual Time Calculation (min): 70 min   Short Term Goals: Week 1:  PT Short Term Goal 1 (Week 1): pt will perform all bed mobility with CGA PT Short Term Goal 2 (Week 1): pt will transfer sit<>stand with LRAD and min A PT Short Term Goal 3 (Week 1): pt will transfer bed<>chair with LRAD and min A  Skilled Therapeutic Interventions/Progress Updates:   Received pt sitting in WC, pt agreeable to therapy, and denied any pain during session. Session with emphasis on functional mobility/transfers, generalized strengthening, dynamic standing balance/coordination, ambulation, stair navigation, and improved activity tolerance. Pt extremely eager to get out of here ASAP asking if he could leave as early as Friday now. Will discuss further with OT and rest of treatment team. Pt performed WC mobility 171ft using BUE and supervision to therapy gym and navigated 1 6in curb with RW and CGA. Pt then navigated 12 steps with 2 rails and CGA ascending and descending with a step to pattern. Pt required multiple rest breaks throughout session due to fatigue. Pt transported to rehab apartment and transferred stand<>pivot WC<>bed with RW and supervision and performed bed mobility mod I with increased time on regular bed. Pt ambulated 171ft with RW and supervision through hallway with no buckling noted but pt with decreased cadence, flexed trunk, and decreased bilateral foot clearance. Pt transported to dayroom in [facility] total A and transferred WC<>Nustep with RW and supervision and performed BLE strengthening on Nustep at workload 3 for 5 minutes for a total of 359 steps for improved cardiovascular endurance with 1 rest break. MD present for morning rounds and therapist informed MD of pt's request to leave Friday. Therapist provided pt with HEP consisting  of the following exercises and educated pt on frequency/duration/technique: -Mini Squat with Counter Support - 1 x daily - 7 x weekly - 3 sets - 10 reps -Standing March with Counter Support - 1 x daily - 7 x weekly - 3 sets - 12 reps -Standing Hip Abduction with Counter Support - 1 x daily - 7 x weekly - 3 sets - 10 reps -Standing Knee Flexion - 1 x daily - 7 x weekly - 3 sets - 10 reps -Sit to Stand with Arms Crossed - 1 x daily - 7 x weekly - 3 sets - 10 reps Pt performed the following exercises with close supervision and verbal cues for technique: -standing marches x12 bilaterally  -seated LAQ x12 bilaterally -seated hip adduction ball squeezes 2x10 with 3 second isometric hold. Pt reported felling fatigued at end of session and stated his legs "felt like jelly", however pt motivated to perform well with shower with OT this morning. Pt transported back to room in [facility] total A. Concluded session with pt sitting in WC, needs within reach, and chair pad alarm on.   Therapy Documentation Precautions:  Precautions Precautions: Fall Precaution Comments: off COVID precautions as of 02/13/20 Restrictions Weight Bearing Restrictions: No  Therapy/Group: Individual Therapy Alfonse Alpers PT, DPT   02/25/2020, 7:19 AM

## 2020-02-25 NOTE — Progress Notes (Addendum)
[place] PHYSICAL MEDICINE & REHABILITATION PROGRESS NOTE  Subjective/Complaints: Patient seen sitting up, working with therapy this morning.  He states he is doing well.  Discussed improvement with therapies.  Patient notes he is having regular bowel movements.  ROS: Denies CP, SOB, N/V/D  Objective: Vital Signs: Blood pressure 129/74, pulse 78, temperature 97.7 F (36.5 C), resp. rate 18, height 6\' 1"  (1.854 m), weight 108.7 kg, SpO2 97 %. No results found. Recent Labs    02/23/20 0516  WBC 6.0  HGB 12.8*  HCT 36.9*  PLT 173   Recent Labs    02/23/20 0516  NA 137  K 3.5  CL 100  CO2 29  GLUCOSE 172*  BUN 14  CREATININE 0.94  CALCIUM 8.2*    Intake/Output Summary (Last 24 hours) at 02/25/2020 1038 Last data filed at 02/25/2020 0700 Gross per 24 hour  Intake 960 ml  Output 1425 ml  Net -465 ml        Physical Exam: BP 129/74 (BP Location: Left Arm)   Pulse 78   Temp 97.7 F (36.5 C)   Resp 18   Ht 6\' 1"  (1.854 m)   Wt 108.7 kg   SpO2 97%   BMI 31.62 kg/m  Constitutional: No distress . Vital signs reviewed. HENT: Normocephalic.  Atraumatic. Eyes: EOMI. No discharge. Cardiovascular: No JVD.  RRR. Respiratory: Normal effort.  No stridor.  Bilateral clear to auscultation. GI: Non-distended.  BS +. Skin: Warm and dry.  Intact. Psych: Normal mood.  Normal behavior. Musc: No edema in extremities.  No tenderness in extremities. Neuro: Alert Motor: Bilateral upper extremities: 5/5 proximal distally  Bilateral lower extremities: Hip flexion, knee extension 4-4+/5, ankle dorsiflexion 4-4+/5, improving  Assessment/Plan: 1. Functional deficits which require 3+ hours per day of interdisciplinary therapy in a comprehensive inpatient rehab setting.  Physiatrist is providing close team supervision and 24 hour management of active medical problems listed below.  Physiatrist and rehab team continue to assess barriers to discharge/monitor patient progress toward  functional and medical goals   Care Tool:  Bathing    Body parts bathed by patient: Right arm,Left arm,Chest,Abdomen,Face,Front perineal area,Right upper leg,Left upper leg   Body parts bathed by helper: Buttocks,Right lower leg,Left lower leg     Bathing assist Assist Level: Moderate Assistance - Patient 50 - 74%     Upper Body Dressing/Undressing Upper body dressing   What is the patient wearing?: Pull over shirt    Upper body assist Assist Level: Supervision/Verbal cueing    Lower Body Dressing/Undressing Lower body dressing      What is the patient wearing?: Pants,Underwear/pull up     Lower body assist Assist for lower body dressing: Moderate Assistance - Patient 50 - 74%     Toileting Toileting    Toileting assist Assist for toileting: Maximal Assistance - Patient 25 - 49%     Transfers Chair/bed transfer  Transfers assist     Chair/bed transfer assist level: Contact Guard/Touching assist Chair/bed transfer assistive device: Programmer, multimedia   Ambulation assist   Ambulation activity did not occur: Safety/medical concerns (fatigue, generalized weakness, poor endurance)  Assist level: Supervision/Verbal cueing Assistive device: Walker-rolling Max distance: 133ft   Walk 10 feet activity   Assist  Walk 10 feet activity did not occur: Safety/medical concerns (fatigue, generalized weakness, poor endurance)  Assist level: Supervision/Verbal cueing Assistive device: Walker-rolling   Walk 50 feet activity   Assist Walk 50 feet with 2 turns activity did not  occur: Safety/medical concerns (fatigue, generalized weakness, poor endurance)  Assist level: Supervision/Verbal cueing Assistive device: Walker-rolling    Walk 150 feet activity   Assist Walk 150 feet activity did not occur: Safety/medical concerns (fatigue, generalized weakness, poor endurance)  Assist level: Supervision/Verbal cueing Assistive device: Walker-rolling     Walk 10 feet on uneven surface  activity   Assist Walk 10 feet on uneven surfaces activity did not occur: Safety/medical concerns (fatigue, generalized weakness, poor endurance)   Assist level: Contact Guard/Touching assist Assistive device: Aeronautical engineer Will patient use wheelchair at discharge?: No Type of Wheelchair: Manual Wheelchair activity did not occur: Safety/medical concerns (fatigue, generalized weakness, poor endurance)  Wheelchair assist level: Supervision/Verbal cueing Max wheelchair distance: 127ft    Wheelchair 50 feet with 2 turns activity    Assist    Wheelchair 50 feet with 2 turns activity did not occur: Safety/medical concerns (fatigue, generalized weakness, poor endurance)   Assist Level: Supervision/Verbal cueing   Wheelchair 150 feet activity     Assist  Wheelchair 150 feet activity did not occur: Safety/medical concerns (fatigue, generalized weakness, poor endurance)   Assist Level: Supervision/Verbal cueing   Medical Problem List and Plan: 1.  Impaired strength, ADLs, function, mobility secondary to Guillain Barre syndrome from [place]- 2nd time having [place]  Team conference today to discuss current and goals and coordination of care, home and environmental barriers, and discharge planning with nursing, case manager, and therapies. Please see conference note from today as well.  2.  Antithrombotics: -DVT/anticoagulation:  Pharmaceutical: Lovenox  -antiplatelet therapy: N/A 3. Pain Management:Increased gabapentin to 300 mg TID due to nerve pain             Oxycodone prn (holding d/t #8) DC'd  Controlled on 2/16  Monitor with increased exertion 4. Mood: LCSW to follow for evaluation and support.              -antipsychotic agents: N/A 5. Neuropsych: This patient is capable of making decisions on her own behalf. 6. Skin/Wound Care: Routine pressure relief measures. 7.  Fluids/Electrolytes/Nutrition: Monitor I/Os.  8.  Ileus: Resolved  Appreciate GI Recs 9. HTN: Monitor BP tid--continue Norvasc and lopressor  Controlled on 2/16 10. H/o BPH/Neurogenic bladder:   Increased Flomax to 0.8 mg daily  DCed Foley-PVRs unremarkable 11. T2DM with hyperglycemia: Hemoglobin A1c 7.7 on 1/25.  Monitor BS ac/hs  Diet change to carb modified on 2/14  Metformin XR 1000 daily started on 2/16 (home medication)  Elevated on 2/16  Monitor with increased ambulation 12. Hyponatremia: Resolved  Sodium 137 on 2/14 13. Bloody stools:   Hemoccult positive  Hemoglobin 12.8 on 2/14, labs ordered for tomorrow 14. Anorexia/ Low protein stores: Added prostat and offer Ensure Max if tolerated. 15. Eye infection: Symptoms improving but still erythematous              Continue allegra and polytrim eye drops   LOS: 6 days A FACE TO FACE EVALUATION WAS PERFORMED  Ankit Lorie Phenix 02/25/2020, 10:38 AM

## 2020-02-26 DIAGNOSIS — D62 Acute posthemorrhagic anemia: Secondary | ICD-10-CM

## 2020-02-26 LAB — CBC WITH DIFFERENTIAL/PLATELET
Abs Immature Granulocytes: 0.1 10*3/uL — ABNORMAL HIGH (ref 0.00–0.07)
Basophils Absolute: 0.1 10*3/uL (ref 0.0–0.1)
Basophils Relative: 1 %
Eosinophils Absolute: 0.5 10*3/uL (ref 0.0–0.5)
Eosinophils Relative: 8 %
HCT: 36.4 % — ABNORMAL LOW (ref 39.0–52.0)
Hemoglobin: 12.5 g/dL — ABNORMAL LOW (ref 13.0–17.0)
Immature Granulocytes: 2 %
Lymphocytes Relative: 22 %
Lymphs Abs: 1.5 10*3/uL (ref 0.7–4.0)
MCH: 31.3 pg (ref 26.0–34.0)
MCHC: 34.3 g/dL (ref 30.0–36.0)
MCV: 91 fL (ref 80.0–100.0)
Monocytes Absolute: 0.4 10*3/uL (ref 0.1–1.0)
Monocytes Relative: 7 %
Neutro Abs: 4.1 10*3/uL (ref 1.7–7.7)
Neutrophils Relative %: 60 %
Platelets: 191 10*3/uL (ref 150–400)
RBC: 4 MIL/uL — ABNORMAL LOW (ref 4.22–5.81)
RDW: 12.7 % (ref 11.5–15.5)
WBC: 6.7 10*3/uL (ref 4.0–10.5)
nRBC: 0 % (ref 0.0–0.2)

## 2020-02-26 LAB — GLUCOSE, CAPILLARY
Glucose-Capillary: 136 mg/dL — ABNORMAL HIGH (ref 70–99)
Glucose-Capillary: 94 mg/dL (ref 70–99)
Glucose-Capillary: 145 mg/dL — ABNORMAL HIGH (ref 70–99)
Glucose-Capillary: 121 mg/dL — ABNORMAL HIGH (ref 70–99)

## 2020-02-26 NOTE — Progress Notes (Signed)
[place] PHYSICAL MEDICINE & REHABILITATION PROGRESS NOTE  Subjective/Complaints: Patient seen sitting up in his chair this morning.  He states he slept fairly well overnight.  He has questions regarding recovery.  ROS: Denies CP, SOB, N/V/D  Objective: Vital Signs: Blood pressure 126/70, pulse 72, temperature 97.8 F (36.6 C), temperature source Oral, resp. rate 20, height 6\' 1"  (1.854 m), weight 108.7 kg, SpO2 97 %. No results found. Recent Labs    02/26/20 0459  WBC 6.7  HGB 12.5*  HCT 36.4*  PLT 191   No results for input(s): NA, K, CL, CO2, GLUCOSE, BUN, CREATININE, CALCIUM in the last 72 hours.  Intake/Output Summary (Last 24 hours) at 02/26/2020 1251 Last data filed at 02/26/2020 0949 Gross per 24 hour  Intake 420 ml  Output 1970 ml  Net -1550 ml        Physical Exam: BP 126/70 (BP Location: Left Arm)   Pulse 72   Temp 97.8 F (36.6 C) (Oral)   Resp 20   Ht 6\' 1"  (1.854 m)   Wt 108.7 kg   SpO2 97%   BMI 31.62 kg/m  Constitutional: No distress . Vital signs reviewed. HENT: Normocephalic.  Atraumatic. Eyes: EOMI. No discharge. Cardiovascular: No JVD.  RRR. Respiratory: Normal effort.  No stridor.  Bilateral clear to auscultation. GI: Non-distended.  BS +. Skin: Warm and dry.  Intact. Psych: Normal mood.  Normal behavior. Musc: No edema in extremities.  No tenderness in extremities. Neuro: Alert Motor: Bilateral upper extremities: 5/5 proximal distally  Bilateral lower extremities: Hip flexion, knee extension 4-4+/5, ankle dorsiflexion 4-4+/5, stable  Assessment/Plan: 1. Functional deficits which require 3+ hours per day of interdisciplinary therapy in a comprehensive inpatient rehab setting.  Physiatrist is providing close team supervision and 24 hour management of active medical problems listed below.  Physiatrist and rehab team continue to assess barriers to discharge/monitor patient progress toward functional and medical goals   Care  Tool:  Bathing    Body parts bathed by patient: Right arm,Left arm,Chest,Abdomen,Face,Front perineal area,Right upper leg,Left upper leg,Right lower leg,Left lower leg,Buttocks   Body parts bathed by helper: Buttocks,Right lower leg,Left lower leg     Bathing assist Assist Level: Supervision/Verbal cueing     Upper Body Dressing/Undressing Upper body dressing   What is the patient wearing?: Pull over shirt    Upper body assist Assist Level: Supervision/Verbal cueing    Lower Body Dressing/Undressing Lower body dressing      What is the patient wearing?: Pants,Underwear/pull up     Lower body assist Assist for lower body dressing: Minimal Assistance - Patient > 75%     Toileting Toileting    Toileting assist Assist for toileting: Maximal Assistance - Patient 25 - 49%     Transfers Chair/bed transfer  Transfers assist     Chair/bed transfer assist level: Supervision/Verbal cueing Chair/bed transfer assistive device: Armrests,Walker   Locomotion Ambulation   Ambulation assist   Ambulation activity did not occur: Safety/medical concerns (fatigue, generalized weakness, poor endurance)  Assist level: Supervision/Verbal cueing Assistive device: Walker-rolling Max distance: 175ft   Walk 10 feet activity   Assist  Walk 10 feet activity did not occur: Safety/medical concerns (fatigue, generalized weakness, poor endurance)  Assist level: Supervision/Verbal cueing Assistive device: Walker-rolling   Walk 50 feet activity   Assist Walk 50 feet with 2 turns activity did not occur: Safety/medical concerns (fatigue, generalized weakness, poor endurance)  Assist level: Supervision/Verbal cueing Assistive device: Walker-rolling    Walk 150 feet  activity   Assist Walk 150 feet activity did not occur: Safety/medical concerns (fatigue, generalized weakness, poor endurance)  Assist level: Supervision/Verbal cueing Assistive device: Walker-rolling    Walk 10  feet on uneven surface  activity   Assist Walk 10 feet on uneven surfaces activity did not occur: Safety/medical concerns (fatigue, generalized weakness, poor endurance)   Assist level: Contact Guard/Touching assist Assistive device: Aeronautical engineer Will patient use wheelchair at discharge?: No Type of Wheelchair: Manual Wheelchair activity did not occur: Safety/medical concerns (fatigue, generalized weakness, poor endurance)  Wheelchair assist level: Supervision/Verbal cueing Max wheelchair distance: 135ft    Wheelchair 50 feet with 2 turns activity    Assist    Wheelchair 50 feet with 2 turns activity did not occur: Safety/medical concerns (fatigue, generalized weakness, poor endurance)   Assist Level: Supervision/Verbal cueing   Wheelchair 150 feet activity     Assist  Wheelchair 150 feet activity did not occur: Safety/medical concerns (fatigue, generalized weakness, poor endurance)   Assist Level: Supervision/Verbal cueing   Medical Problem List and Plan: 1.  Impaired strength, ADLs, function, mobility secondary to Guillain Barre syndrome from [place]- 2nd time having COVID  Continue CIR 2.  Antithrombotics: -DVT/anticoagulation:  Pharmaceutical: Lovenox  -antiplatelet therapy: N/A 3. Pain Management:Increased gabapentin to 300 mg TID due to nerve pain             Oxycodone prn (holding d/t #8) DC'd  Controlled on 2/17  Monitor with increased exertion 4. Mood: LCSW to follow for evaluation and support.              -antipsychotic agents: N/A 5. Neuropsych: This patient is capable of making decisions on her own behalf. 6. Skin/Wound Care: Routine pressure relief measures. 7. Fluids/Electrolytes/Nutrition: Monitor I/Os.  8.  Ileus: Resolved  Appreciate GI Recs 9. HTN: Monitor BP tid--continue Norvasc and lopressor  Controlled on 2/17 10. H/o BPH/Neurogenic bladder:   Increased Flomax to 0.8 mg daily  DCed Foley-PVRs  unremarkable 11. T2DM with hyperglycemia: Hemoglobin A1c 7.7 on 1/25.  Monitor BS ac/hs  Diet change to carb modified on 2/14  Metformin XR 1000 daily started on 2/16 (home medication)  ?  Trending down on 2/17  Monitor with increased ambulation 12. Hyponatremia: Resolved  Sodium 137 on 2/14 13. Bloody stools/acute blood loss anemia:   Hemoccult positive  Hemoglobin 12.5 on 2/17 14. Anorexia/ Low protein stores: Added prostat and offer Ensure Max if tolerated. 15. Eye infection: Symptoms improving but still erythematous              Continue allegra and polytrim eye drops   LOS: 7 days A FACE TO FACE EVALUATION WAS PERFORMED  Jennice Renegar Lorie Phenix 02/26/2020, 12:51 PM

## 2020-02-26 NOTE — Progress Notes (Signed)
Physical Therapy Discharge Summary  Patient Details  Name: Jon Townsend MRN: [id number] Date of Birth: 05/07/1955  Patient has met 8 of 8 long term goals due to improved activity tolerance, improved balance, improved postural control, increased strength, improved attention, improved awareness and improved coordination. Patient to discharge at an ambulatory level Supervision. Pt's wife did not attend family education training as pt's discharge date was moved up. However, pt verbalized and demonstrated confidence with all tasks to ensure safe discharge home and is aware that he will need someone with him during stair navigation for safety purposes. Pt with no further questions regarding mobility.   All goals met  Recommendation:  Patient will benefit from ongoing skilled PT services in home health setting to continue to advance safe functional mobility, address ongoing impairments in transfers, generalized strengthening, dynamic standing balance/coordination, ambulation, endurance, and to minimize fall risk.  Equipment: No equipment provided; pt has RW  Reasons for discharge: treatment goals met and discharge from hospital  Patient/family agrees with progress made and goals achieved: Yes  PT Discharge Precautions/Restrictions Precautions Precautions: Fall Restrictions Weight Bearing Restrictions: No Cognition Overall Cognitive Status: Within Functional Limits for tasks assessed Arousal/Alertness: Awake/alert Orientation Level: Oriented X4 Memory: Appears intact Awareness: Appears intact Problem Solving: Appears intact Safety/Judgment: Appears intact Sensation Sensation Light Touch: Appears Intact Proprioception: Appears Intact Additional Comments: pt reports numbness, tingling, and coldness in bilateral hands and feet. Coordination Gross Motor Movements are Fluid and Coordinated: No Fine Motor Movements are Fluid and Coordinated: No Coordination and Movement Description:  mild uncoordination due to generalized weakness, poor endurance, and decreased balance/postural control Finger Nose Finger Test: [facility] bilaterally Heel Shin Test: WFL bilaterally Motor  Motor Motor: Abnormal postural alignment and control Motor - Skilled Clinical Observations: mild uncoordination due to generalized weakness, poor endurance, and decreased balance/postural control; improved since eval  Mobility Bed Mobility Bed Mobility: Rolling Right;Rolling Left;Sit to Supine;Supine to Sit Rolling Right: Independent with assistive device Rolling Left: Independent with assistive device Supine to Sit: Independent with assistive device Sit to Supine: Independent with assistive device Transfers Transfers: Sit to Stand;Stand to Sit;Stand Pivot Transfers Sit to Stand: Supervision/Verbal cueing Stand to Sit: Supervision/Verbal cueing Stand Pivot Transfers: Supervision/Verbal cueing Stand Pivot Transfer Details: Verbal cues for precautions/safety;Verbal cues for safe use of DME/AE Stand Pivot Transfer Details (indicate cue type and reason): verbal cues for safety when turning and RW management Transfer (Assistive device): Rolling walker Locomotion  Gait Ambulation: Yes Gait Assistance: Supervision/Verbal cueing Gait Distance (Feet): 150 Feet Assistive device: Rolling walker Gait Assistance Details: Verbal cues for precautions/safety Gait Assistance Details: verbal cues for energy conservation Gait Gait: Yes Gait Pattern: Impaired Gait Pattern: Step-through pattern;Decreased step length - right;Decreased step length - left;Trunk flexed;Poor foot clearance - right;Poor foot clearance - left;Narrow base of support;Decreased trunk rotation;Decreased stride length Gait velocity: decreased Stairs / Additional Locomotion Stairs: Yes Stairs Assistance: Contact Guard/Touching assist Stair Management Technique: Two rails Number of Stairs: 12 Height of Stairs: 6 Ramp: Contact Guard/touching  assist (RW) Curb: Contact Guard/Touching assist (RW) Product manager Mobility: Yes Wheelchair Assistance: Independent with Camera operator: Both upper extremities Wheelchair Parts Management: Supervision/cueing Distance: 150ft  Trunk/Postural Assessment  Cervical Assessment Cervical Assessment: Within Functional Limits Thoracic Assessment Thoracic Assessment: Exceptions to [facility] (mild kyphosis) Lumbar Assessment Lumbar Assessment: Exceptions to [facility] (posterior pelvic tilt in sitting) Postural Control Postural Control: Deficits on evaluation  Balance Balance Balance Assessed: Yes Static Sitting Balance Static Sitting - Balance Support: Feet supported;No upper extremity  supported Static Sitting - Level of Assistance: 7: Independent Dynamic Sitting Balance Dynamic Sitting - Balance Support: Feet supported;No upper extremity supported Dynamic Sitting - Level of Assistance: 6: Modified independent (Device/Increase time) Static Standing Balance Static Standing - Balance Support: Bilateral upper extremity supported (RW) Static Standing - Level of Assistance: 5: Stand by assistance (supervision) Dynamic Standing Balance Dynamic Standing - Balance Support: Bilateral upper extremity supported (RW) Dynamic Standing - Level of Assistance: 5: Stand by assistance (supervision) Extremity Assessment  RLE Assessment RLE Assessment: Exceptions to [facility] Strength Comments: grossly generalized to 4/5 LLE Assessment LLE Assessment: Exceptions to [facility] General Strength Comments: grossly generalized to 4/5  Alfonse Alpers PT, DPT  02/26/2020, 7:35 AM

## 2020-02-26 NOTE — Progress Notes (Signed)
Physical Therapy Session Note  Patient Details  Name: Jon Townsend MRN: [id number] Date of Birth: Apr 15, 1955  Today's Date: 02/26/2020 PT Individual Time: 1000-1055 and 1445-1515 PT Individual Time Calculation (min): 55 min and 30 min PT Missed Time: 15 minutes PT Missed Time Reason: Fatigue  Short Term Goals: Week 1:  PT Short Term Goal 1 (Week 1): pt will perform all bed mobility with CGA PT Short Term Goal 2 (Week 1): pt will transfer sit<>stand with LRAD and min A PT Short Term Goal 3 (Week 1): pt will transfer bed<>chair with LRAD and min A  Skilled Therapeutic Interventions/Progress Updates:   Treatment Session 1: 1000-1055 55 min Received pt sitting in WC, pt agreeable to therapy, and denied any pain during session but reported feeling fatigued from not sleeping well last night and requested to "take it easy". Session with emphasis on discharge planning, functional mobility/transfers, generalized strengthening, dynamic standing balance/coordination, ambulation, and improved activity tolerance. Pt transported to dayroom in [facility] total A and transferred WC<>mat with RW and supervision and stood with RW and picked up small cup with RW and CGA. Worked on dynamic standing balance constructing pictures on pegboard x 2 trials without UE support and close supervision. Pt performed the following exercises sitting on mat with supervision and verbal cues for technique: -overhead shoulder press<>horizontal chest press with 6lb dowel 2x12 bilaterally  -hip flexion with 3lb ankle weights 2x10 bilaterally -LAQ with 3lb ankle weights 2x10 bilaterally  -hip abduction with grn TB 2x12 -hamstring curls with grn TB 2x10 bilaterally  Pt with questions regarding Guillain Barrre diagnosis and expectations in terms of recovery timeline. Pt then ambulated 75ft with RW and supervision to practice slow turns; no LOB or knee buckling noted. Pt transported back to room in [facility] total A. Concluded session with pt  sitting in WC, needs within reach, and chair pad alarm on.   Treatment Session 2: 1445-1515 30 min Received pt sitting in WC, pt agreeable to therapy, and denied any pain during session but reported extreme fatigue. Session with emphasis on discharge planning, functional mobility/transfers, generalized strengthening, dynamic standing balance/coordination, ambulation, and improved activity tolerance. Pt requested to practice curb navigation and ascended and descended 1 curb x 2 trials with RW and CGA/close supervision. Pt verbalized and demonstrated confidence with task and transported to dayroom in [facility] total A. Pt performed seated BLE strengthening on Kinetron at 20 cm/sec for 1 minute and 30 cm/sec for 1 minute with BUE support and supervision with emphasis on glute/quad strengthening. Pt reported 10/10 fatigue after activity and requested to return to room but agreeable to sit up in recliner. Pt transported back to room in [facility] total A and ambulated 8ft with RW and close supervision to recliner. Concluded session with pt sitting in recliner, needs within reach, and chair pad alarm on. Therapist provided fresh ice water for pt. 15 minutes missed of skilled physical therapy due to fatigue.   Therapy Documentation Precautions:  Precautions Precautions: Fall Precaution Comments: off COVID precautions as of 02/13/20 Restrictions Weight Bearing Restrictions: No  Therapy/Group: Individual Therapy Alfonse Alpers PT, DPT   02/26/2020, 7:21 AM

## 2020-02-26 NOTE — Progress Notes (Signed)
Inpatient Rehabilitation Care Coordinator Discharge Note  The overall goal for the admission was met for:   Discharge location: Yes-HOME WITH WIFE WHO CAN PROVIDE SUPERVISION LEVEL DUE TO STILL RECOVERING FROM HER OWN FEMUR FRACTURE IN 10/2019  Length of Stay: Yes-8 DAYS  Discharge activity level: Yes-SUPERVISION LEVEL  Home/community participation: Yes  Services provided included: MD, RD, PT, OT, RN, CM, Pharmacy, Neuropsych and SW  Financial Services: Private Insurance: [place] offered to/list presented to:YES  Follow-up services arranged: Home Health: [facility], DME: NO NEEDS HAS FROM WIFE and Patient/Family request agency HH: PREF [facility] WHO WIFE HAD, DME: NO PREF  Comments (or additional information):WIFE WAS HERE FOR EDUCATION AND IT WENT WELL. PT WANTS TO GO HOME ASAP. WIFE CAN ASSIST WITH SUPERVISION  Patient/Family verbalized understanding of follow-up arrangements: Yes  Individual responsible for coordination of the follow-up plan: TERESA-WIFE [phone]  Confirmed correct DME delivered: Elease Hashimoto 02/26/2020    Jon Townsend, Gardiner Rhyme

## 2020-02-26 NOTE — Progress Notes (Signed)
Occupational Therapy Session Note  Patient Details  Name: Jon Townsend MRN: [id number] Date of Birth: 1955/12/20  Today's Date: 02/26/2020 OT Individual Time: 1110-1140 OT Individual Time Calculation (min): 30 min    Short Term Goals: Week 1:  OT Short Term Goal 1 (Week 1): Pt will complete sit > stand mod assist of one caregiver in preparation for LB dressing OT Short Term Goal 2 (Week 1): Pt will complete LB dressing at sit > stand level with mod assist OT Short Term Goal 3 (Week 1): Pt will complete toilet transfer with mod assist of one caregiver OT Short Term Goal 4 (Week 1): Pt will complete toileting with max assist of one caregiver  Skilled Therapeutic Interventions/Progress Updates:    Pt greeted seated in wc and agreeable to OT treatment session. UB there-ex with 5 mins x2 on [employer] arm bike. Pt took extended rest break in between sets. Discussed dc plan with patient regarding self-care task and safety within BADLs. Pt reports feeling ready for dc and is very pleased with his progress. Pt left seated in wc at end of session with needs met.   Therapy Documentation Precautions:  Precautions Precautions: Fall Precaution Comments: off COVID precautions as of 02/13/20 Restrictions Weight Bearing Restrictions: No Pain: Pain Assessment Pain Scale: 0-10 Pain Score: 0-No pain   Therapy/Group: Individual Therapy  Valma Cava 02/26/2020, 11:41 AM

## 2020-02-26 NOTE — Plan of Care (Signed)
  Problem: Consults Goal: RH GENERAL PATIENT EDUCATION Description: See Patient Education module for education specifics. Outcome: Progressing   Problem: RH BOWEL ELIMINATION Goal: RH STG MANAGE BOWEL WITH ASSISTANCE Description: STG Manage Bowel with Assistance. Mod Outcome: Progressing   Problem: RH BLADDER ELIMINATION Goal: RH STG MANAGE BLADDER WITH EQUIPMENT WITH ASSISTANCE Description: STG Manage Bladder With Equipment With Assistance. Mod Outcome: Progressing   Problem: RH SKIN INTEGRITY Goal: RH STG MAINTAIN SKIN INTEGRITY WITH ASSISTANCE Description: STG Maintain Skin Integrity With Assistance. Mod Outcome: Progressing   Problem: RH PAIN MANAGEMENT Goal: RH STG PAIN MANAGED AT OR BELOW PT'S PAIN GOAL Description: Less than 5 Outcome: Progressing   Problem: RH KNOWLEDGE DEFICIT GENERAL Goal: RH STG INCREASE KNOWLEDGE OF SELF CARE AFTER HOSPITALIZATION Description: Patient/family will be able to verbalize self-care following rehab with cues/handouts Outcome: Progressing

## 2020-02-26 NOTE — Progress Notes (Signed)
Occupational Therapy Session Note  Patient Details  Name: Jon Townsend MRN: [id number] Date of Birth: 1955/01/12  Today's Date: 02/26/2020 OT Individual Time: 1300-1345 OT Individual Time Calculation (min): 45 min    Short Term Goals: Week 1:  OT Short Term Goal 1 (Week 1): Pt will complete sit > stand mod assist of one caregiver in preparation for LB dressing OT Short Term Goal 2 (Week 1): Pt will complete LB dressing at sit > stand level with mod assist OT Short Term Goal 3 (Week 1): Pt will complete toilet transfer with mod assist of one caregiver OT Short Term Goal 4 (Week 1): Pt will complete toileting with max assist of one caregiver   Skilled Therapeutic Interventions/Progress Updates:   Pt sitting up in w/c, no c/o pain, reports he feels tired due to bad night's sleep and strong desire to get home.  Pt agreeable to further training for tub bench transfer however, he reports he doesn't need to practice anymore ADLs at this time.  Pt transported to ADL bathroom via w/c for energy conservation.  Pt completed short distance ambulation from doorway of bathroom to tub and completed tub bench transfer into/out of tub shower with supervision.  Pt transported back to room and discussed energy conservation strategies including planning, prioritizing, positioning.  Provided educational handout to pt to facilitate carryover in home setting.  Pt reports good understanding of all strategies and able to apply to ADL context.  Pt requesting to rest remainder of session due to fatigue.  Call bell in reach and needs in reach.   Therapy Documentation Precautions:  Precautions Precautions: Fall Precaution Comments: off COVID precautions as of 02/13/20 Restrictions Weight Bearing Restrictions: No   Therapy/Group: Individual Therapy  Ezekiel Slocumb 02/26/2020, 3:56 PM

## 2020-02-27 LAB — GLUCOSE, CAPILLARY: Glucose-Capillary: 144 mg/dL — ABNORMAL HIGH (ref 70–99)

## 2020-02-27 MED ORDER — POLYETHYLENE GLYCOL 3350 17 G PO PACK: 17.0000 g | PACK | Freq: Two times a day (BID) | ORAL | 0 refills | Status: DC

## 2020-02-27 MED ORDER — GABAPENTIN 300 MG PO CAPS: 300.0000 mg | ORAL_CAPSULE | Freq: Three times a day (TID) | ORAL | 0 refills | Status: DC

## 2020-02-27 MED ORDER — TAMSULOSIN HCL 0.4 MG PO CAPS: 0.8000 mg | ORAL_CAPSULE | Freq: Every day | ORAL | 0 refills | Status: DC

## 2020-02-27 NOTE — Progress Notes (Signed)
[place] PHYSICAL MEDICINE & REHABILITATION PROGRESS NOTE  Subjective/Complaints: Patient seen sitting up in his chair this AM.  He states he slept well overnight.  He is appreciative over his care and eager for discharge.   ROS: Denies CP, SOB, N/V/D  Objective: Vital Signs: Blood pressure 127/73, pulse 79, temperature 98.5 F (36.9 C), resp. rate 18, height 6\' 1"  (1.854 m), weight 108.7 kg, SpO2 99 %. No results found. Recent Labs    02/26/20 0459  WBC 6.7  HGB 12.5*  HCT 36.4*  PLT 191   No results for input(s): NA, K, CL, CO2, GLUCOSE, BUN, CREATININE, CALCIUM in the last 72 hours.  Intake/Output Summary (Last 24 hours) at 02/27/2020 0919 Last data filed at 02/27/2020 0400 Gross per 24 hour  Intake 330 ml  Output 3255 ml  Net -2925 ml        Physical Exam: BP 127/73 (BP Location: Left Arm)   Pulse 79   Temp 98.5 F (36.9 C)   Resp 18   Ht 6\' 1"  (1.854 m)   Wt 108.7 kg   SpO2 99%   BMI 31.62 kg/m  Constitutional: No distress . Vital signs reviewed. HENT: Normocephalic.  Atraumatic. Eyes: EOMI. No discharge. Cardiovascular: No JVD.  RRR. Respiratory: Normal effort.  No stridor.  Bilateral clear to auscultation. GI: Non-distended.  BS +. Skin: Warm and dry.  Intact. Psych: Normal mood.  Normal behavior. Musc: No edema in extremities.  No tenderness in extremities. Neuro: Alert Motor: Bilateral upper extremities: 5/5 proximal distally  Bilateral lower extremities: Hip flexion, knee extension 4-4+/5, ankle dorsiflexion 4-4+/5, improving gradually  Assessment/Plan: 1. Functional deficits which require 3+ hours per day of interdisciplinary therapy in a comprehensive inpatient rehab setting.  Physiatrist is providing close team supervision and 24 hour management of active medical problems listed below.  Physiatrist and rehab team continue to assess barriers to discharge/monitor patient progress toward functional and medical goals   Care Tool:  Bathing     Body parts bathed by patient: Right arm,Left arm,Chest,Abdomen,Face,Front perineal area,Right upper leg,Left upper leg,Right lower leg,Left lower leg,Buttocks   Body parts bathed by helper: Buttocks,Right lower leg,Left lower leg     Bathing assist Assist Level: Supervision/Verbal cueing     Upper Body Dressing/Undressing Upper body dressing   What is the patient wearing?: Pull over shirt    Upper body assist Assist Level: Supervision/Verbal cueing    Lower Body Dressing/Undressing Lower body dressing      What is the patient wearing?: Pants,Underwear/pull up     Lower body assist Assist for lower body dressing: Minimal Assistance - Patient > 75%     Toileting Toileting    Toileting assist Assist for toileting: Maximal Assistance - Patient 25 - 49%     Transfers Chair/bed transfer  Transfers assist     Chair/bed transfer assist level: Supervision/Verbal cueing Chair/bed transfer assistive device: Armrests,Walker   Locomotion Ambulation   Ambulation assist   Ambulation activity did not occur: Safety/medical concerns (fatigue, generalized weakness, poor endurance)  Assist level: Supervision/Verbal cueing Assistive device: Walker-rolling Max distance: 179ft   Walk 10 feet activity   Assist  Walk 10 feet activity did not occur: Safety/medical concerns (fatigue, generalized weakness, poor endurance)  Assist level: Supervision/Verbal cueing Assistive device: Walker-rolling   Walk 50 feet activity   Assist Walk 50 feet with 2 turns activity did not occur: Safety/medical concerns (fatigue, generalized weakness, poor endurance)  Assist level: Supervision/Verbal cueing Assistive device: Walker-rolling    Walk  150 feet activity   Assist Walk 150 feet activity did not occur: Safety/medical concerns (fatigue, generalized weakness, poor endurance)  Assist level: Supervision/Verbal cueing Assistive device: Walker-rolling    Walk 10 feet on uneven  surface  activity   Assist Walk 10 feet on uneven surfaces activity did not occur: Safety/medical concerns (fatigue, generalized weakness, poor endurance)   Assist level: Contact Guard/Touching assist Assistive device: Aeronautical engineer Will patient use wheelchair at discharge?: No Type of Wheelchair: Manual Wheelchair activity did not occur: Safety/medical concerns (fatigue, generalized weakness, poor endurance)  Wheelchair assist level: Supervision/Verbal cueing Max wheelchair distance: 138ft    Wheelchair 50 feet with 2 turns activity    Assist    Wheelchair 50 feet with 2 turns activity did not occur: Safety/medical concerns (fatigue, generalized weakness, poor endurance)   Assist Level: Supervision/Verbal cueing   Wheelchair 150 feet activity     Assist  Wheelchair 150 feet activity did not occur: Safety/medical concerns (fatigue, generalized weakness, poor endurance)   Assist Level: Supervision/Verbal cueing   Medical Problem List and Plan: 1.  Impaired strength, ADLs, function, mobility secondary to Guillain Barre syndrome from COVID- 2nd time having [place] today  Will see patient for hospital follow-up in 1 month post-discharge 2.  Antithrombotics: -DVT/anticoagulation:  Pharmaceutical: Lovenox  -antiplatelet therapy: N/A 3. Pain Management:Increased gabapentin to 300 mg TID due to nerve pain             Oxycodone prn (holding d/t #8) DC'd  Controlled on 2/18  Monitor with increased exertion 4. Mood: LCSW to follow for evaluation and support.              -antipsychotic agents: N/A 5. Neuropsych: This patient is capable of making decisions on her own behalf. 6. Skin/Wound Care: Routine pressure relief measures. 7. Fluids/Electrolytes/Nutrition: Monitor I/Os.  8.  Ileus: Resolved  Appreciate GI Recs 9. HTN: Monitor BP tid--continue Norvasc and lopressor  Controlled on 2/18 10. H/o BPH/Neurogenic bladder:   Increased  Flomax to 0.8 mg daily  DCed Foley-PVRs unremarkable 11. T2DM with hyperglycemia: Hemoglobin A1c 7.7 on 1/25.  Monitor BS ac/hs  Diet change to carb modified on 2/14  Metformin XR 1000 daily started on 2/16 (home medication)  Slightly elevated on 2/18, continue to monitor in ambulatory setting with potential further adjustments as necessary  Monitor with increased ambulation 12. Hyponatremia: Resolved  Sodium 137 on 2/14 13. Bloody stools/acute blood loss anemia:   Hemoccult positive  Hemoglobin 12.5 on 2/17, stable 14. Anorexia/ Low protein stores: Added prostat and offer Ensure Max if tolerated. 15. Eye infection: Symptoms improving but still erythematous              Continue allegra and polytrim eye drops    > 30 minutes spent in total in discharge planning between myself and PA regarding aforementioned, as well discussion regarding DME equipment, follow-up appointments, follow-up therapies, discharge medications, discharge recommendations, answering questions.  Please see discharge summary as well.  LOS: 8 days A FACE TO FACE EVALUATION WAS PERFORMED  Abdel Effinger Lorie Phenix 02/27/2020, 9:19 AM

## 2020-02-27 NOTE — Plan of Care (Signed)
Patient discharged via wheel chair by staff with family by side. Patient voices understanding of discharge instructions

## 2020-02-27 NOTE — Discharge Summary (Addendum)
Physician Discharge Summary  Patient ID: Jon Townsend MRN: [id number] DOB/AGE: 07/24/1955 64 y.o.  Admit date: 02/19/2020 Discharge date: 02/27/2020  Discharge Diagnoses:  Principal Problem:   Guillain Barr syndrome [facility]) Active Problems:   BPH (benign prostatic hyperplasia)   Diabetes mellitus type 2 in obese (HCC)   Polyneuropathy   Bloody stool   Controlled type 2 diabetes mellitus with hyperglycemia, without long-term current use of insulin (HCC)   Acute blood loss anemia   Discharged Condition:  Stable   Significant Diagnostic Studies: DG Abd 1 View  Result Date: 02/19/2020 CLINICAL DATA:  Abdominal distension EXAM: ABDOMEN - 1 VIEW COMPARISON:  02/14/2020 FINDINGS: Numerous gas-filled nondilated loops of large and small bowel are seen throughout the abdomen most in keeping with an mild adynamic ileus. There is no gross free intraperitoneal gas. No organomegaly. Degenerative changes are seen within the lumbar spine. No abnormal calcifications within the abdomen. IMPRESSION: Mild ileus Electronically Signed   By: Fidela Salisbury MD   On: 02/19/2020 16:08   DG Abd 2 Views  Result Date: 02/14/2020 CLINICAL DATA:  Pain EXAM: ABDOMEN - 2 VIEW COMPARISON:  July 03, 2020 FINDINGS: Gaseous distention of bowel without dilation. Air is visualized in the rectum. No free air. Mild degenerative changes of the lower lumbar spine. Visualized lung bases are unremarkable. IMPRESSION: Gaseous distention of bowel without dilation. Findings could reflect ileus in the appropriate clinical setting. Electronically Signed   By: Valentino Saxon MD   On: 02/14/2020 20:12    Labs:  Basic Metabolic Panel: BMP Latest Ref Rng & Units 02/23/2020 02/20/2020 02/19/2020  Glucose 70 - 99 mg/dL 172(H) 177(H) 140(H)  BUN 8 - 23 mg/dL $Remove'14 17 14  'jALkRUQ$ Creatinine 0.61 - 1.24 mg/dL 0.94 0.78 0.83  Sodium 135 - 145 mmol/L 137 130(L) 133(L)  Potassium 3.5 - 5.1 mmol/L 3.5 3.9 4.0  Chloride 98 - 111 mmol/L 100 97(L) 96(L)   CO2 22 - 32 mmol/L $RemoveB'29 25 27  'QhMjRDtF$ Calcium 8.9 - 10.3 mg/dL 8.2(L) 8.0(L) 8.5(L)    CBC: CBC Latest Ref Rng & Units 02/26/2020 02/23/2020 02/20/2020  WBC 4.0 - 10.5 K/uL 6.7 6.0 6.7  Hemoglobin 13.0 - 17.0 g/dL 12.5(L) 12.8(L) 12.7(L)  Hematocrit 39.0 - 52.0 % 36.4(L) 36.9(L) 37.5(L)  Platelets 150 - 400 K/uL 191 173 166    CBG: Recent Labs  Lab 02/26/20 0600 02/26/20 1154 02/26/20 1700 02/26/20 2102 02/27/20 0553  GLUCAP 136* 94 145* 121* 144*    Brief HPI:   Jon Townsend is a 65 y.o. male with history of T2DM, HTN, BPH who presented to the ED on 02/03/2020 with 48-hour history of progressive weakness BLE greater than BUE and paresthesias, numbness bilateral hands and feet as well as inability to walk.  He was found to be Covid positive and reported having headaches, URI symptoms as well as anorexia.  Work-up revealed areflexia with tetraplegia concerning for GBS.  Attempts at LP unsuccessful and he was treated with IVIG x5 doses as neurology felt that patient with GBS in setting of Covid infection.  MRI spine done as part of work-up and showed degenerative spondylosis with foraminal stenosis C5/C6 multilevel degenerative spondylosis lumbar spine with moderate spinal stenosis L2 through/3 to L3/4 and incidental soft tissue edema posterior right greater than left paraspinal musculature most likely related to LP attempts.   Hospital course significant for issues with hyponatremia which was being managed by fluid restriction, urinary retention requiring Foley placement as well as issues with severe constipation.  KUB  done showing concerns of ileus followed by CT of abdomen/pelvis due to ongoing abdominal pain and blood in stool.  This revealed small bowel thickening with question of colitis as well as incidental findings of diffuse hepatic steatosis with hepatic cysts.  He continued to have diarrhea with worsening of hyponatremia and AKI treated with fluid boluses as well as drop in H&H and  transient issues with thrombocytopenia and leukocytosis.  His p.o. intake continues to be variable.  He continued to have weakness with sensory deficits affecting ADLs and mobility.  CIR was recommended due to functional decline.   Hospital Course: Jon Townsend was admitted to rehab 02/19/2020 for inpatient therapies to consist of PT and OT at least three hours five days a week. Past admission physiatrist, therapy team and rehab RN have worked together to provide customized collaborative inpatient rehab. He reported abdominal discomfort with poor appetite at admission and exam revealed hypoactive bowel sound with abdominal distension. KUB showed evidence of persistent ileus therefore he was started on IV reglan and diet changed to full liquids. He had heme positive stool next am with further drop in H/H therefore GI was consulted for input. Dr. Havery Moros and felt that ileus was mild and recommended dose of Relistor SQ with d/c of narcotics, increase in activity, trending H/H with follow up colonoscopy on outpatient basis.  His GI symptoms improved and he tolerated resumption of diet with normalization of bowel function on miralax bid.   Eye infection has resolved and polytrim was discontinued at discharge.  Flomax was increased to 0.8 mg at admission. Foley was d/c on 02/14 and he has been voiding without signs of retention. His narcotics were d/c per GI recommendations and pain has been controlled with use of tylenol prn and increase in gabapentin to 300 mg TID to manage neuropathy. His blood pressures were monitored on TID basis and have been relatively controlled. Follow up labs showed that hyponatremia has resolved and renal status is stable. His diabetes has been monitored with ac/hs CBG checks and SSI was use prn for tighter BS control. Metformin was resumed with improvement in BS.  Patient has been making good gains during his stay but requested that his LOS be cut short and is currently at  supervision level. He continues to be limited by generalized weakness with balance deficits/decreased coordination.   He will continue to receive follow up HHPT and [place] [facility] after discharge.    Rehab course: During patient's stay in rehab weekly team conferences were held to monitor patient's progress, set goals and discuss barriers to discharge. At admission, patient required mod assist +2 for ADLs and mod assist with mobility. He  has had improvement in activity tolerance, balance, postural control as well as ability to compensate for deficits. He is able to complete ADL tasks at supervision level.  He requires supervision for transfers and for ambulation with use of RW.  Family education was completed with wife regarding all aspects of care and safety.  Discharge disposition: 01-Home or Self Care  Diet: Carb Modified.   Special Instructions: 1. No driving or strenuous activity till cleared by MD.    Discharge Instructions     Ambulatory referral to Physical Medicine Rehab   Complete by: As directed    3-4 week follow up appointment      Allergies as of 02/27/2020   No Known Allergies      Medication List     STOP taking these medications  docusate sodium 100 MG capsule Commonly known as: COLACE   lidocaine 5 % Commonly known as: LIDODERM   ondansetron 4 MG disintegrating tablet Commonly known as: ZOFRAN-ODT   OneTouch Verio w/Device Kit   Oxycodone HCl 10 MG Tabs   simethicone 80 MG chewable tablet Commonly known as: MYLICON   trimethoprim-polymyxin b ophthalmic solution Commonly known as: POLYTRIM       TAKE these medications    amLODipine 5 MG tablet Commonly known as: NORVASC Take 1 tablet (5 mg total) by mouth daily.   atorvastatin 40 MG tablet Commonly known as: LIPITOR Take 1 tablet (40 mg total) by mouth daily. What changed: when to take this   CoQ10 100 MG Caps Take 100 mg by mouth in the morning.   fexofenadine 180 MG  tablet Commonly known as: ALLEGRA Take 180 mg by mouth in the morning.   gabapentin 300 MG capsule Commonly known as: NEURONTIN Take 1 capsule (300 mg total) by mouth 3 (three) times daily. What changed:  medication strength how much to take   Krill Oil 1000 MG Caps Take 1,000 mg by mouth daily.   latanoprost 0.005 % ophthalmic solution Commonly known as: XALATAN Place 1 drop into both eyes at bedtime.   magnesium oxide 400 (241.3 Mg) MG tablet Commonly known as: MAG-OX Take 1 tablet (400 mg total) by mouth daily.   Melatonin 10 MG Tabs Take 10 mg by mouth at bedtime as needed (sleep).   metFORMIN 500 MG 24 hr tablet Commonly known as: GLUCOPHAGE-XR Take 2 tablets (1,000 mg total) by mouth daily with breakfast.   metoprolol tartrate 50 MG tablet Commonly known as: LOPRESSOR Take 1 tablet (50 mg total) by mouth 2 (two) times daily. What changed: when to take this   onetouch ultrasoft lancets Use daily to check blood sugar.   polyethylene glycol 17 g packet Commonly known as: MIRALAX / GLYCOLAX Take 17 g by mouth 2 (two) times daily.   sodium chloride 0.65 % Soln nasal spray Commonly known as: OCEAN Place 1 spray into both nostrils as needed for congestion.   tamsulosin 0.4 MG Caps capsule Commonly known as: FLOMAX Take 2 capsules (0.8 mg total) by mouth daily after supper. What changed:  how much to take when to take this   vitamin C 500 MG tablet Commonly known as: ASCORBIC ACID Take 500-1,000 mg by mouth daily.        Follow-up Information     [name], DO Follow up.   Specialty: Family Medicine Contact information: [place] [address] [phone]         Jamse Arn, MD Follow up.   Specialty: Physical Medicine and Rehabilitation Why: office will call you with follow up appointment Contact information: [address] [place] [address] [phone]          Yetta Flock, MD. Call.   Specialty: Gastroenterology Why: as needed for GI issues. Contact information: [address] [place] [address] [phone]                 Signed: Bary Leriche 03/03/2020, 11:28 AM Patient was seen, face-face, and physical exam performed by me on day of discharge, greater than 30 minutes of total time spent.. Please see progress note from day of discharge as well.  Delice Lesch, MD, ABPMR

## 2020-02-27 NOTE — Discharge Instructions (Signed)
Inpatient Rehab Discharge Instructions  Kevontae Burgoon Discharge date and time:  02/27/20  Activities/Precautions/ Functional Status: Activity: no lifting, driving, or strenuous exercise till cleared by MD Diet: diabetic diet Wound Care: N/A   Functional status:  ___ No restrictions     ___ Walk up steps independently _X__ 24/7 supervision/assistance   ___ Walk up steps with assistance ___ Intermittent supervision/assistance  ___ Bathe/dress independently ___ Walk with walker     _X__ Bathe/dress with assistance ___ Walk Independently    ___ Shower independently _X__ Walk with supervision     ___ Shower with assistance _X__ No alcohol     ___ Return to work/school ________  Special Instructions:    COMMUNITY REFERRALS UPON DISCHARGE:    Home Health:   [place]                                                    My questions have been answered and I understand these instructions. I will adhere to these goals and the provided educational materials after my discharge from the hospital.  Patient/Caregiver Signature _______________________________ Date __________  Clinician Signature _______________________________________ Date __________  Please bring this form and your medication list with you to all your follow-up doctor's appointments.

## 2020-02-28 DIAGNOSIS — N401 Enlarged prostate with lower urinary tract symptoms: Secondary | ICD-10-CM | POA: Diagnosis not present

## 2020-02-28 DIAGNOSIS — K5900 Constipation, unspecified: Secondary | ICD-10-CM | POA: Diagnosis not present

## 2020-02-28 DIAGNOSIS — G610 Guillain-Barre syndrome: Secondary | ICD-10-CM | POA: Diagnosis not present

## 2020-02-28 DIAGNOSIS — M4316 Spondylolisthesis, lumbar region: Secondary | ICD-10-CM | POA: Diagnosis not present

## 2020-02-28 DIAGNOSIS — G4700 Insomnia, unspecified: Secondary | ICD-10-CM | POA: Diagnosis not present

## 2020-02-28 DIAGNOSIS — I10 Essential (primary) hypertension: Secondary | ICD-10-CM | POA: Diagnosis not present

## 2020-02-28 DIAGNOSIS — E1142 Type 2 diabetes mellitus with diabetic polyneuropathy: Secondary | ICD-10-CM | POA: Diagnosis not present

## 2020-02-28 DIAGNOSIS — E871 Hypo-osmolality and hyponatremia: Secondary | ICD-10-CM | POA: Diagnosis not present

## 2020-02-28 DIAGNOSIS — E785 Hyperlipidemia, unspecified: Secondary | ICD-10-CM | POA: Diagnosis not present

## 2020-02-28 DIAGNOSIS — R338 Other retention of urine: Secondary | ICD-10-CM | POA: Diagnosis not present

## 2020-03-01 ENCOUNTER — Telehealth: Payer: Self-pay

## 2020-03-01 MED ORDER — ONETOUCH VERIO VI STRP: ORAL_STRIP | 11 refills | Status: AC

## 2020-03-01 NOTE — Telephone Encounter (Signed)
Transition Care Management Follow-up Telephone Call  Date of discharge and from where: 02/27/20-[place]  How have you been since you were released from the hospital? Getting better everyday. Feeling god.  Any questions or concerns? No  Items Reviewed:  Did the pt receive and understand the discharge instructions provided? Yes   Medications obtained and verified? Yes   Other? Yes   Any new allergies since your discharge? No   Dietary orders reviewed? Yes  Do you have support at home? Yes   Home Care and Equipment/Supplies: Were home health services ordered? no If so, what is the name of the agency? n/a  Has the agency set up a time to come to the patient's home? not applicable Were any new equipment or medical supplies ordered?  No What is the name of the medical supply agency? n/a Were you able to get the supplies/equipment? not applicable Do you have any questions related to the use of the equipment or supplies? N/A  Functional Questionnaire: (I = Independent and D = Dependent) ADLs: I  Bathing/Dressing- I  Meal Prep- I  Eating- I  Maintaining continence- I  Transferring/Ambulation- I  Managing Meds- I  Follow up appointments reviewed:   PCP Hospital f/u appt confirmed? Yes  Scheduled to see Dr. Nani Ravens on 03/08/20 @ 11:00.  [facility] f/u appt confirmed? Yes  Scheduled to see Dr. Posey Pronto on 04/05/20 @ 1:40.  Are transportation arrangements needed? No   If their condition worsens, is the pt aware to call PCP or go to the Emergency Dept.? Yes  Was the patient provided with contact information for the PCP's office or ED? Yes  Was to pt encouraged to call back with questions or concerns? Yes

## 2020-03-02 DIAGNOSIS — K5900 Constipation, unspecified: Secondary | ICD-10-CM | POA: Diagnosis not present

## 2020-03-02 DIAGNOSIS — G610 Guillain-Barre syndrome: Secondary | ICD-10-CM | POA: Diagnosis not present

## 2020-03-02 DIAGNOSIS — R338 Other retention of urine: Secondary | ICD-10-CM | POA: Diagnosis not present

## 2020-03-02 DIAGNOSIS — M4316 Spondylolisthesis, lumbar region: Secondary | ICD-10-CM | POA: Diagnosis not present

## 2020-03-02 DIAGNOSIS — I10 Essential (primary) hypertension: Secondary | ICD-10-CM | POA: Diagnosis not present

## 2020-03-02 DIAGNOSIS — E785 Hyperlipidemia, unspecified: Secondary | ICD-10-CM | POA: Diagnosis not present

## 2020-03-02 DIAGNOSIS — E871 Hypo-osmolality and hyponatremia: Secondary | ICD-10-CM | POA: Diagnosis not present

## 2020-03-02 DIAGNOSIS — N401 Enlarged prostate with lower urinary tract symptoms: Secondary | ICD-10-CM | POA: Diagnosis not present

## 2020-03-02 DIAGNOSIS — G4700 Insomnia, unspecified: Secondary | ICD-10-CM | POA: Diagnosis not present

## 2020-03-02 DIAGNOSIS — E1142 Type 2 diabetes mellitus with diabetic polyneuropathy: Secondary | ICD-10-CM | POA: Diagnosis not present

## 2020-03-04 DIAGNOSIS — E785 Hyperlipidemia, unspecified: Secondary | ICD-10-CM | POA: Diagnosis not present

## 2020-03-04 DIAGNOSIS — R338 Other retention of urine: Secondary | ICD-10-CM | POA: Diagnosis not present

## 2020-03-04 DIAGNOSIS — E1142 Type 2 diabetes mellitus with diabetic polyneuropathy: Secondary | ICD-10-CM | POA: Diagnosis not present

## 2020-03-04 DIAGNOSIS — G610 Guillain-Barre syndrome: Secondary | ICD-10-CM | POA: Diagnosis not present

## 2020-03-04 DIAGNOSIS — N401 Enlarged prostate with lower urinary tract symptoms: Secondary | ICD-10-CM | POA: Diagnosis not present

## 2020-03-04 DIAGNOSIS — I10 Essential (primary) hypertension: Secondary | ICD-10-CM | POA: Diagnosis not present

## 2020-03-04 DIAGNOSIS — M4316 Spondylolisthesis, lumbar region: Secondary | ICD-10-CM | POA: Diagnosis not present

## 2020-03-04 DIAGNOSIS — E871 Hypo-osmolality and hyponatremia: Secondary | ICD-10-CM | POA: Diagnosis not present

## 2020-03-04 DIAGNOSIS — K5900 Constipation, unspecified: Secondary | ICD-10-CM | POA: Diagnosis not present

## 2020-03-04 DIAGNOSIS — G4700 Insomnia, unspecified: Secondary | ICD-10-CM | POA: Diagnosis not present

## 2020-03-08 ENCOUNTER — Other Ambulatory Visit: Payer: Self-pay | Admitting: Family Medicine

## 2020-03-08 ENCOUNTER — Encounter: Payer: Self-pay | Admitting: Family Medicine

## 2020-03-08 ENCOUNTER — Other Ambulatory Visit: Payer: Self-pay

## 2020-03-08 ENCOUNTER — Ambulatory Visit (INDEPENDENT_AMBULATORY_CARE_PROVIDER_SITE_OTHER): Payer: Medicare HMO | Admitting: Family Medicine

## 2020-03-08 VITALS — BP 118/82 | HR 70 | Temp 98.1°F | Ht 73.0 in | Wt 237.5 lb

## 2020-03-08 DIAGNOSIS — G610 Guillain-Barre syndrome: Secondary | ICD-10-CM

## 2020-03-08 DIAGNOSIS — Z136 Encounter for screening for cardiovascular disorders: Secondary | ICD-10-CM | POA: Diagnosis not present

## 2020-03-08 DIAGNOSIS — E1169 Type 2 diabetes mellitus with other specified complication: Secondary | ICD-10-CM | POA: Diagnosis not present

## 2020-03-08 DIAGNOSIS — E669 Obesity, unspecified: Secondary | ICD-10-CM | POA: Diagnosis not present

## 2020-03-08 DIAGNOSIS — E871 Hypo-osmolality and hyponatremia: Secondary | ICD-10-CM | POA: Diagnosis not present

## 2020-03-08 DIAGNOSIS — D649 Anemia, unspecified: Secondary | ICD-10-CM

## 2020-03-08 LAB — COMPREHENSIVE METABOLIC PANEL
ALT: 22 U/L (ref 0–53)
AST: 15 U/L (ref 0–37)
Albumin: 3.7 g/dL (ref 3.5–5.2)
Alkaline Phosphatase: 92 U/L (ref 39–117)
BUN: 16 mg/dL (ref 6–23)
CO2: 32 mEq/L (ref 19–32)
Calcium: 9.3 mg/dL (ref 8.4–10.5)
Chloride: 101 mEq/L (ref 96–112)
Creatinine, Ser: 0.79 mg/dL (ref 0.40–1.50)
GFR: 92.7 mL/min (ref >60.00)
Glucose, Bld: 86 mg/dL (ref 70–99)
Potassium: 5 mEq/L (ref 3.5–5.1)
Sodium: 139 mEq/L (ref 135–145)
Total Bilirubin: 0.7 mg/dL (ref 0.2–1.2)
Total Protein: 6.8 g/dL (ref 6.0–8.3)

## 2020-03-08 LAB — LIPID PANEL
Cholesterol: 112 mg/dL (ref 0–200)
HDL: 32.3 mg/dL — ABNORMAL LOW (ref >39.00)
NonHDL: 79.24
Total CHOL/HDL Ratio: 3
Triglycerides: 220 mg/dL — ABNORMAL HIGH (ref 0.0–149.0)
VLDL: 44 mg/dL — ABNORMAL HIGH (ref 0.0–40.0)

## 2020-03-08 LAB — LDL CHOLESTEROL, DIRECT: Direct LDL: 54 mg/dL

## 2020-03-08 LAB — CBC
HCT: 43 % (ref 39.0–52.0)
Hemoglobin: 14.3 g/dL (ref 13.0–17.0)
MCHC: 33.2 g/dL (ref 30.0–36.0)
MCV: 92.8 fl (ref 78.0–100.0)
Platelets: 244 10*3/uL (ref 150.0–400.0)
RBC: 4.63 Mil/uL (ref 4.22–5.81)
RDW: 14 % (ref 11.5–15.5)
WBC: 7.1 10*3/uL (ref 4.0–10.5)

## 2020-03-08 MED ORDER — METOPROLOL TARTRATE 50 MG PO TABS: 50.0000 mg | ORAL_TABLET | Freq: Two times a day (BID) | ORAL | 3 refills | Status: DC

## 2020-03-08 MED ORDER — AMLODIPINE BESYLATE 5 MG PO TABS: 5.0000 mg | ORAL_TABLET | Freq: Every day | ORAL | 3 refills | Status: DC

## 2020-03-08 NOTE — Progress Notes (Signed)
Chief Complaint  Patient presents with  . Hospitalization Follow-up    HPI Jon Townsend is a 65 y.o. y.o. male who presents for a transition of care visit.  He is here with his wife.  The patient was admitted to the hospital on 1/25 and discharged to inpatient rehab on 2/10.  He was discharged to rehab on 2/18.  He was having upper respiratory issues in January and later in the month then started having numbness and tingling of his upper extremities that progressed proximally and also started involving his lower extremities.  He was diagnosed with Guillain-Barr syndrome and given IVIG.  He was recommended for inpatient rehab and was transferred.  He steadily improved and requested earlier discharge.  He has been working with home occupational and physical therapy improving each day.  He has a follow-up appointment in late March with Dr. Posey Townsend of the physical medicine and rehabilitation team.  Of note, his hospital course was complicated with bloody stools and acute blood loss anemia in addition to hyponatremia.  Past Medical History:  Diagnosis Date  . Allergy   . BPH (benign prostatic hyperplasia)   . Diabetes mellitus type 2 in obese ([place])   . Glaucoma   . Hypertension    Past Surgical History:  Procedure Laterality Date  . Broken finger    . TONSILLECTOMY     Family History  Problem Relation Age of Onset  . Cancer Father   . Early death Father   . Diabetes Sister   . Heart disease Sister   . COPD Sister   . Diabetes Sister   . Colon cancer Neg Hx   . Esophageal cancer Neg Hx   . Stomach cancer Neg Hx   . Rectal cancer Neg Hx    Allergies as of 03/08/2020   No Known Allergies     Medication List       Accurate as of March 08, 2020 11:54 AM. If you have any questions, ask your nurse or doctor.        STOP taking these medications   sodium chloride 0.65 % Soln nasal spray Commonly known as: OCEAN Stopped by: Jon Pal, DO     TAKE these  medications   amLODipine 5 MG tablet Commonly known as: NORVASC Take 1 tablet (5 mg total) by mouth daily.   atorvastatin 40 MG tablet Commonly known as: LIPITOR Take 1 tablet (40 mg total) by mouth daily. What changed: when to take this   CoQ10 100 MG Caps Take 100 mg by mouth in the morning.   fexofenadine 180 MG tablet Commonly known as: ALLEGRA Take 180 mg by mouth in the morning.   gabapentin 300 MG capsule Commonly known as: NEURONTIN Take 1 capsule (300 mg total) by mouth 3 (three) times daily.   Krill Oil 1000 MG Caps Take 1,000 mg by mouth daily.   latanoprost 0.005 % ophthalmic solution Commonly known as: XALATAN Place 1 drop into both eyes at bedtime.   magnesium oxide 400 (241.3 Mg) MG tablet Commonly known as: MAG-OX Take 1 tablet (400 mg total) by mouth daily.   Melatonin 10 MG Tabs Take 10 mg by mouth at bedtime as needed (sleep).   metFORMIN 500 MG 24 hr tablet Commonly known as: GLUCOPHAGE-XR Take 2 tablets (1,000 mg total) by mouth daily with breakfast.   metoprolol tartrate 50 MG tablet Commonly known as: LOPRESSOR Take 1 tablet (50 mg total) by mouth 2 (two) times daily. What changed: when to  take this   onetouch ultrasoft lancets Use daily to check blood sugar.   OneTouch Verio test strip Generic drug: glucose blood Use as once daily to check blood sugar.   polyethylene glycol 17 g packet Commonly known as: MIRALAX / GLYCOLAX Take 17 g by mouth 2 (two) times daily.   tamsulosin 0.4 MG Caps capsule Commonly known as: FLOMAX Take 2 capsules (0.8 mg total) by mouth daily after supper.   vitamin C 500 MG tablet Commonly known as: ASCORBIC ACID Take 500-1,000 mg by mouth daily.       ROS:  Constitutional: No fevers or chills, no weight loss HEENT: No headaches, hearing loss, or runny nose, no sore throat Heart: No chest pain Lungs: No SOB, no cough Abd: No bowel changes, no pain, no N/V GU: No urinary complaints Neuro: No new  numbness, tingling or weakness Msk: No new joint or muscle pain  Objective BP 118/82 (BP Location: Left Arm, Patient Position: Sitting, Cuff Size: Normal)   Pulse 70   Temp 98.1 F (36.7 C) (Oral)   Ht 6\' 1"  (1.854 m)   Wt 237 lb 8 oz (107.7 kg)   SpO2 97%   BMI 31.33 kg/m  General Appearance:  awake, alert, oriented, in no acute distress and well developed, well nourished Skin:  there are no suspicious lesions or rashes of concern Head/face:  NCAT Eyes:  EOMI, PERRLA Neck:  neck- supple, no mass, non-tender and no jvd Lungs: Clear to auscultation.  No rales, rhonchi, or wheezing. Normal effort, no accessory muscle use. Heart:  Heart sounds are normal.  Regular rate and rhythm without murmur, gallop or rub. No bruits. Abdomen:  BS+, soft, NT, ND, no masses or organomegaly Musculoskeletal:  No muscle group atrophy or asymmetry, gait normal Neurologic:  Alert and oriented x 3, gait normal., reflexes normal and symmetric, strength and  sensation normal in all extremities bilaterally Psych exam: Nml mood and affect, age appropriate judgment and insight  Hyponatremia  Diabetes mellitus type 2 in obese ([place]) - Plan: Comprehensive metabolic panel, Lipid panel  Screening for AAA (abdominal aortic aneurysm) - Plan: US AORTA MEDICARE SCREENING  Anemia, unspecified type - Plan: CBC  GBS (Guillain Barre syndrome) ([place])  We will follow-up on labs given his hyponatremia and anemia in the hospital.  We will check other labs for his general health maintenance.  We will follow-up for a physical pending the results. He is not particularly keen about seeing the physical medicine and rehabilitation team.  I told him to reach out and see if they still wanted to see him with his improvement.  Continue working with physical therapy and Occupational Therapy.  Avoid flu shots in the future. The patient and his wife voiced understanding and agreement to the plan.  Greater than 40 minutes were spent with  the patient in addition to reviewing their chart information on the same day of the visit.    [place], DO 03/08/20 11:54 AM

## 2020-03-08 NOTE — Patient Instructions (Signed)
Give [place] 2-3 business days to get the results of your labs back.   Someone will reach out regarding your ultrasound.  Keep the diet clean and stay active.  Call Dr. Serita Grit office to see if you need to be seen for follow up. No need to see the neurology team at this point.   Let us know if you need anything.

## 2020-03-09 DIAGNOSIS — G610 Guillain-Barre syndrome: Secondary | ICD-10-CM | POA: Diagnosis not present

## 2020-03-09 DIAGNOSIS — N401 Enlarged prostate with lower urinary tract symptoms: Secondary | ICD-10-CM | POA: Diagnosis not present

## 2020-03-09 DIAGNOSIS — R338 Other retention of urine: Secondary | ICD-10-CM | POA: Diagnosis not present

## 2020-03-09 DIAGNOSIS — G4700 Insomnia, unspecified: Secondary | ICD-10-CM | POA: Diagnosis not present

## 2020-03-09 DIAGNOSIS — K5900 Constipation, unspecified: Secondary | ICD-10-CM | POA: Diagnosis not present

## 2020-03-09 DIAGNOSIS — E785 Hyperlipidemia, unspecified: Secondary | ICD-10-CM | POA: Diagnosis not present

## 2020-03-09 DIAGNOSIS — E871 Hypo-osmolality and hyponatremia: Secondary | ICD-10-CM | POA: Diagnosis not present

## 2020-03-09 DIAGNOSIS — E1142 Type 2 diabetes mellitus with diabetic polyneuropathy: Secondary | ICD-10-CM | POA: Diagnosis not present

## 2020-03-09 DIAGNOSIS — M4316 Spondylolisthesis, lumbar region: Secondary | ICD-10-CM | POA: Diagnosis not present

## 2020-03-09 DIAGNOSIS — I10 Essential (primary) hypertension: Secondary | ICD-10-CM | POA: Diagnosis not present

## 2020-03-11 DIAGNOSIS — K5900 Constipation, unspecified: Secondary | ICD-10-CM | POA: Diagnosis not present

## 2020-03-11 DIAGNOSIS — E1142 Type 2 diabetes mellitus with diabetic polyneuropathy: Secondary | ICD-10-CM | POA: Diagnosis not present

## 2020-03-11 DIAGNOSIS — R338 Other retention of urine: Secondary | ICD-10-CM | POA: Diagnosis not present

## 2020-03-11 DIAGNOSIS — E871 Hypo-osmolality and hyponatremia: Secondary | ICD-10-CM | POA: Diagnosis not present

## 2020-03-11 DIAGNOSIS — G4700 Insomnia, unspecified: Secondary | ICD-10-CM | POA: Diagnosis not present

## 2020-03-11 DIAGNOSIS — M4316 Spondylolisthesis, lumbar region: Secondary | ICD-10-CM | POA: Diagnosis not present

## 2020-03-11 DIAGNOSIS — E785 Hyperlipidemia, unspecified: Secondary | ICD-10-CM | POA: Diagnosis not present

## 2020-03-11 DIAGNOSIS — I10 Essential (primary) hypertension: Secondary | ICD-10-CM | POA: Diagnosis not present

## 2020-03-11 DIAGNOSIS — G610 Guillain-Barre syndrome: Secondary | ICD-10-CM | POA: Diagnosis not present

## 2020-03-11 DIAGNOSIS — N401 Enlarged prostate with lower urinary tract symptoms: Secondary | ICD-10-CM | POA: Diagnosis not present

## 2020-03-16 ENCOUNTER — Other Ambulatory Visit: Payer: Self-pay

## 2020-03-16 ENCOUNTER — Ambulatory Visit (HOSPITAL_BASED_OUTPATIENT_CLINIC_OR_DEPARTMENT_OTHER)
Admission: RE | Admit: 2020-03-16 | Discharge: 2020-03-16 | Disposition: A | Discharge status: Home / Self Care | Payer: Medicare HMO | Source: Ambulatory Visit | Attending: Family Medicine | Admitting: Family Medicine

## 2020-03-16 DIAGNOSIS — E785 Hyperlipidemia, unspecified: Secondary | ICD-10-CM | POA: Diagnosis not present

## 2020-03-16 DIAGNOSIS — E871 Hypo-osmolality and hyponatremia: Secondary | ICD-10-CM | POA: Diagnosis not present

## 2020-03-16 DIAGNOSIS — G610 Guillain-Barre syndrome: Secondary | ICD-10-CM | POA: Diagnosis not present

## 2020-03-16 DIAGNOSIS — Z87891 Personal history of nicotine dependence: Secondary | ICD-10-CM | POA: Diagnosis not present

## 2020-03-16 DIAGNOSIS — K5900 Constipation, unspecified: Secondary | ICD-10-CM | POA: Diagnosis not present

## 2020-03-16 DIAGNOSIS — R338 Other retention of urine: Secondary | ICD-10-CM | POA: Diagnosis not present

## 2020-03-16 DIAGNOSIS — Z136 Encounter for screening for cardiovascular disorders: Secondary | ICD-10-CM | POA: Insufficient documentation

## 2020-03-16 DIAGNOSIS — G4700 Insomnia, unspecified: Secondary | ICD-10-CM | POA: Diagnosis not present

## 2020-03-16 DIAGNOSIS — N401 Enlarged prostate with lower urinary tract symptoms: Secondary | ICD-10-CM | POA: Diagnosis not present

## 2020-03-16 DIAGNOSIS — E1142 Type 2 diabetes mellitus with diabetic polyneuropathy: Secondary | ICD-10-CM | POA: Diagnosis not present

## 2020-03-16 DIAGNOSIS — M4316 Spondylolisthesis, lumbar region: Secondary | ICD-10-CM | POA: Diagnosis not present

## 2020-03-16 DIAGNOSIS — I10 Essential (primary) hypertension: Secondary | ICD-10-CM | POA: Diagnosis not present

## 2020-03-17 ENCOUNTER — Ambulatory Visit (INDEPENDENT_AMBULATORY_CARE_PROVIDER_SITE_OTHER): Payer: Medicare HMO | Admitting: Sports Medicine

## 2020-03-17 ENCOUNTER — Encounter: Payer: Self-pay | Admitting: Sports Medicine

## 2020-03-17 DIAGNOSIS — E1142 Type 2 diabetes mellitus with diabetic polyneuropathy: Secondary | ICD-10-CM | POA: Diagnosis not present

## 2020-03-17 DIAGNOSIS — E1165 Type 2 diabetes mellitus with hyperglycemia: Secondary | ICD-10-CM

## 2020-03-17 DIAGNOSIS — G610 Guillain-Barre syndrome: Secondary | ICD-10-CM | POA: Diagnosis not present

## 2020-03-17 DIAGNOSIS — R338 Other retention of urine: Secondary | ICD-10-CM | POA: Diagnosis not present

## 2020-03-17 DIAGNOSIS — K5900 Constipation, unspecified: Secondary | ICD-10-CM | POA: Diagnosis not present

## 2020-03-17 DIAGNOSIS — M79675 Pain in left toe(s): Secondary | ICD-10-CM

## 2020-03-17 DIAGNOSIS — G629 Polyneuropathy, unspecified: Secondary | ICD-10-CM

## 2020-03-17 DIAGNOSIS — I10 Essential (primary) hypertension: Secondary | ICD-10-CM | POA: Diagnosis not present

## 2020-03-17 DIAGNOSIS — E871 Hypo-osmolality and hyponatremia: Secondary | ICD-10-CM | POA: Diagnosis not present

## 2020-03-17 DIAGNOSIS — N401 Enlarged prostate with lower urinary tract symptoms: Secondary | ICD-10-CM | POA: Diagnosis not present

## 2020-03-17 DIAGNOSIS — M79674 Pain in right toe(s): Secondary | ICD-10-CM

## 2020-03-17 DIAGNOSIS — E785 Hyperlipidemia, unspecified: Secondary | ICD-10-CM | POA: Diagnosis not present

## 2020-03-17 DIAGNOSIS — M4316 Spondylolisthesis, lumbar region: Secondary | ICD-10-CM | POA: Diagnosis not present

## 2020-03-17 DIAGNOSIS — B351 Tinea unguium: Secondary | ICD-10-CM | POA: Diagnosis not present

## 2020-03-17 DIAGNOSIS — G4700 Insomnia, unspecified: Secondary | ICD-10-CM | POA: Diagnosis not present

## 2020-03-17 NOTE — Progress Notes (Signed)
Subjective: Jon Townsend is a 65 y.o. male patient with history of diabetes who presents to office today complaining of long,mildly painful nails  while ambulating in shoes; unable to trim. Patient states that the glucose reading this morning was 131 and does not recall last A1c.  Patient reports that he was diagnosed with GBS and suffers with a lot of edema to feet and legs as well as tingling.  Patient is currently on gabapentin roughly started about a month and a half ago.  No other issues noted.  Review of system noncontributory.  Patient Active Problem List   Diagnosis Date Noted  . Acute blood loss anemia   . Bloody stool   . Controlled type 2 diabetes mellitus with hyperglycemia, without long-term current use of insulin ([place])   . Guillain Barr syndrome ([place]) 02/19/2020  . Weakness 02/03/2020  . Essential hypertension 02/03/2020  . Polyneuropathy 02/03/2020  . COVID-19 virus infection 02/03/2020  . Diabetes mellitus type 2 in obese ([place])   . BPH (benign prostatic hyperplasia)   . Chronic right-sided low back pain without sciatica 04/03/2016  . H/O urinary frequency 04/03/2016  . Annual physical exam 02/03/2016  . Low testosterone level in male 01/15/2015  . ADD (attention deficit disorder) 01/14/2015  . Class 1 obesity in adult 01/14/2015  . Elevated BP without diagnosis of hypertension 01/14/2015  . Lumbar paraspinal muscle spasm 01/14/2015  . Rosacea 01/14/2015   Current Outpatient Medications on File Prior to Visit  Medication Sig Dispense Refill  . amLODipine (NORVASC) 5 MG tablet Take 1 tablet (5 mg total) by mouth daily. 90 tablet 3  . atorvastatin (LIPITOR) 40 MG tablet Take 1 tablet (40 mg total) by mouth daily. (Patient taking differently: Take 40 mg by mouth at bedtime.) 90 tablet 3  . Coenzyme Q10 (COQ10) 100 MG CAPS Take 100 mg by mouth in the morning.    . fexofenadine (ALLEGRA) 180 MG tablet Take 180 mg by mouth in the morning.    . gabapentin (NEURONTIN) 300  MG capsule Take 1 capsule (300 mg total) by mouth 3 (three) times daily. 90 capsule 0  . glucose blood (ONETOUCH VERIO) test strip Use as once daily to check blood sugar. 100 each 11  . Krill Oil 1000 MG CAPS Take 1,000 mg by mouth daily.    . Lancets (ONETOUCH ULTRASOFT) lancets Use daily to check blood sugar. 100 each 3  . latanoprost (XALATAN) 0.005 % ophthalmic solution Place 1 drop into both eyes at bedtime.    . magnesium oxide (MAG-OX) 400 (241.3 Mg) MG tablet Take 1 tablet (400 mg total) by mouth daily.    . melatonin 10 MG TABS Take 10 mg by mouth at bedtime as needed (sleep).  0  . metFORMIN (GLUCOPHAGE-XR) 500 MG 24 hr tablet Take 2 tablets (1,000 mg total) by mouth daily with breakfast. 60 tablet 5  . metoprolol tartrate (LOPRESSOR) 50 MG tablet Take 1 tablet (50 mg total) by mouth 2 (two) times daily. 180 tablet 3  . tamsulosin (FLOMAX) 0.4 MG CAPS capsule Take 2 capsules (0.8 mg total) by mouth daily after supper. 60 capsule 0  . vitamin C (ASCORBIC ACID) 500 MG tablet Take 500-1,000 mg by mouth daily.     No current facility-administered medications on file prior to visit.   No Known Allergies    Objective: General: Patient is awake, alert, and oriented x 3 and in no acute distress.  Integument: Skin is warm, dry and supple bilateral. Nails are  tender, long, thickened and dystrophic with subungual debris, consistent with onychomycosis, 1-5 bilateral with partial lifting of right hallux nail history of microtrauma. No signs of infection. No open lesions or preulcerative lesions present bilateral. Remaining integument unremarkable.  Vasculature:  Dorsalis Pedis pulse 1/4 bilateral. Posterior Tibial pulse 1/4 bilateral. Capillary fill time <3 sec 1-5 bilateral. Positive hair growth to the level of the digits.Temperature gradient within normal limits. No varicosities present bilateral. No edema present bilateral.   Neurology: The patient has diminished sensation measured with a  5.07/10g Semmes Weinstein Monofilament at toes bilateral.  Vibratory sensation diminished bilateral with tuning fork. No Babinski sign present bilateral.   Musculoskeletal: Asymptomatic hammertoe pedal deformities noted bilateral. Muscular strength 4/5 in all lower extremity muscular groups bilateral without pain on range of motion.  History of GBS. No tenderness with calf compression bilateral.  Assessment and Plan: Problem List Items Addressed This Visit      Endocrine   Controlled type 2 diabetes mellitus with hyperglycemia, without long-term current use of insulin (HCC)     Nervous and Auditory   Polyneuropathy   Guillain Barr syndrome (HCC)    Other Visit Diagnoses    Pain due to onychomycosis of toenails of both feet    -  Primary      -Examined patient. -Discussed and educated patient on diabetic foot care, especially with  regards to the vascular, neurological and musculoskeletal systems.  -Stressed the importance of good glycemic control and the detriment of not  controlling glucose levels in relation to the foot. -Mechanically debrided all nails 1-5 bilateral using sterile nail nipper and filed with dremel without incident  -Advised patient to continue to follow-up with his doctor in regards to his GBS and nerve issues -Answered all patient questions -Patient to return  in 3 months for at risk foot care -Patient advised to call the office if any problems or questions arise in the meantime.  [name], DPM

## 2020-03-17 NOTE — Patient Instructions (Signed)
Diabetes Mellitus and Foot Care Foot care is an important part of your health, especially when you have diabetes. Diabetes may cause you to have problems because of poor blood flow (circulation) to your feet and legs, which can cause your skin to:  Become thinner and drier.  Break more easily.  Heal more slowly.  Peel and crack. You may also have nerve damage (neuropathy) in your legs and feet, causing decreased feeling in them. This means that you may not notice minor injuries to your feet that could lead to more serious problems. Noticing and addressing any potential problems early is the best way to prevent future foot problems. How to care for your feet Foot hygiene  Wash your feet daily with warm water and mild soap. Do not use hot water. Then, pat your feet and the areas between your toes until they are completely dry. Do not soak your feet as this can dry your skin.  Trim your toenails straight across. Do not dig under them or around the cuticle. File the edges of your nails with an emery board or nail file.  Apply a moisturizing lotion or petroleum jelly to the skin on your feet and to dry, brittle toenails. Use lotion that does not contain alcohol and is unscented. Do not apply lotion between your toes.   Shoes and socks  Wear clean socks or stockings every day. Make sure they are not too tight. Do not wear knee-high stockings since they may decrease blood flow to your legs.  Wear shoes that fit properly and have enough cushioning. Always look in your shoes before you put them on to be sure there are no objects inside.  To break in new shoes, wear them for just a few hours a day. This prevents injuries on your feet. Wounds, scrapes, corns, and calluses  Check your feet daily for blisters, cuts, bruises, sores, and redness. If you cannot see the bottom of your feet, use a mirror or ask someone for help.  Do not cut corns or calluses or try to remove them with medicine.  If you  find a minor scrape, cut, or break in the skin on your feet, keep it and the skin around it clean and dry. You may clean these areas with mild soap and water. Do not clean the area with peroxide, alcohol, or iodine.  If you have a wound, scrape, corn, or callus on your foot, look at it several times a day to make sure it is healing and not infected. Check for: ? Redness, swelling, or pain. ? Fluid or blood. ? Warmth. ? Pus or a bad smell.   General tips  Do not cross your legs. This may decrease blood flow to your feet.  Do not use heating pads or hot water bottles on your feet. They may burn your skin. If you have lost feeling in your feet or legs, you may not know this is happening until it is too late.  Protect your feet from hot and cold by wearing shoes, such as at the beach or on hot pavement.  Schedule a complete foot exam at least once a year (annually) or more often if you have foot problems. Report any cuts, sores, or bruises to your health care provider immediately. Where to find more information  American Diabetes Association: www.diabetes.org  Association of Diabetes Care & Education Specialists: www.diabeteseducator.org Contact a health care provider if:  You have a medical condition that increases your risk of infection and   you have any cuts, sores, or bruises on your feet.  You have an injury that is not healing.  You have redness on your legs or feet.  You feel burning or tingling in your legs or feet.  You have pain or cramps in your legs and feet.  Your legs or feet are numb.  Your feet always feel cold.  You have pain around any toenails. Get help right away if:  You have a wound, scrape, corn, or callus on your foot and: ? You have pain, swelling, or redness that gets worse. ? You have fluid or blood coming from the wound, scrape, corn, or callus. ? Your wound, scrape, corn, or callus feels warm to the touch. ? You have pus or a bad smell coming from  the wound, scrape, corn, or callus. ? You have a fever. ? You have a red line going up your leg. Summary  Check your feet every day for blisters, cuts, bruises, sores, and redness.  Apply a moisturizing lotion or petroleum jelly to the skin on your feet and to dry, brittle toenails.  Wear shoes that fit properly and have enough cushioning.  If you have foot problems, report any cuts, sores, or bruises to your health care provider immediately.  Schedule a complete foot exam at least once a year (annually) or more often if you have foot problems. This information is not intended to replace advice given to you by your health care provider. Make sure you discuss any questions you have with your health care provider. Document Revised: 07/17/2019 Document Reviewed: 07/17/2019 Elsevier Patient Education  2021 Elsevier Inc.  

## 2020-03-22 DIAGNOSIS — K5900 Constipation, unspecified: Secondary | ICD-10-CM | POA: Diagnosis not present

## 2020-03-22 DIAGNOSIS — E785 Hyperlipidemia, unspecified: Secondary | ICD-10-CM | POA: Diagnosis not present

## 2020-03-22 DIAGNOSIS — R338 Other retention of urine: Secondary | ICD-10-CM | POA: Diagnosis not present

## 2020-03-22 DIAGNOSIS — G4700 Insomnia, unspecified: Secondary | ICD-10-CM | POA: Diagnosis not present

## 2020-03-22 DIAGNOSIS — I10 Essential (primary) hypertension: Secondary | ICD-10-CM | POA: Diagnosis not present

## 2020-03-22 DIAGNOSIS — G610 Guillain-Barre syndrome: Secondary | ICD-10-CM | POA: Diagnosis not present

## 2020-03-22 DIAGNOSIS — E871 Hypo-osmolality and hyponatremia: Secondary | ICD-10-CM | POA: Diagnosis not present

## 2020-03-22 DIAGNOSIS — E1142 Type 2 diabetes mellitus with diabetic polyneuropathy: Secondary | ICD-10-CM | POA: Diagnosis not present

## 2020-03-22 DIAGNOSIS — N401 Enlarged prostate with lower urinary tract symptoms: Secondary | ICD-10-CM | POA: Diagnosis not present

## 2020-03-22 DIAGNOSIS — M4316 Spondylolisthesis, lumbar region: Secondary | ICD-10-CM | POA: Diagnosis not present

## 2020-03-23 DIAGNOSIS — E871 Hypo-osmolality and hyponatremia: Secondary | ICD-10-CM | POA: Diagnosis not present

## 2020-03-23 DIAGNOSIS — I10 Essential (primary) hypertension: Secondary | ICD-10-CM | POA: Diagnosis not present

## 2020-03-23 DIAGNOSIS — R338 Other retention of urine: Secondary | ICD-10-CM | POA: Diagnosis not present

## 2020-03-23 DIAGNOSIS — E1142 Type 2 diabetes mellitus with diabetic polyneuropathy: Secondary | ICD-10-CM | POA: Diagnosis not present

## 2020-03-23 DIAGNOSIS — K5900 Constipation, unspecified: Secondary | ICD-10-CM | POA: Diagnosis not present

## 2020-03-23 DIAGNOSIS — G610 Guillain-Barre syndrome: Secondary | ICD-10-CM | POA: Diagnosis not present

## 2020-03-23 DIAGNOSIS — E785 Hyperlipidemia, unspecified: Secondary | ICD-10-CM | POA: Diagnosis not present

## 2020-03-23 DIAGNOSIS — M4316 Spondylolisthesis, lumbar region: Secondary | ICD-10-CM | POA: Diagnosis not present

## 2020-03-23 DIAGNOSIS — G4700 Insomnia, unspecified: Secondary | ICD-10-CM | POA: Diagnosis not present

## 2020-03-23 DIAGNOSIS — N401 Enlarged prostate with lower urinary tract symptoms: Secondary | ICD-10-CM | POA: Diagnosis not present

## 2020-03-25 ENCOUNTER — Telehealth: Payer: Self-pay | Admitting: Family Medicine

## 2020-03-25 DIAGNOSIS — G610 Guillain-Barre syndrome: Secondary | ICD-10-CM | POA: Diagnosis not present

## 2020-03-25 DIAGNOSIS — I10 Essential (primary) hypertension: Secondary | ICD-10-CM | POA: Diagnosis not present

## 2020-03-25 DIAGNOSIS — K5900 Constipation, unspecified: Secondary | ICD-10-CM | POA: Diagnosis not present

## 2020-03-25 DIAGNOSIS — R338 Other retention of urine: Secondary | ICD-10-CM | POA: Diagnosis not present

## 2020-03-25 DIAGNOSIS — M4316 Spondylolisthesis, lumbar region: Secondary | ICD-10-CM | POA: Diagnosis not present

## 2020-03-25 DIAGNOSIS — N401 Enlarged prostate with lower urinary tract symptoms: Secondary | ICD-10-CM | POA: Diagnosis not present

## 2020-03-25 DIAGNOSIS — E1142 Type 2 diabetes mellitus with diabetic polyneuropathy: Secondary | ICD-10-CM | POA: Diagnosis not present

## 2020-03-25 DIAGNOSIS — E871 Hypo-osmolality and hyponatremia: Secondary | ICD-10-CM | POA: Diagnosis not present

## 2020-03-25 DIAGNOSIS — E785 Hyperlipidemia, unspecified: Secondary | ICD-10-CM | POA: Diagnosis not present

## 2020-03-25 DIAGNOSIS — G4700 Insomnia, unspecified: Secondary | ICD-10-CM | POA: Diagnosis not present

## 2020-03-25 NOTE — Telephone Encounter (Signed)
Jon Townsend from [facility] want to extent  Therapy to  1 X a week for 3 weeks

## 2020-03-25 NOTE — Telephone Encounter (Signed)
Called to give verbal order and left Vm to call back. -JMA

## 2020-03-29 DIAGNOSIS — E871 Hypo-osmolality and hyponatremia: Secondary | ICD-10-CM | POA: Diagnosis not present

## 2020-03-29 DIAGNOSIS — E1142 Type 2 diabetes mellitus with diabetic polyneuropathy: Secondary | ICD-10-CM | POA: Diagnosis not present

## 2020-03-29 DIAGNOSIS — N401 Enlarged prostate with lower urinary tract symptoms: Secondary | ICD-10-CM | POA: Diagnosis not present

## 2020-03-29 DIAGNOSIS — G610 Guillain-Barre syndrome: Secondary | ICD-10-CM | POA: Diagnosis not present

## 2020-03-29 DIAGNOSIS — E785 Hyperlipidemia, unspecified: Secondary | ICD-10-CM | POA: Diagnosis not present

## 2020-03-29 DIAGNOSIS — K5900 Constipation, unspecified: Secondary | ICD-10-CM | POA: Diagnosis not present

## 2020-03-29 DIAGNOSIS — G4700 Insomnia, unspecified: Secondary | ICD-10-CM | POA: Diagnosis not present

## 2020-03-29 DIAGNOSIS — R338 Other retention of urine: Secondary | ICD-10-CM | POA: Diagnosis not present

## 2020-03-29 DIAGNOSIS — I10 Essential (primary) hypertension: Secondary | ICD-10-CM | POA: Diagnosis not present

## 2020-03-29 DIAGNOSIS — M4316 Spondylolisthesis, lumbar region: Secondary | ICD-10-CM | POA: Diagnosis not present

## 2020-04-02 MED ORDER — METFORMIN HCL ER 500 MG PO TB24: 1000.0000 mg | ORAL_TABLET | Freq: Every day | ORAL | 3 refills | Status: DC

## 2020-04-05 ENCOUNTER — Encounter: Payer: Medicare HMO | Admitting: Physical Medicine & Rehabilitation

## 2020-04-05 MED ORDER — GABAPENTIN 300 MG PO CAPS: 300.0000 mg | ORAL_CAPSULE | Freq: Three times a day (TID) | ORAL | 1 refills | Status: DC

## 2020-04-06 DIAGNOSIS — I10 Essential (primary) hypertension: Secondary | ICD-10-CM | POA: Diagnosis not present

## 2020-04-06 DIAGNOSIS — E1142 Type 2 diabetes mellitus with diabetic polyneuropathy: Secondary | ICD-10-CM | POA: Diagnosis not present

## 2020-04-06 DIAGNOSIS — R338 Other retention of urine: Secondary | ICD-10-CM | POA: Diagnosis not present

## 2020-04-06 DIAGNOSIS — G4700 Insomnia, unspecified: Secondary | ICD-10-CM | POA: Diagnosis not present

## 2020-04-06 DIAGNOSIS — E785 Hyperlipidemia, unspecified: Secondary | ICD-10-CM | POA: Diagnosis not present

## 2020-04-06 DIAGNOSIS — N401 Enlarged prostate with lower urinary tract symptoms: Secondary | ICD-10-CM | POA: Diagnosis not present

## 2020-04-06 DIAGNOSIS — G610 Guillain-Barre syndrome: Secondary | ICD-10-CM | POA: Diagnosis not present

## 2020-04-06 DIAGNOSIS — M4316 Spondylolisthesis, lumbar region: Secondary | ICD-10-CM | POA: Diagnosis not present

## 2020-04-06 DIAGNOSIS — E871 Hypo-osmolality and hyponatremia: Secondary | ICD-10-CM | POA: Diagnosis not present

## 2020-04-06 DIAGNOSIS — K5900 Constipation, unspecified: Secondary | ICD-10-CM | POA: Diagnosis not present

## 2020-04-07 ENCOUNTER — Encounter: Payer: Self-pay | Admitting: Family Medicine

## 2020-04-07 ENCOUNTER — Ambulatory Visit (INDEPENDENT_AMBULATORY_CARE_PROVIDER_SITE_OTHER): Payer: Medicare HMO | Admitting: Family Medicine

## 2020-04-07 ENCOUNTER — Other Ambulatory Visit: Payer: Self-pay

## 2020-04-07 VITALS — BP 110/68 | HR 57 | Temp 97.9°F | Ht 73.0 in | Wt 246.2 lb

## 2020-04-07 DIAGNOSIS — Z0000 Encounter for general adult medical examination without abnormal findings: Secondary | ICD-10-CM | POA: Diagnosis not present

## 2020-04-07 DIAGNOSIS — E781 Pure hyperglyceridemia: Secondary | ICD-10-CM | POA: Diagnosis not present

## 2020-04-07 LAB — LIPID PANEL
Cholesterol: 106 mg/dL (ref 0–200)
HDL: 34.9 mg/dL — ABNORMAL LOW (ref >39.00)
LDL Cholesterol: 49 mg/dL (ref 0–99)
NonHDL: 71.39
Total CHOL/HDL Ratio: 3
Triglycerides: 113 mg/dL (ref 0.0–149.0)
VLDL: 22.6 mg/dL (ref 0.0–40.0)

## 2020-04-07 NOTE — Progress Notes (Signed)
Chief Complaint  Patient presents with  . Annual Exam    [name] is here for a complete physical.   His last physical was >1 year ago.  Current diet: in general, a "healthy" diet.   Current exercise: walking, working w PT for GBS Weight trend: stable Fatigue out of ordinary? No. Seat belt? Yes.   Loss of interested in doing things or depression in past 2 weeks? No  Health maintenance Shingrix- Yes Colonoscopy- Yes Tetanus- Yes Hep C- Yes Pneumonia vaccine- Yes AAA screening- Yes  Past Medical History:  Diagnosis Date  . Allergy   . BPH (benign prostatic hyperplasia)   . Diabetes mellitus type 2 in obese ([place])   . Glaucoma   . Hypertension      Past Surgical History:  Procedure Laterality Date  . Broken finger    . TONSILLECTOMY      Medications  Current Outpatient Medications on File Prior to Visit  Medication Sig Dispense Refill  . amLODipine (NORVASC) 5 MG tablet Take 1 tablet (5 mg total) by mouth daily. 90 tablet 3  . atorvastatin (LIPITOR) 40 MG tablet Take 1 tablet (40 mg total) by mouth daily. (Patient taking differently: Take 40 mg by mouth at bedtime.) 90 tablet 3  . Coenzyme Q10 (COQ10) 100 MG CAPS Take 100 mg by mouth in the morning.    . fexofenadine (ALLEGRA) 180 MG tablet Take 180 mg by mouth in the morning.    . gabapentin (NEURONTIN) 300 MG capsule Take 1 capsule (300 mg total) by mouth 3 (three) times daily. 270 capsule 1  . glucose blood (ONETOUCH VERIO) test strip Use as once daily to check blood sugar. 100 each 11  . Krill Oil 1000 MG CAPS Take 1,000 mg by mouth daily.    . Lancets (ONETOUCH ULTRASOFT) lancets Use daily to check blood sugar. 100 each 3  . latanoprost (XALATAN) 0.005 % ophthalmic solution Place 1 drop into both eyes at bedtime.    . magnesium oxide (MAG-OX) 400 (241.3 Mg) MG tablet Take 1 tablet (400 mg total) by mouth daily.    . melatonin 10 MG TABS Take 10 mg by mouth at bedtime as needed (sleep).  0  .  metFORMIN (GLUCOPHAGE-XR) 500 MG 24 hr tablet Take 2 tablets (1,000 mg total) by mouth daily with breakfast. 180 tablet 3  . metoprolol tartrate (LOPRESSOR) 50 MG tablet Take 1 tablet (50 mg total) by mouth 2 (two) times daily. 180 tablet 3  . tamsulosin (FLOMAX) 0.4 MG CAPS capsule Take 2 capsules (0.8 mg total) by mouth daily after supper. 60 capsule 0  . vitamin C (ASCORBIC ACID) 500 MG tablet Take 500-1,000 mg by mouth daily.     Allergies No Known Allergies  Family History Family History  Problem Relation Age of Onset  . Cancer Father   . Early death Father   . Diabetes Sister   . Heart disease Sister   . COPD Sister   . Diabetes Sister   . Colon cancer Neg Hx   . Esophageal cancer Neg Hx   . Stomach cancer Neg Hx   . Rectal cancer Neg Hx     Review of Systems: Constitutional:  no fevers Eye:  no recent significant change in vision Ears:  No changes in hearing Nose/Mouth/Throat:  no complaints of nasal congestion, no sore throat Cardiovascular: no chest pain Respiratory:  No shortness of breath Gastrointestinal:  No change in bowel habits GU:  No frequency Integumentary:  no abnormal skin lesions reported Neurologic:  no headaches Endocrine:  denies unexplained weight changes  Exam BP 110/68 (BP Location: Left Arm, Patient Position: Sitting, Cuff Size: Normal)   Pulse (!) 57   Temp 97.9 F (36.6 C) (Oral)   Ht 6\' 1"  (1.854 m)   Wt 246 lb 4 oz (111.7 kg)   SpO2 97%   BMI 32.49 kg/m  General:  well developed, well nourished, in no apparent distress Skin:  no significant moles, warts, or growths Head:  no masses, lesions, or tenderness Eyes:  pupils equal and round, sclera anicteric without injection Ears:  canals without lesions, TMs shiny without retraction, no obvious effusion, no erythema Nose:  nares patent, septum midline, mucosa normal Throat/Pharynx:  lips and gingiva without lesion; tongue and uvula midline; non-inflamed pharynx; no exudates or postnasal  drainage Lungs:  clear to auscultation, breath sounds equal bilaterally, no respiratory distress Cardio:  regular rate and rhythm, no LE edema or bruits Rectal: Deferred GI: BS+, S, NT, ND, no masses or organomegaly Musculoskeletal:  symmetrical muscle groups noted without atrophy or deformity Neuro:  gait normal; deep tendon reflexes normal and symmetric Psych: well oriented with normal range of affect and appropriate judgment/insight  Assessment and Plan  Well adult exam  Hypertriglyceridemia - Plan: Lipid panel   Well 65 y.o. male. Counseled on diet and exercise. Other orders as above. Follow up in 5 mo.  The patient voiced understanding and agreement to the plan.  Tom Green, DO 04/07/20 8:58 AM

## 2020-04-07 NOTE — Patient Instructions (Signed)
Give us 2-3 business days to get the results of your labs back.   Keep the diet clean and stay active.  Let us know if you need anything. 

## 2020-04-13 DIAGNOSIS — E1142 Type 2 diabetes mellitus with diabetic polyneuropathy: Secondary | ICD-10-CM | POA: Diagnosis not present

## 2020-04-13 DIAGNOSIS — R338 Other retention of urine: Secondary | ICD-10-CM | POA: Diagnosis not present

## 2020-04-13 DIAGNOSIS — M4316 Spondylolisthesis, lumbar region: Secondary | ICD-10-CM | POA: Diagnosis not present

## 2020-04-13 DIAGNOSIS — E785 Hyperlipidemia, unspecified: Secondary | ICD-10-CM | POA: Diagnosis not present

## 2020-04-13 DIAGNOSIS — E871 Hypo-osmolality and hyponatremia: Secondary | ICD-10-CM | POA: Diagnosis not present

## 2020-04-13 DIAGNOSIS — N401 Enlarged prostate with lower urinary tract symptoms: Secondary | ICD-10-CM | POA: Diagnosis not present

## 2020-04-13 DIAGNOSIS — G610 Guillain-Barre syndrome: Secondary | ICD-10-CM | POA: Diagnosis not present

## 2020-04-13 DIAGNOSIS — K5900 Constipation, unspecified: Secondary | ICD-10-CM | POA: Diagnosis not present

## 2020-04-13 DIAGNOSIS — G4700 Insomnia, unspecified: Secondary | ICD-10-CM | POA: Diagnosis not present

## 2020-04-13 DIAGNOSIS — I10 Essential (primary) hypertension: Secondary | ICD-10-CM | POA: Diagnosis not present

## 2020-04-22 ENCOUNTER — Encounter: Payer: Self-pay | Admitting: Orthopaedic Surgery

## 2020-04-22 ENCOUNTER — Other Ambulatory Visit: Payer: Self-pay

## 2020-04-22 ENCOUNTER — Ambulatory Visit: Payer: Medicare HMO | Admitting: Orthopaedic Surgery

## 2020-04-22 DIAGNOSIS — M1711 Unilateral primary osteoarthritis, right knee: Secondary | ICD-10-CM

## 2020-04-22 DIAGNOSIS — M1611 Unilateral primary osteoarthritis, right hip: Secondary | ICD-10-CM | POA: Diagnosis not present

## 2020-04-22 MED ORDER — MELOXICAM 7.5 MG PO TABS: 7.5000 mg | ORAL_TABLET | Freq: Two times a day (BID) | ORAL | 2 refills | Status: DC | PRN

## 2020-04-22 NOTE — Progress Notes (Signed)
Office Visit Note   Patient: Jon Townsend           Date of Birth: 07/14/1955           MRN: [id number] Visit Date: 04/22/2020              Requested by: [name], [address] [place],  [place] [address] PCP: [name], DO   Assessment & Plan: Visit Diagnoses:  1. Primary osteoarthritis of right knee   2. Primary osteoarthritis of right hip     Plan: My impression of what is going on is that that he has arthritis in both his hip and knee but due to recent illness with Guillain-Barr this has caused his symptoms to be more severe.  He is noticeably weaker to his hip and knee muscles.  I do not get the impression that there is anything structural going on other than the underlying arthritis.  We talked about trying injections if the pain gets bad enough but right now it seems like it is manageable.  He will continue to work on strengthening to his hip and knee.  I did refill prescription of meloxicam.  He will check with his PCP to make sure that this is okay to take while he is recovering from [place].  Follow-Up Instructions: Return if symptoms worsen or fail to improve.   Orders:  No orders of the defined types were placed in this encounter.  Meds ordered this encounter  Medications  . meloxicam (MOBIC) 7.5 MG tablet    Sig: Take 1 tablet (7.5 mg total) by mouth 2 (two) times daily as needed for pain.    Dispense:  60 tablet    Refill:  2      Procedures: No procedures performed   Clinical Data: No additional findings.   Subjective: Chief Complaint  Patient presents with  . Right Hip - Pain  . Right Knee - Pain    Jon Townsend is a 65 year old gentleman husband of Aemon Koeller comes in for evaluation of this problem separate issues.  He is having right knee and right hip and groin pain with a pulling sensation.  The leg wants to give out.  The knee buckles at times especially with sit to stand transition.  Denies  any swelling.  He has some trouble bending over due to the hip.  Reports that he was recently hospitalized for Guillain-Barr syndrome.  Fortunately he has not had any permanent effects from this.  If he continues to make steady progress.   Review of Systems  Constitutional: Negative.   All other systems reviewed and are negative.    Objective: Vital Signs: There were no vitals taken for this visit.  Physical Exam Vitals and nursing note reviewed.  Constitutional:      Appearance: He is well-developed.  Pulmonary:     Effort: Pulmonary effort is normal.  Abdominal:     Palpations: Abdomen is soft.  Skin:    General: Skin is warm.  Neurological:     Mental Status: He is alert and oriented to person, place, and time.  Psychiatric:        Behavior: Behavior normal.        Thought Content: Thought content normal.        Judgment: Judgment normal.     Ortho Exam Right knee shows no joint effusion.  Range of motion is normal.  Collaterals and cruciates are stable.  No joint line tenderness.  Minimal crepitus with range of motion.  No significant pain with range of motion.  Right hip shows decent range of motion without any catching pain.  Negative logroll and negative FADIR.  No sciatic tension signs.  Nontender to palpation. Specialty Comments:  No specialty comments available.  Imaging: No results found.   PMFS History: Patient Active Problem List   Diagnosis Date Noted  . Acute blood loss anemia   . Bloody stool   . Controlled type 2 diabetes mellitus with hyperglycemia, without long-term current use of insulin ([place])   . Guillain Barr syndrome ([place]) 02/19/2020  . Weakness 02/03/2020  . Essential hypertension 02/03/2020  . Polyneuropathy 02/03/2020  . COVID-19 virus infection 02/03/2020  . Diabetes mellitus type 2 in obese ([place])   . BPH (benign prostatic hyperplasia)   . Chronic right-sided low back pain without sciatica 04/03/2016  . H/O urinary frequency  04/03/2016  . Annual physical exam 02/03/2016  . Low testosterone level in male 01/15/2015  . ADD (attention deficit disorder) 01/14/2015  . Class 1 obesity in adult 01/14/2015  . Elevated BP without diagnosis of hypertension 01/14/2015  . Lumbar paraspinal muscle spasm 01/14/2015  . Rosacea 01/14/2015   Past Medical History:  Diagnosis Date  . Allergy   . BPH (benign prostatic hyperplasia)   . Diabetes mellitus type 2 in obese ([place])   . Glaucoma   . Hypertension     Family History  Problem Relation Age of Onset  . Cancer Father   . Early death Father   . Diabetes Sister   . Heart disease Sister   . COPD Sister   . Diabetes Sister   . Colon cancer Neg Hx   . Esophageal cancer Neg Hx   . Stomach cancer Neg Hx   . Rectal cancer Neg Hx     Past Surgical History:  Procedure Laterality Date  . Broken finger    . TONSILLECTOMY     Social History   Occupational History  . Not on file  Tobacco Use  . Smoking status: Former Research scientist (life sciences)  . Smokeless tobacco: Never Used  . Tobacco comment: Quit 35 years ago  Substance and Sexual Activity  . Alcohol use: Never  . Drug use: Never  . Sexual activity: Not on file

## 2020-06-18 ENCOUNTER — Encounter: Payer: Self-pay | Admitting: Sports Medicine

## 2020-06-18 ENCOUNTER — Ambulatory Visit (INDEPENDENT_AMBULATORY_CARE_PROVIDER_SITE_OTHER): Payer: Medicare HMO | Admitting: Sports Medicine

## 2020-06-18 ENCOUNTER — Other Ambulatory Visit: Payer: Self-pay

## 2020-06-18 DIAGNOSIS — E1165 Type 2 diabetes mellitus with hyperglycemia: Secondary | ICD-10-CM

## 2020-06-18 DIAGNOSIS — M79675 Pain in left toe(s): Secondary | ICD-10-CM

## 2020-06-18 DIAGNOSIS — M79674 Pain in right toe(s): Secondary | ICD-10-CM

## 2020-06-18 DIAGNOSIS — B351 Tinea unguium: Secondary | ICD-10-CM | POA: Diagnosis not present

## 2020-06-18 DIAGNOSIS — I739 Peripheral vascular disease, unspecified: Secondary | ICD-10-CM

## 2020-06-18 DIAGNOSIS — G610 Guillain-Barre syndrome: Secondary | ICD-10-CM

## 2020-06-18 DIAGNOSIS — G629 Polyneuropathy, unspecified: Secondary | ICD-10-CM

## 2020-06-18 NOTE — Progress Notes (Signed)
Subjective: Jon Townsend is a 65 y.o. male patient with history of diabetes who presents to office today complaining of long,mildly painful nails  while ambulating in shoes; unable to trim. Patient states that the glucose reading this morning was not recorded but has been doing well and does not recall last A1c.  Last visit to PCP was 1 month ago.  Reports that he has been in and out of hospital due to his GBS. No other issues noted.  Patient Active Problem List   Diagnosis Date Noted   Acute blood loss anemia    Bloody stool    Controlled type 2 diabetes mellitus with hyperglycemia, without long-term current use of insulin ([place])    Guillain Barr syndrome ([place]) 02/19/2020   Weakness 02/03/2020   Essential hypertension 02/03/2020   Polyneuropathy 02/03/2020   COVID-19 virus infection 02/03/2020   Diabetes mellitus type 2 in obese [facility])    BPH (benign prostatic hyperplasia)    Chronic right-sided low back pain without sciatica 04/03/2016   H/O urinary frequency 04/03/2016   Annual physical exam 02/03/2016   Low testosterone level in male 01/15/2015   ADD (attention deficit disorder) 01/14/2015   Class 1 obesity in adult 01/14/2015   Elevated BP without diagnosis of hypertension 01/14/2015   Lumbar paraspinal muscle spasm 01/14/2015   Rosacea 01/14/2015   Current Outpatient Medications on File Prior to Visit  Medication Sig Dispense Refill   amLODipine (NORVASC) 5 MG tablet Take 1 tablet (5 mg total) by mouth daily. 90 tablet 3   amoxicillin (AMOXIL) 500 MG capsule Take 500 mg by mouth 3 (three) times daily.     atorvastatin (LIPITOR) 40 MG tablet Take 1 tablet (40 mg total) by mouth daily. (Patient taking differently: Take 40 mg by mouth at bedtime.) 90 tablet 3   Coenzyme Q10 (COQ10) 100 MG CAPS Take 100 mg by mouth in the morning.     fexofenadine (ALLEGRA) 180 MG tablet Take 180 mg by mouth in the morning.     gabapentin (NEURONTIN) 300 MG capsule Take 1 capsule (300 mg total) by  mouth 3 (three) times daily. 270 capsule 1   glucose blood (ONETOUCH VERIO) test strip Use as once daily to check blood sugar. 100 each 11   Krill Oil 1000 MG CAPS Take 1,000 mg by mouth daily.     Lancets (ONETOUCH ULTRASOFT) lancets Use daily to check blood sugar. 100 each 3   latanoprost (XALATAN) 0.005 % ophthalmic solution Place 1 drop into both eyes at bedtime.     magnesium oxide (MAG-OX) 400 (241.3 Mg) MG tablet Take 1 tablet (400 mg total) by mouth daily.     melatonin 10 MG TABS Take 10 mg by mouth at bedtime as needed (sleep).  0   meloxicam (MOBIC) 7.5 MG tablet Take 1 tablet (7.5 mg total) by mouth 2 (two) times daily as needed for pain. 60 tablet 2   metFORMIN (GLUCOPHAGE-XR) 500 MG 24 hr tablet Take 2 tablets (1,000 mg total) by mouth daily with breakfast. 180 tablet 3   metoprolol tartrate (LOPRESSOR) 50 MG tablet Take 1 tablet (50 mg total) by mouth 2 (two) times daily. 180 tablet 3   tamsulosin (FLOMAX) 0.4 MG CAPS capsule Take 2 capsules (0.8 mg total) by mouth daily after supper. 60 capsule 0   vitamin C (ASCORBIC ACID) 500 MG tablet Take 500-1,000 mg by mouth daily.     No current facility-administered medications on file prior to visit.   No Known Allergies  Objective: General: Patient is awake, alert, and oriented x 3 and in no acute distress.  Integument: Skin is warm, dry and supple bilateral. Nails are tender, long, thickened and dystrophic with subungual debris, consistent with onychomycosis, 1-5 bilateral with partial lifting of right hallux nail history of microtrauma. No signs of infection. No open lesions or preulcerative lesions present bilateral. Remaining integument unremarkable.  Vasculature:  Dorsalis Pedis pulse 1/4 bilateral. Posterior Tibial pulse 1/4 bilateral. Capillary fill time <3 sec 1-5 bilateral. Positive hair growth to the level of the digits.Temperature gradient within normal limits.  Brawny pigmentation present bilateral.  Trace edema present  bilateral.   Neurology: The patient has diminished sensation measured with a 5.07/10g Semmes Weinstein Monofilament at toes bilateral.  Vibratory sensation diminished bilateral with tuning fork. No Babinski sign present bilateral.   Musculoskeletal: Asymptomatic hammertoe pedal deformities noted bilateral. Muscular strength 4/5 in all lower extremity muscular groups bilateral without pain on range of motion.  History of GBS. No tenderness with calf compression bilateral.  Assessment and Plan: Problem List Items Addressed This Visit       Endocrine   Controlled type 2 diabetes mellitus with hyperglycemia, without long-term current use of insulin (HCC)     Nervous and Auditory   Polyneuropathy   Guillain Barr syndrome ([place])   Other Visit Diagnoses     Pain due to onychomycosis of toenails of both feet    -  Primary   PVD (peripheral vascular disease) ([place])           -Examined patient. -Discussed and educated patient on diabetic foot care, especially with  regards to the vascular, neurological and musculoskeletal systems.  -Mechanically debrided all nails 1-5 bilateral using sterile nail nipper and filed with dremel without incident  -Encourage elevation for edema control and advised patient that the discoloration that he sees is secondary to edema -Answered all patient questions -Patient to return  in 3 months for at risk foot care -Patient advised to call the office if any problems or questions arise in the meantime.  [name], DPM

## 2020-09-07 ENCOUNTER — Other Ambulatory Visit: Payer: Self-pay

## 2020-09-07 ENCOUNTER — Encounter: Payer: Self-pay | Admitting: Family Medicine

## 2020-09-07 ENCOUNTER — Ambulatory Visit (INDEPENDENT_AMBULATORY_CARE_PROVIDER_SITE_OTHER): Payer: Medicare HMO | Admitting: Family Medicine

## 2020-09-07 VITALS — BP 124/76 | HR 57 | Temp 98.1°F | Ht 73.0 in | Wt 254.5 lb

## 2020-09-07 DIAGNOSIS — I10 Essential (primary) hypertension: Secondary | ICD-10-CM

## 2020-09-07 DIAGNOSIS — E669 Obesity, unspecified: Secondary | ICD-10-CM | POA: Diagnosis not present

## 2020-09-07 DIAGNOSIS — E1169 Type 2 diabetes mellitus with other specified complication: Secondary | ICD-10-CM | POA: Diagnosis not present

## 2020-09-07 LAB — MICROALBUMIN / CREATININE URINE RATIO
Creatinine,U: 41.5 mg/dL
Microalb Creat Ratio: 1.7 mg/g (ref 0.0–30.0)
Microalb, Ur: <0.7 mg/dL (ref 0.0–1.9)

## 2020-09-07 LAB — HEMOGLOBIN A1C: Hgb A1c MFr Bld: 6.6 % — ABNORMAL HIGH (ref 4.6–6.5)

## 2020-09-07 MED ORDER — LISINOPRIL 10 MG PO TABS: 10.0000 mg | ORAL_TABLET | Freq: Every day | ORAL | 3 refills | Status: DC

## 2020-09-07 MED ORDER — VENLAFAXINE HCL ER 37.5 MG PO CP24: 37.5000 mg | ORAL_CAPSULE | Freq: Every day | ORAL | 2 refills | Status: DC

## 2020-09-07 NOTE — Progress Notes (Signed)
Subjective:   Chief Complaint  Patient presents with   Follow-up    Jon Townsend is a 65 y.o. male here for follow-up of diabetes.   Pt does not routinely check his sugars at home.  Patient does not require insulin.   Medications include: metformin XR 1000 mg/d Diet is fair.  Exercise: cardio, lifting wts He does have neuropathy. Taking gabapentin 300 mg tid.   Hypertension Patient presents for hypertension follow up. He does not routinely monitor home blood pressures. He is compliant with medications- Norvasc 5 mg/d, Toprol 50 mg bid. Patient has these side effects of medication: none No CP or SOB.   Past Medical History:  Diagnosis Date   Allergy    BPH (benign prostatic hyperplasia)    Diabetes mellitus type 2 in obese ([place])    Glaucoma    Hypertension      Related testing: Retinal exam: Due, appt scheduled on 09/16/20 Pneumovax: done  Objective:  BP 124/76   Pulse (!) 57   Temp 98.1 F (36.7 C) (Oral)   Ht '6\' 1"'$  (1.854 m)   Wt 254 lb 8 oz (115.4 kg)   SpO2 97%   BMI 33.58 kg/m  General:  Well developed, well nourished, in no apparent distress Skin:  Warm, no pallor or diaphoresis Head:  Normocephalic, atraumatic Eyes:  Pupils equal and round, sclera anicteric without injection  Lungs:  CTAB, no access msc use Cardio:  RRR, no bruits, no LE edema Musculoskeletal:  Symmetrical muscle groups noted without atrophy or deformity Neuro:  Sensation intact to pinprick on feet Psych: Age appropriate judgment and insight  Assessment:   Diabetes mellitus type 2 in obese (Jon Townsend) - Plan: Hemoglobin A1c, Microalbumin / creatinine urine ratio  Essential hypertension   Plan:   Chronic, stable.  Continue metformin XR 1000 mg daily.  Does not need to monitor sugars routinely.  Counseled on diet and exercise.  He has an eye exam scheduled.  Flu shot discussed but he does have a history of Guillain-Barr syndrome so we will hold off on getting for now. Chronic, stable.   Continue Norvasc 5 mg daily, change metoprolol 50 mg twice daily to lisinopril 10 mg daily for better renal protection.  Does not need to monitor blood pressures routinely. F/u in 6 mo for a physical or as needed. The patient voiced understanding and agreement to the plan.  [place], DO 09/07/20 11:02 AM

## 2020-09-07 NOTE — Patient Instructions (Signed)
Give us 2-3 business days to get the results of your labs back.   Keep the diet clean and stay active.  Let us know if you need anything. 

## 2020-09-21 ENCOUNTER — Ambulatory Visit: Payer: Medicare HMO | Admitting: Sports Medicine

## 2020-09-21 ENCOUNTER — Other Ambulatory Visit: Payer: Self-pay | Admitting: Physical Medicine and Rehabilitation

## 2020-09-23 MED ORDER — TAMSULOSIN HCL 0.4 MG PO CAPS: 0.8000 mg | ORAL_CAPSULE | Freq: Every day | ORAL | 1 refills | Status: DC

## 2020-10-01 ENCOUNTER — Ambulatory Visit (INDEPENDENT_AMBULATORY_CARE_PROVIDER_SITE_OTHER): Payer: Medicare HMO | Admitting: Sports Medicine

## 2020-10-01 ENCOUNTER — Other Ambulatory Visit: Payer: Self-pay

## 2020-10-01 ENCOUNTER — Encounter: Payer: Self-pay | Admitting: Sports Medicine

## 2020-10-01 DIAGNOSIS — M79675 Pain in left toe(s): Secondary | ICD-10-CM

## 2020-10-01 DIAGNOSIS — M79674 Pain in right toe(s): Secondary | ICD-10-CM

## 2020-10-01 DIAGNOSIS — E1165 Type 2 diabetes mellitus with hyperglycemia: Secondary | ICD-10-CM | POA: Diagnosis not present

## 2020-10-01 DIAGNOSIS — G629 Polyneuropathy, unspecified: Secondary | ICD-10-CM

## 2020-10-01 DIAGNOSIS — B351 Tinea unguium: Secondary | ICD-10-CM

## 2020-10-01 NOTE — Progress Notes (Signed)
Subjective: Jon Townsend is a 65 y.o. male patient with history of diabetes who presents to office today complaining of long,mildly painful nails  while ambulating in shoes; unable to trim. Patient states that the glucose reading this morning was not recorded but has been doing well and does not recall last A1c. No other issues noted.  Patient Active Problem List   Diagnosis Date Noted   Acute blood loss anemia    Bloody stool    Controlled type 2 diabetes mellitus with hyperglycemia, without long-term current use of insulin ([place])    Guillain Barr syndrome ([place]) 02/19/2020   Weakness 02/03/2020   Essential hypertension 02/03/2020   Polyneuropathy 02/03/2020   COVID-19 virus infection 02/03/2020   Diabetes mellitus type 2 in obese [facility])    BPH (benign prostatic hyperplasia)    Chronic right-sided low back pain without sciatica 04/03/2016   H/O urinary frequency 04/03/2016   Annual physical exam 02/03/2016   Low testosterone level in male 01/15/2015   ADD (attention deficit disorder) 01/14/2015   Class 1 obesity in adult 01/14/2015   Elevated BP without diagnosis of hypertension 01/14/2015   Lumbar paraspinal muscle spasm 01/14/2015   Rosacea 01/14/2015   Current Outpatient Medications on File Prior to Visit  Medication Sig Dispense Refill   amLODipine (NORVASC) 5 MG tablet Take 1 tablet (5 mg total) by mouth daily. 90 tablet 3   atorvastatin (LIPITOR) 40 MG tablet Take 1 tablet (40 mg total) by mouth daily. (Patient taking differently: Take 40 mg by mouth at bedtime.) 90 tablet 3   Coenzyme Q10 (COQ10) 100 MG CAPS Take 100 mg by mouth in the morning.     fexofenadine (ALLEGRA) 180 MG tablet Take 180 mg by mouth in the morning.     gabapentin (NEURONTIN) 300 MG capsule Take 1 capsule (300 mg total) by mouth 3 (three) times daily. 270 capsule 1   glucose blood (ONETOUCH VERIO) test strip Use as once daily to check blood sugar. 100 each 11   Krill Oil 1000 MG CAPS Take 1,000 mg by  mouth daily.     Lancets (ONETOUCH ULTRASOFT) lancets Use daily to check blood sugar. 100 each 3   latanoprost (XALATAN) 0.005 % ophthalmic solution Place 1 drop into both eyes at bedtime.     lisinopril (ZESTRIL) 10 MG tablet Take 1 tablet (10 mg total) by mouth daily. 90 tablet 3   magnesium oxide (MAG-OX) 400 (241.3 Mg) MG tablet Take 1 tablet (400 mg total) by mouth daily.     melatonin 10 MG TABS Take 10 mg by mouth at bedtime as needed (sleep).  0   metFORMIN (GLUCOPHAGE-XR) 500 MG 24 hr tablet Take 2 tablets (1,000 mg total) by mouth daily with breakfast. 180 tablet 3   tamsulosin (FLOMAX) 0.4 MG CAPS capsule Take 2 capsules (0.8 mg total) by mouth daily after supper. 90 capsule 1   venlafaxine XR (EFFEXOR XR) 37.5 MG 24 hr capsule Take 1 capsule (37.5 mg total) by mouth daily with breakfast. 30 capsule 2   vitamin C (ASCORBIC ACID) 500 MG tablet Take 500-1,000 mg by mouth daily.     No current facility-administered medications on file prior to visit.   Allergies  Allergen Reactions   Venlafaxine Other (See Comments)    Caused Insomnia      Objective: General: Patient is awake, alert, and oriented x 3 and in no acute distress.  Integument: Skin is warm, dry and supple bilateral. Nails are tender, long, thickened and  dystrophic with subungual debris, consistent with onychomycosis, 1-5 bilateral with partial lifting of right hallux nail history of microtrauma. No signs of infection. No open lesions or preulcerative lesions present bilateral. Remaining integument unremarkable.  Vasculature:  Dorsalis Pedis pulse 1/4 bilateral. Posterior Tibial pulse 1/4 bilateral. Capillary fill time <3 sec 1-5 bilateral. Positive hair growth to the level of the digits.Temperature gradient within normal limits.  Brawny pigmentation present bilateral.  Trace edema present bilateral.   Neurology: The patient has diminished sensation measured with a 5.07/10g Semmes Weinstein Monofilament at toes  bilateral.  Vibratory sensation diminished bilateral with tuning fork. No Babinski sign present bilateral.   Musculoskeletal: Asymptomatic hammertoe pedal deformities noted bilateral. Muscular strength 4/5 in all lower extremity muscular groups bilateral without pain on range of motion.  History of GBS. No tenderness with calf compression bilateral.  Assessment and Plan: Problem List Items Addressed This Visit       Endocrine   Controlled type 2 diabetes mellitus with hyperglycemia, without long-term current use of insulin (HCC)     Nervous and Auditory   Polyneuropathy   Other Visit Diagnoses     Pain due to onychomycosis of toenails of both feet    -  Primary       -Examined patient. -Discussed and educated patient on diabetic foot care, especially with  regards to the vascular, neurological and musculoskeletal systems.  -Mechanically debrided all nails 1-5 bilateral using sterile nail nipper and filed with dremel without incident  -Encourage elevation for edema control and advised patient that the discoloration that he sees is secondary to edema -Answered all patient questions -Patient to return  in 3 months for at risk foot care -Patient advised to call the office if any problems or questions arise in the meantime.  [name], DPM

## 2020-10-05 ENCOUNTER — Other Ambulatory Visit: Payer: Self-pay | Admitting: Family Medicine

## 2020-11-02 ENCOUNTER — Encounter: Payer: Self-pay | Admitting: Family Medicine

## 2020-11-02 DIAGNOSIS — Z0100 Encounter for examination of eyes and vision without abnormal findings: Secondary | ICD-10-CM | POA: Diagnosis not present

## 2020-11-02 DIAGNOSIS — E109 Type 1 diabetes mellitus without complications: Secondary | ICD-10-CM | POA: Diagnosis not present

## 2020-11-02 DIAGNOSIS — I10 Essential (primary) hypertension: Secondary | ICD-10-CM | POA: Diagnosis not present

## 2020-11-02 DIAGNOSIS — H40009 Preglaucoma, unspecified, unspecified eye: Secondary | ICD-10-CM | POA: Diagnosis not present

## 2020-11-02 LAB — HM DIABETES EYE EXAM

## 2020-11-09 DIAGNOSIS — D2239 Melanocytic nevi of other parts of face: Secondary | ICD-10-CM | POA: Diagnosis not present

## 2020-11-09 DIAGNOSIS — L820 Inflamed seborrheic keratosis: Secondary | ICD-10-CM | POA: Diagnosis not present

## 2020-11-09 DIAGNOSIS — L821 Other seborrheic keratosis: Secondary | ICD-10-CM | POA: Diagnosis not present

## 2020-11-09 DIAGNOSIS — D485 Neoplasm of uncertain behavior of skin: Secondary | ICD-10-CM | POA: Diagnosis not present

## 2020-11-09 DIAGNOSIS — L814 Other melanin hyperpigmentation: Secondary | ICD-10-CM | POA: Diagnosis not present

## 2020-11-09 DIAGNOSIS — D225 Melanocytic nevi of trunk: Secondary | ICD-10-CM | POA: Diagnosis not present

## 2020-11-23 ENCOUNTER — Other Ambulatory Visit: Payer: Self-pay | Admitting: Family Medicine

## 2020-11-23 DIAGNOSIS — E1169 Type 2 diabetes mellitus with other specified complication: Secondary | ICD-10-CM

## 2020-11-25 ENCOUNTER — Other Ambulatory Visit: Payer: Self-pay | Admitting: Family Medicine

## 2020-11-25 DIAGNOSIS — E1169 Type 2 diabetes mellitus with other specified complication: Secondary | ICD-10-CM

## 2020-11-25 DIAGNOSIS — E669 Obesity, unspecified: Secondary | ICD-10-CM

## 2020-11-26 MED ORDER — ATORVASTATIN CALCIUM 40 MG PO TABS: 40.0000 mg | ORAL_TABLET | Freq: Every day | ORAL | 0 refills | Status: DC

## 2020-12-28 ENCOUNTER — Other Ambulatory Visit: Payer: Self-pay

## 2020-12-28 ENCOUNTER — Encounter: Payer: Self-pay | Admitting: Sports Medicine

## 2020-12-28 ENCOUNTER — Ambulatory Visit: Payer: Medicare HMO | Admitting: Sports Medicine

## 2020-12-28 DIAGNOSIS — M79674 Pain in right toe(s): Secondary | ICD-10-CM | POA: Diagnosis not present

## 2020-12-28 DIAGNOSIS — E1165 Type 2 diabetes mellitus with hyperglycemia: Secondary | ICD-10-CM

## 2020-12-28 DIAGNOSIS — G610 Guillain-Barre syndrome: Secondary | ICD-10-CM

## 2020-12-28 DIAGNOSIS — B351 Tinea unguium: Secondary | ICD-10-CM

## 2020-12-28 DIAGNOSIS — I739 Peripheral vascular disease, unspecified: Secondary | ICD-10-CM

## 2020-12-28 DIAGNOSIS — M79675 Pain in left toe(s): Secondary | ICD-10-CM

## 2020-12-28 DIAGNOSIS — G629 Polyneuropathy, unspecified: Secondary | ICD-10-CM

## 2020-12-28 NOTE — Progress Notes (Signed)
Subjective: Jon Townsend is a 65 y.o. male patient with history of diabetes who presents to office today complaining of long,mildly painful nails  while ambulating in shoes; unable to trim. Patient states that the glucose reading this morning was not recorded but has been doing well and does not recall last A1c but thinks it was around 5 something last pcp visit back in August. No other issues noted.  Patient Active Problem List   Diagnosis Date Noted   Acute blood loss anemia    Bloody stool    Controlled type 2 diabetes mellitus with hyperglycemia, without long-term current use of insulin ([place])    Guillain Barr syndrome ([place]) 02/19/2020   Weakness 02/03/2020   Essential hypertension 02/03/2020   Polyneuropathy 02/03/2020   COVID-19 virus infection 02/03/2020   Diabetes mellitus type 2 in obese [facility])    BPH (benign prostatic hyperplasia)    Chronic right-sided low back pain without sciatica 04/03/2016   H/O urinary frequency 04/03/2016   Annual physical exam 02/03/2016   Low testosterone level in male 01/15/2015   ADD (attention deficit disorder) 01/14/2015   Class 1 obesity in adult 01/14/2015   Elevated BP without diagnosis of hypertension 01/14/2015   Lumbar paraspinal muscle spasm 01/14/2015   Rosacea 01/14/2015   Current Outpatient Medications on File Prior to Visit  Medication Sig Dispense Refill   amLODipine (NORVASC) 5 MG tablet Take 1 tablet (5 mg total) by mouth daily. 90 tablet 3   atorvastatin (LIPITOR) 40 MG tablet Take 1 tablet (40 mg total) by mouth daily. 90 tablet 0   Coenzyme Q10 (COQ10) 100 MG CAPS Take 100 mg by mouth in the morning.     fexofenadine (ALLEGRA) 180 MG tablet Take 180 mg by mouth in the morning.     gabapentin (NEURONTIN) 300 MG capsule TAKE 1 CAPSULE BY MOUTH THREE TIMES DAILY 270 capsule 1   glucose blood (ONETOUCH VERIO) test strip Use as once daily to check blood sugar. 100 each 11   Krill Oil 1000 MG CAPS Take 1,000 mg by mouth daily.      Lancets (ONETOUCH ULTRASOFT) lancets Use daily to check blood sugar. 100 each 3   latanoprost (XALATAN) 0.005 % ophthalmic solution Place 1 drop into both eyes at bedtime.     lisinopril (ZESTRIL) 10 MG tablet Take 1 tablet (10 mg total) by mouth daily. 90 tablet 3   magnesium oxide (MAG-OX) 400 (241.3 Mg) MG tablet Take 1 tablet (400 mg total) by mouth daily.     melatonin 10 MG TABS Take 10 mg by mouth at bedtime as needed (sleep).  0   metFORMIN (GLUCOPHAGE-XR) 500 MG 24 hr tablet Take 2 tablets (1,000 mg total) by mouth daily with breakfast. 180 tablet 3   tamsulosin (FLOMAX) 0.4 MG CAPS capsule Take 2 capsules (0.8 mg total) by mouth daily after supper. 90 capsule 1   venlafaxine XR (EFFEXOR XR) 37.5 MG 24 hr capsule Take 1 capsule (37.5 mg total) by mouth daily with breakfast. 30 capsule 2   vitamin C (ASCORBIC ACID) 500 MG tablet Take 500-1,000 mg by mouth daily.     No current facility-administered medications on file prior to visit.   Allergies  Allergen Reactions   Venlafaxine Other (See Comments)    Caused Insomnia      Objective: General: Patient is awake, alert, and oriented x 3 and in no acute distress.  Integument: Skin is warm, dry and supple bilateral. Nails are tender, long, thickened and dystrophic  with subungual debris, consistent with onychomycosis, 1-5 bilateral with partial lifting of right hallux nail history of microtrauma. No signs of infection. No open lesions or preulcerative lesions present bilateral. Remaining integument unremarkable.  Vasculature:  Dorsalis Pedis pulse 1/4 bilateral. Posterior Tibial pulse 1/4 bilateral. Capillary fill time <3 sec 1-5 bilateral. Positive hair growth to the level of the digits.Temperature gradient within normal limits.  Brawny pigmentation present bilateral.  Trace edema present bilateral.   Neurology: Gross sensation present via light touch, subjective heavy feeling to both feet at toes at night  Musculoskeletal:  Asymptomatic hammertoe pedal deformities noted bilateral. Muscular strength 4/5 in all lower extremity muscular groups bilateral without pain on range of motion.  History of GBS. No tenderness with calf compression bilateral.  Assessment and Plan: Problem List Items Addressed This Visit       Endocrine   Controlled type 2 diabetes mellitus with hyperglycemia, without long-term current use of insulin (HCC)     Nervous and Auditory   Polyneuropathy   Guillain Barr syndrome ([place])   Other Visit Diagnoses     Pain due to onychomycosis of toenails of both feet    -  Primary   PVD (peripheral vascular disease) ([place])           -Examined patient. -Discussed and educated patient on diabetic foot care, especially with  regards to the vascular, neurological and musculoskeletal systems.  -Continue with Gabapentin as Rx'd by PCP for neuropathy which could be related to DM or GBS -Mechanically debrided all nails 1-5 bilateral using sterile nail nipper and filed with dremel  -Answered all patient questions -Patient to return  in 3 months for at risk foot care -Patient advised to call the office if any problems or questions arise in the meantime.  [name], DPM

## 2021-02-23 ENCOUNTER — Encounter: Payer: Self-pay | Admitting: Family Medicine

## 2021-03-08 ENCOUNTER — Encounter: Payer: Self-pay | Admitting: Family Medicine

## 2021-03-08 ENCOUNTER — Ambulatory Visit (INDEPENDENT_AMBULATORY_CARE_PROVIDER_SITE_OTHER): Payer: Medicare HMO | Admitting: Family Medicine

## 2021-03-08 ENCOUNTER — Other Ambulatory Visit: Payer: Self-pay | Admitting: Family Medicine

## 2021-03-08 VITALS — BP 120/72 | HR 66 | Temp 97.9°F | Ht 74.0 in | Wt 258.0 lb

## 2021-03-08 DIAGNOSIS — E1169 Type 2 diabetes mellitus with other specified complication: Secondary | ICD-10-CM | POA: Diagnosis not present

## 2021-03-08 DIAGNOSIS — Z0000 Encounter for general adult medical examination without abnormal findings: Secondary | ICD-10-CM | POA: Diagnosis not present

## 2021-03-08 DIAGNOSIS — E669 Obesity, unspecified: Secondary | ICD-10-CM | POA: Diagnosis not present

## 2021-03-08 DIAGNOSIS — I739 Peripheral vascular disease, unspecified: Secondary | ICD-10-CM | POA: Diagnosis not present

## 2021-03-08 DIAGNOSIS — E875 Hyperkalemia: Secondary | ICD-10-CM

## 2021-03-08 LAB — COMPREHENSIVE METABOLIC PANEL
ALT: 18 U/L (ref 0–53)
AST: 17 U/L (ref 0–37)
Albumin: 4.4 g/dL (ref 3.5–5.2)
Alkaline Phosphatase: 89 U/L (ref 39–117)
BUN: 17 mg/dL (ref 6–23)
CO2: 32 mEq/L (ref 19–32)
Calcium: 9.6 mg/dL (ref 8.4–10.5)
Chloride: 102 mEq/L (ref 96–112)
Creatinine, Ser: 1.01 mg/dL (ref 0.40–1.50)
GFR: 77.06 mL/min (ref >60.00)
Glucose, Bld: 127 mg/dL — ABNORMAL HIGH (ref 70–99)
Potassium: 5.3 mEq/L — ABNORMAL HIGH (ref 3.5–5.1)
Sodium: 139 mEq/L (ref 135–145)
Total Bilirubin: 1.2 mg/dL (ref 0.2–1.2)
Total Protein: 6.8 g/dL (ref 6.0–8.3)

## 2021-03-08 LAB — CBC
HCT: 46.6 % (ref 39.0–52.0)
Hemoglobin: 15.4 g/dL (ref 13.0–17.0)
MCHC: 33.1 g/dL (ref 30.0–36.0)
MCV: 93.3 fl (ref 78.0–100.0)
Platelets: 179 10*3/uL (ref 150.0–400.0)
RBC: 5 Mil/uL (ref 4.22–5.81)
RDW: 13.2 % (ref 11.5–15.5)
WBC: 5.2 10*3/uL (ref 4.0–10.5)

## 2021-03-08 LAB — LIPID PANEL
Cholesterol: 100 mg/dL (ref 0–200)
HDL: 35.4 mg/dL — ABNORMAL LOW (ref >39.00)
LDL Cholesterol: 48 mg/dL (ref 0–99)
NonHDL: 64.97
Total CHOL/HDL Ratio: 3
Triglycerides: 87 mg/dL (ref 0.0–149.0)
VLDL: 17.4 mg/dL (ref 0.0–40.0)

## 2021-03-08 LAB — HEMOGLOBIN A1C: Hgb A1c MFr Bld: 6.9 % — ABNORMAL HIGH (ref 4.6–6.5)

## 2021-03-08 MED ORDER — TAMSULOSIN HCL 0.4 MG PO CAPS: 0.8000 mg | ORAL_CAPSULE | Freq: Every day | ORAL | 2 refills | Status: DC

## 2021-03-08 MED ORDER — OZEMPIC (0.25 OR 0.5 MG/DOSE) 2 MG/1.5ML ~~LOC~~ SOPN: PEN_INJECTOR | SUBCUTANEOUS | 2 refills | Status: AC

## 2021-03-08 MED ORDER — METFORMIN HCL ER 500 MG PO TB24: 1000.0000 mg | ORAL_TABLET | Freq: Every day | ORAL | 3 refills | Status: DC

## 2021-03-08 NOTE — Patient Instructions (Addendum)
Give [place] 2-3 business days to get the results of your labs back.   Keep the diet clean and stay active.  Please get me a copy of your advanced directive form at your convenience.   Don't fill the medication if too expensive.   Let us know if you need anything.

## 2021-03-08 NOTE — Progress Notes (Signed)
Chief Complaint  Patient presents with   Annual Exam    [name] is here for a complete physical.   His last physical was >1 year ago.  Current diet: in general, a "so-so" diet.   Current exercise: cycling, lifting weights, walking Weight trend: increased a little Fatigue out of ordinary? No. Seat belt? Yes.   Advanced directive? Yes  Health maintenance Shingrix- Yes Colonoscopy- Yes Tetanus- Yes Hep C- Yes Pneumonia vaccine- Due for PCV20  DM- sugars in low-mid 100's. Has gained wt. Diet/exercise as above. Compliant w Metformin 1000 mg bid. Interested in a weekly injectable to help with weight loss and sugars.   Past Medical History:  Diagnosis Date   Allergy    BPH (benign prostatic hyperplasia)    Diabetes mellitus type 2 in obese (HCC)    Glaucoma    Hypertension      Past Surgical History:  Procedure Laterality Date   Broken finger     TONSILLECTOMY      Medications  Current Outpatient Medications on File Prior to Visit  Medication Sig Dispense Refill   amLODipine (NORVASC) 5 MG tablet Take 1 tablet (5 mg total) by mouth daily. 90 tablet 3   atorvastatin (LIPITOR) 40 MG tablet Take 1 tablet (40 mg total) by mouth daily. 90 tablet 0   Coenzyme Q10 (COQ10) 100 MG CAPS Take 100 mg by mouth in the morning.     fexofenadine (ALLEGRA) 180 MG tablet Take 180 mg by mouth in the morning.     gabapentin (NEURONTIN) 300 MG capsule TAKE 1 CAPSULE BY MOUTH THREE TIMES DAILY 270 capsule 1   glucose blood (ONETOUCH VERIO) test strip Use as once daily to check blood sugar. 100 each 11   Krill Oil 1000 MG CAPS Take 1,000 mg by mouth daily.     Lancets (ONETOUCH ULTRASOFT) lancets Use daily to check blood sugar. 100 each 3   latanoprost (XALATAN) 0.005 % ophthalmic solution Place 1 drop into both eyes at bedtime.     lisinopril (ZESTRIL) 10 MG tablet Take 1 tablet (10 mg total) by mouth daily. 90 tablet 3   magnesium oxide (MAG-OX) 400 (241.3 Mg) MG tablet Take  1 tablet (400 mg total) by mouth daily.     metFORMIN (GLUCOPHAGE-XR) 500 MG 24 hr tablet Take 2 tablets (1,000 mg total) by mouth daily with breakfast. 180 tablet 3   tamsulosin (FLOMAX) 0.4 MG CAPS capsule Take 2 capsules (0.8 mg total) by mouth daily after supper. 90 capsule 1   vitamin C (ASCORBIC ACID) 500 MG tablet Take 500-1,000 mg by mouth daily.     Allergies Allergies  Allergen Reactions   Venlafaxine Other (See Comments)    Caused Insomnia    Family History Family History  Problem Relation Age of Onset   Cancer Father    Early death Father    Diabetes Sister    Heart disease Sister    COPD Sister    Diabetes Sister    Colon cancer Neg Hx    Esophageal cancer Neg Hx    Stomach cancer Neg Hx    Rectal cancer Neg Hx     Review of Systems: Constitutional:  no fevers Eye:  no recent significant change in vision Ears:  No changes in hearing Nose/Mouth/Throat:  no complaints of nasal congestion, no sore throat Cardiovascular: no chest pain Respiratory:  No shortness of breath Gastrointestinal:  No change in bowel habits GU:  No frequency Integumentary:  no  abnormal skin lesions reported Neurologic:  no headaches Endocrine:  denies unexplained weight changes  Exam BP 120/72    Pulse 66    Temp 97.9 F (36.6 C) (Oral)    Ht 6\' 2"  (1.88 m)    Wt 258 lb (117 kg)    SpO2 99%    BMI 33.13 kg/m  General:  well developed, well nourished, in no apparent distress Skin:  no significant moles, warts, or growths Head:  no masses, lesions, or tenderness Eyes:  pupils equal and round, sclera anicteric without injection Ears:  canals without lesions, TMs shiny without retraction, no obvious effusion, no erythema Nose:  nares patent, septum midline, mucosa normal Throat/Pharynx:  lips and gingiva without lesion; tongue and uvula midline; non-inflamed pharynx; no exudates or postnasal drainage Lungs:  clear to auscultation, breath sounds equal bilaterally, no respiratory  distress Cardio:  regular rate and rhythm, no LE edema or bruits Rectal: Deferred GI: BS+, S, NT, ND, no masses or organomegaly Musculoskeletal:  symmetrical muscle groups noted without atrophy or deformity Neuro:  gait normal; deep tendon reflexes normal and symmetric Psych: well oriented with normal range of affect and appropriate judgment/insight  Assessment and Plan  Well adult exam  Diabetes mellitus type 2 in obese ([place]) - Plan: CBC, Comprehensive metabolic panel, Hemoglobin A1c, Lipid panel, Semaglutide,0.25 or 0.5MG /DOS, (OZEMPIC, 0.25 OR 0.5 MG/DOSE,) 2 MG/1.5ML SOPN  PVD (peripheral vascular disease) ([place])   Well 66 y.o. male. Counseled on diet and exercise. Other orders as above. Requested advanced directive copy.  Will research PCV20.  DM: Chronic, unsure if stable. Cont metformin 1000 mg bid, add Ozempic 0.25 mg/d for 4 doses and then increase to 0.5 mg weekly.  Obesity: Chronic, not controlled. Ozempic as above. He will let me know if there are cost issues.  Follow up in 6 mo or prn.  The patient voiced understanding and agreement to the plan.  [place], DO 03/08/21 11:19 AM

## 2021-03-10 ENCOUNTER — Encounter: Payer: Self-pay | Admitting: Family Medicine

## 2021-03-11 ENCOUNTER — Other Ambulatory Visit (INDEPENDENT_AMBULATORY_CARE_PROVIDER_SITE_OTHER): Payer: Medicare HMO

## 2021-03-11 DIAGNOSIS — E875 Hyperkalemia: Secondary | ICD-10-CM | POA: Diagnosis not present

## 2021-03-11 LAB — BASIC METABOLIC PANEL
BUN: 20 mg/dL (ref 6–23)
CO2: 32 mEq/L (ref 19–32)
Calcium: 9.4 mg/dL (ref 8.4–10.5)
Chloride: 102 mEq/L (ref 96–112)
Creatinine, Ser: 1.03 mg/dL (ref 0.40–1.50)
GFR: 75.27 mL/min (ref >60.00)
Glucose, Bld: 87 mg/dL (ref 70–99)
Potassium: 5.3 mEq/L — ABNORMAL HIGH (ref 3.5–5.1)
Sodium: 138 mEq/L (ref 135–145)

## 2021-03-14 ENCOUNTER — Other Ambulatory Visit: Payer: Self-pay | Admitting: Family Medicine

## 2021-03-14 DIAGNOSIS — E875 Hyperkalemia: Secondary | ICD-10-CM

## 2021-03-14 MED ORDER — LISINOPRIL-HYDROCHLOROTHIAZIDE 10-12.5 MG PO TABS: 1.0000 | ORAL_TABLET | Freq: Every day | ORAL | 3 refills | Status: DC

## 2021-03-15 ENCOUNTER — Encounter: Payer: Self-pay | Admitting: Family Medicine

## 2021-03-22 ENCOUNTER — Other Ambulatory Visit (INDEPENDENT_AMBULATORY_CARE_PROVIDER_SITE_OTHER): Payer: Medicare HMO

## 2021-03-22 DIAGNOSIS — E875 Hyperkalemia: Secondary | ICD-10-CM | POA: Diagnosis not present

## 2021-03-22 LAB — BASIC METABOLIC PANEL
BUN: 22 mg/dL (ref 6–23)
CO2: 33 mEq/L — ABNORMAL HIGH (ref 19–32)
Calcium: 9.4 mg/dL (ref 8.4–10.5)
Chloride: 98 mEq/L (ref 96–112)
Creatinine, Ser: 0.98 mg/dL (ref 0.40–1.50)
GFR: 79.88 mL/min (ref >60.00)
Glucose, Bld: 131 mg/dL — ABNORMAL HIGH (ref 70–99)
Potassium: 4.5 mEq/L (ref 3.5–5.1)
Sodium: 136 mEq/L (ref 135–145)

## 2021-03-22 NOTE — Telephone Encounter (Signed)
Pt dropped off [employer] patient Chiropractor for [employer]. States the medication will be mailed to our office if PCP agrees. Form placed on CMA desk for completion / PCP signature. Please contact patient once form is completed and ready to be picked up. ?

## 2021-03-29 ENCOUNTER — Other Ambulatory Visit: Payer: Self-pay

## 2021-03-29 ENCOUNTER — Encounter: Payer: Self-pay | Admitting: Sports Medicine

## 2021-03-29 ENCOUNTER — Ambulatory Visit (INDEPENDENT_AMBULATORY_CARE_PROVIDER_SITE_OTHER): Payer: Medicare HMO | Admitting: Sports Medicine

## 2021-03-29 DIAGNOSIS — E1165 Type 2 diabetes mellitus with hyperglycemia: Secondary | ICD-10-CM

## 2021-03-29 DIAGNOSIS — G610 Guillain-Barre syndrome: Secondary | ICD-10-CM

## 2021-03-29 DIAGNOSIS — B351 Tinea unguium: Secondary | ICD-10-CM

## 2021-03-29 DIAGNOSIS — M79674 Pain in right toe(s): Secondary | ICD-10-CM

## 2021-03-29 DIAGNOSIS — I739 Peripheral vascular disease, unspecified: Secondary | ICD-10-CM

## 2021-03-29 DIAGNOSIS — M79675 Pain in left toe(s): Secondary | ICD-10-CM | POA: Diagnosis not present

## 2021-03-29 DIAGNOSIS — G629 Polyneuropathy, unspecified: Secondary | ICD-10-CM

## 2021-03-29 NOTE — Progress Notes (Signed)
Subjective: ?Jon Townsend is a 66 y.o. male patient with history of diabetes who presents to office today complaining of long,mildly painful nails  while ambulating in shoes; unable to trim. Patient states that the glucose reading this morning was 113 and last A1c 6.1 states that he saw his PCP Jon Pal, DO 2 weeks ago.  No other issues noted. ? ? ?Patient Active Problem List  ? Diagnosis Date Noted  ? PVD (peripheral vascular disease) ([place]) 03/08/2021  ? Acute blood loss anemia   ? Bloody stool   ? Controlled type 2 diabetes mellitus with hyperglycemia, without long-term current use of insulin ([place])   ? Weakness 02/03/2020  ? Essential hypertension 02/03/2020  ? Polyneuropathy 02/03/2020  ? COVID-19 virus infection 02/03/2020  ? Diabetes mellitus type 2 in obese [facility])   ? BPH (benign prostatic hyperplasia)   ? Chronic right-sided low back pain without sciatica 04/03/2016  ? H/O urinary frequency 04/03/2016  ? Annual physical exam 02/03/2016  ? Low testosterone level in male 01/15/2015  ? ADD (attention deficit disorder) 01/14/2015  ? Class 1 obesity in adult 01/14/2015  ? Elevated BP without diagnosis of hypertension 01/14/2015  ? Lumbar paraspinal muscle spasm 01/14/2015  ? Rosacea 01/14/2015  ? ?Current Outpatient Medications on File Prior to Visit  ?Medication Sig Dispense Refill  ? atorvastatin (LIPITOR) 40 MG tablet Take 1 tablet (40 mg total) by mouth daily. 90 tablet 0  ? Coenzyme Q10 (COQ10) 100 MG CAPS Take 100 mg by mouth in the morning.    ? fexofenadine (ALLEGRA) 180 MG tablet Take 180 mg by mouth in the morning.    ? gabapentin (NEURONTIN) 300 MG capsule TAKE 1 CAPSULE BY MOUTH THREE TIMES DAILY 270 capsule 1  ? glucose blood (ONETOUCH VERIO) test strip Use as once daily to check blood sugar. 100 each 11  ? Krill Oil 1000 MG CAPS Take 1,000 mg by mouth daily.    ? Lancets (ONETOUCH ULTRASOFT) lancets Use daily to check blood sugar. 100 each 3  ? latanoprost (XALATAN) 0.005 %  ophthalmic solution Place 1 drop into both eyes at bedtime.    ? lisinopril-hydrochlorothiazide (ZESTORETIC) 10-12.5 MG tablet Take 1 tablet by mouth daily. 30 tablet 3  ? magnesium oxide (MAG-OX) 400 (241.3 Mg) MG tablet Take 1 tablet (400 mg total) by mouth daily.    ? metFORMIN (GLUCOPHAGE-XR) 500 MG 24 hr tablet Take 2 tablets (1,000 mg total) by mouth daily with breakfast. 180 tablet 3  ? Semaglutide,0.25 or 0.'5MG'$ /DOS, (OZEMPIC, 0.25 OR 0.5 MG/DOSE,) 2 MG/1.5ML SOPN Inject 0.25 mg into the skin once a week for 28 days, THEN 0.5 mg once a week for 28 days. 4.5 mL 2  ? tamsulosin (FLOMAX) 0.4 MG CAPS capsule Take 2 capsules (0.8 mg total) by mouth daily after supper. 90 capsule 2  ? vitamin C (ASCORBIC ACID) 500 MG tablet Take 500-1,000 mg by mouth daily.    ? ?No current facility-administered medications on file prior to visit.  ? ?Allergies  ?Allergen Reactions  ? Venlafaxine Other (See Comments)  ?  Caused Insomnia  ? ? ? ? ?Objective: ?General: Patient is awake, alert, and oriented x 3 and in no acute distress. ? ?Integument: Skin is warm, dry and supple bilateral. Nails are tender, long, thickened and dystrophic with subungual debris, consistent with onychomycosis, 1-5 bilateral with partial lifting of right hallux nail history of microtrauma. No signs of infection. No open lesions or preulcerative lesions present bilateral. Remaining integument unremarkable. ? ?  Vasculature:  Dorsalis Pedis pulse 1/4 bilateral. Posterior Tibial pulse 1/4 bilateral. Capillary fill time <3 sec 1-5 bilateral. Positive hair growth to the level of the digits.Temperature gradient within normal limits.  Brawny pigmentation present bilateral.  Trace edema present bilateral.  ? ?Neurology: Gross sensation present via light touch, subjective heavy feeling to both feet at toes at night ? ?Musculoskeletal: Asymptomatic hammertoe pedal deformities noted bilateral. Muscular strength 4/5 in all lower extremity muscular groups bilateral  without pain on range of motion.  History of GBS. No tenderness with calf compression bilateral. ? ?Assessment and Plan: ?Problem List Items Addressed This Visit   ? ?  ? Cardiovascular and Mediastinum  ? PVD (peripheral vascular disease) (Jon Townsend)  ?  ? Endocrine  ? Controlled type 2 diabetes mellitus with hyperglycemia, without long-term current use of insulin (Jon Townsend)  ?  ? Nervous and Auditory  ? Polyneuropathy  ? ?Other Visit Diagnoses   ? ? Pain due to onychomycosis of toenails of both feet    -  Primary  ? Guillain Barr? syndrome (Jon Townsend)      ? ?  ? ? ?-Examined patient. ?-Discussed continued care of feet in the setting of diabetes with Guillain-Barr? syndrome and neuropathy ?-Mechanically debrided all painful nails 1-5 bilateral using sterile nail nipper and filed with dremel  ?-Answered all patient questions ?-Patient to return  in 3 months for at risk foot care ?-Patient advised to call the office if any problems or questions arise in the meantime. ? ?Jon Townsend, DPM ? ? ?

## 2021-04-15 ENCOUNTER — Encounter: Payer: Self-pay | Admitting: Family Medicine

## 2021-04-21 ENCOUNTER — Telehealth: Payer: Self-pay | Admitting: *Deleted

## 2021-04-21 NOTE — Telephone Encounter (Signed)
Patient wanted to know if Ozempic came in.  Advised that if it is through the [employer] then it is taking longer than usual to come in.  Advised patient to call to get voucher to take to pharmacy since he was about out. ?

## 2021-04-22 ENCOUNTER — Ambulatory Visit (INDEPENDENT_AMBULATORY_CARE_PROVIDER_SITE_OTHER): Payer: Medicare HMO

## 2021-04-22 VITALS — Ht 74.0 in | Wt 250.0 lb

## 2021-04-22 DIAGNOSIS — Z0000 Encounter for general adult medical examination without abnormal findings: Secondary | ICD-10-CM

## 2021-04-22 NOTE — Telephone Encounter (Signed)
Received Patient assistance today 04/22/2021. ?Customer #[id number] ?Ozempic 0.25 mg prefilled pens. ?#5 boxes. ?Lot# [id number]  Expiration date 09/09/2022 ?From [employer] ?Called the patient and he will pickup today at our office. ?Medication is in the Frig in nurses station. ?

## 2021-04-22 NOTE — Progress Notes (Signed)
? ?Subjective:  ? Jon Townsend is a 66 y.o. male who presents for an Initial Medicare Annual Wellness Visit. ?Virtual Visit via Telephone Note ? ?I connected with  [name] on 04/22/21 at  2:00 PM EDT by telephone and verified that I am speaking with the correct person using two identifiers. ? ?Location: ?Patient: HOME ?Provider: [facility] ?Persons participating in the virtual visit: patient/Nurse Health Advisor ?  ?I discussed the limitations, risks, security and privacy concerns of performing an evaluation and management service by telephone and the availability of in person appointments. The patient expressed understanding and agreed to proceed. ? ?Interactive audio and video telecommunications were attempted between this nurse and patient, however failed, due to patient having technical difficulties OR patient did not have access to video capability.  We continued and completed visit with audio only. ? ?Some vital signs may be absent or patient reported.  ? ?Chriss Driver, LPN ? ?Review of Systems    ? ?Cardiac Risk Factors include: advanced age (>14mn, >>46women);hypertension;diabetes mellitus;male gender;obesity (BMI >30kg/m2) ? ?   ?Objective:  ?  ?Today's Vitals  ? 04/22/21 1351  ?Weight: 250 lb (113.4 kg)  ?Height: '6\' 2"'$  (1.88 m)  ? ?Body mass index is 32.1 kg/m?. ? ? ?  04/22/2021  ?  1:58 PM 02/19/2020  ?  2:53 PM 02/03/2020  ? 12:30 PM  ?Advanced Directives  ?Does Patient Have a Medical Advance Directive? No No No  ?Would patient like information on creating a medical advance directive? No - Patient declined No - Patient declined No - Patient declined  ? ? ?Current Medications (verified) ?Outpatient Encounter Medications as of 04/22/2021  ?Medication Sig  ? atorvastatin (LIPITOR) 40 MG tablet Take 1 tablet (40 mg total) by mouth daily.  ? Coenzyme Q10 (COQ10) 100 MG CAPS Take 100 mg by mouth in the morning.  ? fexofenadine (ALLEGRA) 180 MG tablet Take 180 mg by mouth in the morning.  ?  gabapentin (NEURONTIN) 300 MG capsule TAKE 1 CAPSULE BY MOUTH THREE TIMES DAILY  ? glucose blood (ONETOUCH VERIO) test strip Use as once daily to check blood sugar.  ? Krill Oil 1000 MG CAPS Take 1,000 mg by mouth daily.  ? Lancets (ONETOUCH ULTRASOFT) lancets Use daily to check blood sugar.  ? latanoprost (XALATAN) 0.005 % ophthalmic solution Place 1 drop into both eyes at bedtime.  ? lisinopril-hydrochlorothiazide (ZESTORETIC) 10-12.5 MG tablet Take 1 tablet by mouth daily.  ? magnesium oxide (MAG-OX) 400 (241.3 Mg) MG tablet Take 1 tablet (400 mg total) by mouth daily.  ? metFORMIN (GLUCOPHAGE-XR) 500 MG 24 hr tablet Take 2 tablets (1,000 mg total) by mouth daily with breakfast.  ? Semaglutide,0.25 or 0.'5MG'$ /DOS, (OZEMPIC, 0.25 OR 0.5 MG/DOSE,) 2 MG/1.5ML SOPN Inject 0.25 mg into the skin once a week for 28 days, THEN 0.5 mg once a week for 28 days.  ? tamsulosin (FLOMAX) 0.4 MG CAPS capsule Take 2 capsules (0.8 mg total) by mouth daily after supper.  ? vitamin C (ASCORBIC ACID) 500 MG tablet Take 500-1,000 mg by mouth daily.  ? ?No facility-administered encounter medications on file as of 04/22/2021.  ? ? ?Allergies (verified) ?Venlafaxine  ? ?History: ?Past Medical History:  ?Diagnosis Date  ? Allergy   ? BPH (benign prostatic hyperplasia)   ? Diabetes mellitus type 2 in obese ([facility]   ? Glaucoma   ? Hypertension   ? ?Past Surgical History:  ?Procedure Laterality Date  ? Broken finger    ?  TONSILLECTOMY    ? ?Family History  ?Problem Relation Age of Onset  ? Cancer Father   ? Early death Father   ? Diabetes Sister   ? Heart disease Sister   ? COPD Sister   ? Diabetes Sister   ? Colon cancer Neg Hx   ? Esophageal cancer Neg Hx   ? Stomach cancer Neg Hx   ? Rectal cancer Neg Hx   ? ?Social History  ? ?Socioeconomic History  ? Marital status: Married  ?  Spouse name: Helene Kelp  ? Number of children: 1  ? Years of education: Not on file  ? Highest education level: Not on file  ?Occupational History  ? Not on file   ?Tobacco Use  ? Smoking status: Former  ? Smokeless tobacco: Never  ? Tobacco comments:  ?  Quit 35 years ago  ?Substance and Sexual Activity  ? Alcohol use: Never  ? Drug use: Never  ? Sexual activity: Not on file  ?Other Topics Concern  ? Not on file  ?Social History Narrative  ? Lives in [place] with wife, Helene Kelp.  ? 1 daughter, 1 grandson  ? ?Social Determinants of Health  ? ?Financial Resource Strain: Low Risk   ? Difficulty of Paying Living Expenses: Not hard at all  ?Food Insecurity: No Food Insecurity  ? Worried About Charity fundraiser in the Last Year: Never true  ? Ran Out of Food in the Last Year: Never true  ?Transportation Needs: No Transportation Needs  ? Lack of Transportation (Medical): No  ? Lack of Transportation (Non-Medical): No  ?Physical Activity: Sufficiently Active  ? Days of Exercise per Week: 3 days  ? Minutes of Exercise per Session: 60 min  ?Stress: No Stress Concern Present  ? Feeling of Stress : Not at all  ?Social Connections: Socially Integrated  ? Frequency of Communication with Friends and Family: More than three times a week  ? Frequency of Social Gatherings with Friends and Family: More than three times a week  ? Attends Religious Services: 1 to 4 times per year  ? Active Member of Clubs or Organizations: Yes  ? Attends Archivist Meetings: More than 4 times per year  ? Marital Status: Married  ? ? ?Tobacco Counseling ?Counseling given: Not Answered ?Tobacco comments: Quit 35 years ago ? ? ?Clinical Intake: ? ?Pre-visit preparation completed: Yes ? ?Pain : No/denies pain ? ?  ? ?BMI - recorded: 32.1 ?Nutritional Status: BMI > 30  Obese ?Nutritional Risks: None ?Diabetes: Yes ? ?How often do you need to have someone help you when you read instructions, pamphlets, or other written materials from your doctor or pharmacy?: 1 - Never ? ?Diabetic?Nutrition Risk Assessment: ? ?Has the patient had any N/V/D within the last 2 months?  Yes  ?Does the patient have any  non-healing wounds?  No  ?Has the patient had any unintentional weight loss or weight gain?  No  ? ?Diabetes: ? ?Is the patient diabetic?  Yes  ?If diabetic, was a CBG obtained today?  No  ?Did the patient bring in their glucometer from home?  No  Phone visit.  ?How often do you monitor your CBG's? Every 3 days.  ? ?Financial Strains and Diabetes Management: ? ?Are you having any financial strains with the device, your supplies or your medication? No .  ?Does the patient want to be seen by Chronic Care Management for management of their diabetes?  No  ?Would the patient like to  be referred to a Nutritionist or for Diabetic Management?  No  ? ?Diabetic Exams: ? ?Diabetic Eye Exam: Completed 12/2020. Pt has been advised about the importance in completing this exam. A referral has been placed today. Message sent to referral coordinator for scheduling purposes. Advised pt to expect a call from office referred to regarding appt. ? ?Diabetic Foot Exam: Completed 09/07/2020. Pt has been advised about the importance in completing this exam.  ? ?Interpreter Needed?: No ? ?Information entered by :: MJ Kourtnee Lahey, LPN ? ? ?Activities of Daily Living ? ?  04/22/2021  ?  2:02 PM  ?In your present state of health, do you have any difficulty performing the following activities:  ?Hearing? 0  ?Vision? 0  ?Difficulty concentrating or making decisions? 1  ?Comment At times  ?Walking or climbing stairs? 0  ?Dressing or bathing? 0  ?Doing errands, shopping? 0  ?Preparing Food and eating ? N  ?Using the Toilet? N  ?In the past six months, have you accidently leaked urine? N  ?Do you have problems with loss of bowel control? N  ?Managing your Medications? N  ?Managing your Finances? N  ?Housekeeping or managing your Housekeeping? N  ? ? ?Patient Care Team: ?[name], DO as PCP - General (Family Medicine) ? ?Indicate any recent Medical Services you may have received from other than [facility] providers in the past year (date may be  approximate). ? ?   ?Assessment:  ? This is a routine wellness examination for [facility]. ? ?Hearing/Vision screen ?Hearing Screening - Comments:: No hearing issues.  ?Vision Screening - Comments:: Glasses. Dr. Mendel Ryder in [place]. 2

## 2021-04-22 NOTE — Patient Instructions (Signed)
Jon Townsend , ?Thank you for taking time to come for your Medicare Wellness Visit. I appreciate your ongoing commitment to your health goals. Please review the following plan we discussed and let me know if I can assist you in the future.  ? ?Screening recommendations/referrals: ?Colonoscopy: Done 02/13/2018 Repeat in 5 years ? ?Recommended yearly ophthalmology/optometry visit for glaucoma screening and checkup ?Recommended yearly dental visit for hygiene and checkup ? ?Vaccinations: ?Influenza vaccine: Declined due to GBS. ?Pneumococcal vaccine: Done 01/23/2018. ?Tdap vaccine: Unable to find documentation in chart or [facility]. If you have documentation at home, please bring copy to office. Thank you! ? ?Shingles vaccine: Done 04/26/2017 and 07/04/2017   ?Covid-19: Declined. ? ?Advanced directives: Please bring a copy of your health care power of attorney and living will to the office to be added to your chart at your convenience. ? ? ?Conditions/risks identified: KEEP UP THE GOOD WORK!! ? ?Next appointment: Follow up in one year for your annual wellness visit. 2024. ? ?Preventive Care 16 Years and Older, Male ? ?Preventive care refers to lifestyle choices and visits with your health care provider that can promote health and wellness. ?What does preventive care include? ?A yearly physical exam. This is also called an annual well check. ?Dental exams once or twice a year. ?Routine eye exams. Ask your health care provider how often you should have your eyes checked. ?Personal lifestyle choices, including: ?Daily care of your teeth and gums. ?Regular physical activity. ?Eating a healthy diet. ?Avoiding tobacco and drug use. ?Limiting alcohol use. ?Practicing safe sex. ?Taking low doses of aspirin every day. ?Taking vitamin and mineral supplements as recommended by your health care provider. ?What happens during an annual well check? ?The services and screenings done by your health care provider during your annual well check will  depend on your age, overall health, lifestyle risk factors, and family history of disease. ?Counseling  ?Your health care provider may ask you questions about your: ?Alcohol use. ?Tobacco use. ?Drug use. ?Emotional well-being. ?Home and relationship well-being. ?Sexual activity. ?Eating habits. ?History of falls. ?Memory and ability to understand (cognition). ?Work and work Statistician. ?Screening  ?You may have the following tests or measurements: ?Height, weight, and BMI. ?Blood pressure. ?Lipid and cholesterol levels. These may be checked every 5 years, or more frequently if you are over 74 years old. ?Skin check. ?Lung cancer screening. You may have this screening every year starting at age 57 if you have a 30-pack-year history of smoking and currently smoke or have quit within the past 15 years. ?Fecal occult blood test (FOBT) of the stool. You may have this test every year starting at age 38. ?Flexible sigmoidoscopy or colonoscopy. You may have a sigmoidoscopy every 5 years or a colonoscopy every 10 years starting at age 1. ?Prostate cancer screening. Recommendations will vary depending on your family history and other risks. ?Hepatitis C blood test. ?Hepatitis B blood test. ?Sexually transmitted disease (STD) testing. ?Diabetes screening. This is done by checking your blood sugar (glucose) after you have not eaten for a while (fasting). You may have this done every 1-3 years. ?Abdominal aortic aneurysm (AAA) screening. You may need this if you are a current or former smoker. ?Osteoporosis. You may be screened starting at age 81 if you are at high risk. ?Talk with your health care provider about your test results, treatment options, and if necessary, the need for more tests. ?Vaccines  ?Your health care provider may recommend certain vaccines, such as: ?Influenza vaccine. This  is recommended every year. ?Tetanus, diphtheria, and acellular pertussis (Tdap, Td) vaccine. You may need a Td booster every 10  years. ?Zoster vaccine. You may need this after age 69. ?Pneumococcal 13-valent conjugate (PCV13) vaccine. One dose is recommended after age 35. ?Pneumococcal polysaccharide (PPSV23) vaccine. One dose is recommended after age 77. ?Talk to your health care provider about which screenings and vaccines you need and how often you need them. ?This information is not intended to replace advice given to you by your health care provider. Make sure you discuss any questions you have with your health care provider. ?Document Released: 01/22/2015 Document Revised: 09/15/2015 Document Reviewed: 10/27/2014 ?Elsevier Interactive Patient Education ? 2017 [place]. ? ?Fall Prevention in the Home ?Falls can cause injuries. They can happen to people of all ages. There are many things you can do to make your home safe and to help prevent falls. ?What can I do on the outside of my home? ?Regularly fix the edges of walkways and driveways and fix any cracks. ?Remove anything that might make you trip as you walk through a door, such as a raised step or threshold. ?Trim any bushes or trees on the path to your home. ?Use bright outdoor lighting. ?Clear any walking paths of anything that might make someone trip, such as rocks or tools. ?Regularly check to see if handrails are loose or broken. Make sure that both sides of any steps have handrails. ?Any raised decks and porches should have guardrails on the edges. ?Have any leaves, snow, or ice cleared regularly. ?Use sand or salt on walking paths during winter. ?Clean up any spills in your garage right away. This includes oil or grease spills. ?What can I do in the bathroom? ?Use night lights. ?Install grab bars by the toilet and in the tub and shower. Do not use towel bars as grab bars. ?Use non-skid mats or decals in the tub or shower. ?If you need to sit down in the shower, use a plastic, non-slip stool. ?Keep the floor dry. Clean up any water that spills on the floor as soon as it  happens. ?Remove soap buildup in the tub or shower regularly. ?Attach bath mats securely with double-sided non-slip rug tape. ?Do not have throw rugs and other things on the floor that can make you trip. ?What can I do in the bedroom? ?Use night lights. ?Make sure that you have a light by your bed that is easy to reach. ?Do not use any sheets or blankets that are too big for your bed. They should not hang down onto the floor. ?Have a firm chair that has side arms. You can use this for support while you get dressed. ?Do not have throw rugs and other things on the floor that can make you trip. ?What can I do in the kitchen? ?Clean up any spills right away. ?Avoid walking on wet floors. ?Keep items that you use a lot in easy-to-reach places. ?If you need to reach something above you, use a strong step stool that has a grab bar. ?Keep electrical cords out of the way. ?Do not use floor polish or wax that makes floors slippery. If you must use wax, use non-skid floor wax. ?Do not have throw rugs and other things on the floor that can make you trip. ?What can I do with my stairs? ?Do not leave any items on the stairs. ?Make sure that there are handrails on both sides of the stairs and use them. Fix handrails that  are broken or loose. Make sure that handrails are as long as the stairways. ?Check any carpeting to make sure that it is firmly attached to the stairs. Fix any carpet that is loose or worn. ?Avoid having throw rugs at the top or bottom of the stairs. If you do have throw rugs, attach them to the floor with carpet tape. ?Make sure that you have a light switch at the top of the stairs and the bottom of the stairs. If you do not have them, ask someone to add them for you. ?What else can I do to help prevent falls? ?Wear shoes that: ?Do not have high heels. ?Have rubber bottoms. ?Are comfortable and fit you well. ?Are closed at the toe. Do not wear sandals. ?If you use a stepladder: ?Make sure that it is fully opened.  Do not climb a closed stepladder. ?Make sure that both sides of the stepladder are locked into place. ?Ask someone to hold it for you, if possible. ?Clearly mark and make sure that you can see: ?Any grab bar

## 2021-04-27 ENCOUNTER — Other Ambulatory Visit: Payer: Self-pay | Admitting: Family Medicine

## 2021-05-24 ENCOUNTER — Other Ambulatory Visit: Payer: Self-pay | Admitting: Family Medicine

## 2021-05-24 DIAGNOSIS — E1169 Type 2 diabetes mellitus with other specified complication: Secondary | ICD-10-CM

## 2021-06-30 ENCOUNTER — Other Ambulatory Visit: Payer: Self-pay | Admitting: Family Medicine

## 2021-07-04 ENCOUNTER — Encounter: Payer: Self-pay | Admitting: Family Medicine

## 2021-07-04 MED ORDER — ONETOUCH DELICA LANCETS 33G MISC
99 refills | Status: AC
Start: 2021-07-04 — End: ?

## 2021-07-07 ENCOUNTER — Ambulatory Visit: Payer: Medicare HMO | Admitting: Podiatry

## 2021-07-07 ENCOUNTER — Encounter: Payer: Self-pay | Admitting: Podiatry

## 2021-07-07 DIAGNOSIS — B351 Tinea unguium: Secondary | ICD-10-CM

## 2021-07-07 DIAGNOSIS — M79675 Pain in left toe(s): Secondary | ICD-10-CM | POA: Diagnosis not present

## 2021-07-07 DIAGNOSIS — M79674 Pain in right toe(s): Secondary | ICD-10-CM

## 2021-07-07 DIAGNOSIS — I739 Peripheral vascular disease, unspecified: Secondary | ICD-10-CM

## 2021-07-07 DIAGNOSIS — G610 Guillain-Barre syndrome: Secondary | ICD-10-CM | POA: Diagnosis not present

## 2021-07-16 NOTE — Progress Notes (Signed)
  Subjective:  Patient ID: Jon Townsend, male    DOB: [birth year],  MRN: [id number]  Jon Townsend presents to clinic today for at risk foot care. Patient has history of GBS and painful elongated mycotic toenails 1-5 bilaterally which are tender when wearing enclosed shoe gear. Pain is relieved with periodic professional debridement.  New problem(s): None.   PCP is [name], DO , and last visit was March 08, 2021.  Allergies  Allergen Reactions   Venlafaxine Other (See Comments)    Caused Insomnia    Review of Systems: Negative except as noted in the HPI.  Objective: No changes noted in today's physical examination. Objective:   Vascular Examination: Vascular status intact b/l with faintly palpable pedal pulses. Pedal hair present b/l. CFT immediate b/l. Trace edema b/l. Brawny pigmentation noted b/l LE. No pain with calf compression b/l. Skin temperature gradient WNL b/l.   Neurological Examination: Sensation grossly intact b/l with 10 gram monofilament. Vibratory sensation intact b/l. Pt has subjective symptoms of neuropathy due to GBS: legs/feet feel heavy at night.  Dermatological Examination: Pedal skin with normal turgor, texture and tone b/l. Toenails 1-5 b/l thick, discolored, elongated with subungual debris and pain on dorsal palpation. No hyperkeratotic lesions noted b/l.  Onycholysis of right great toe and right 2nd digit. There is evidence of old subungual hematoma. There is no erythema, no edema, no drainage, no fluctuance.  Musculoskeletal Examination: Muscle strength 4/5 to all lower extremity muscle groups bilaterally. Hammertoe deformity noted 2-5 b/l.  Radiographs: None     Latest Ref Rng & Units 03/08/2021   10:01 AM 09/07/2020    9:49 AM  Hemoglobin A1C  Hemoglobin-A1c 4.6 - 6.5 % 6.9  6.6    Assessment/Plan: 1. Pain due to onychomycosis of toenails of both feet   2. Guillain Barr syndrome ([place])   3. PVD (peripheral vascular disease)  ([place])      -Patient was evaluated and treated. All patient's and/or POA's questions/concerns answered on today's visit. -Toenails 1-5 left foot and 3-5 right foot debrided in length and girth without iatrogenic bleeding with sterile nail nipper and dremel.  -Loose nailplate right great toe and right second digit gently debrided to level of adherence. No further treatment required by patient. -Patient/POA to call should there be question/concern in the interim.   Return in about 3 months (around 10/07/2021).  Marzetta Board, DPM

## 2021-07-19 ENCOUNTER — Telehealth: Payer: Self-pay | Admitting: Family Medicine

## 2021-07-19 NOTE — Telephone Encounter (Signed)
Complete Patient Assistance form for Refill for ozempic due to be refilled on 09/28/2021 Faxed to [employer] at [phone]. Received confirmation.

## 2021-07-25 ENCOUNTER — Other Ambulatory Visit: Payer: Self-pay | Admitting: Family Medicine

## 2021-08-02 ENCOUNTER — Telehealth: Payer: Self-pay

## 2021-08-02 NOTE — Telephone Encounter (Signed)
Ozempic arrived today , placed in fridge in clinical area   Patient called and notified of shipment , stated he would come pick up medication soon

## 2021-08-28 ENCOUNTER — Encounter: Payer: Self-pay | Admitting: Family Medicine

## 2021-08-30 ENCOUNTER — Ambulatory Visit (INDEPENDENT_AMBULATORY_CARE_PROVIDER_SITE_OTHER): Payer: Medicare HMO | Admitting: Family Medicine

## 2021-08-30 ENCOUNTER — Encounter: Payer: Self-pay | Admitting: Family Medicine

## 2021-08-30 VITALS — BP 110/72 | HR 66 | Temp 98.2°F | Ht 73.0 in | Wt 253.2 lb

## 2021-08-30 DIAGNOSIS — E669 Obesity, unspecified: Secondary | ICD-10-CM

## 2021-08-30 DIAGNOSIS — I10 Essential (primary) hypertension: Secondary | ICD-10-CM | POA: Diagnosis not present

## 2021-08-30 DIAGNOSIS — R350 Frequency of micturition: Secondary | ICD-10-CM

## 2021-08-30 DIAGNOSIS — E781 Pure hyperglyceridemia: Secondary | ICD-10-CM | POA: Diagnosis not present

## 2021-08-30 DIAGNOSIS — E1169 Type 2 diabetes mellitus with other specified complication: Secondary | ICD-10-CM | POA: Diagnosis not present

## 2021-08-30 DIAGNOSIS — N401 Enlarged prostate with lower urinary tract symptoms: Secondary | ICD-10-CM

## 2021-08-30 LAB — COMPREHENSIVE METABOLIC PANEL
ALT: 27 U/L (ref 0–53)
AST: 26 U/L (ref 0–37)
Albumin: 4.2 g/dL (ref 3.5–5.2)
Alkaline Phosphatase: 68 U/L (ref 39–117)
BUN: 28 mg/dL — ABNORMAL HIGH (ref 6–23)
CO2: 29 mEq/L (ref 19–32)
Calcium: 8.9 mg/dL (ref 8.4–10.5)
Chloride: 99 mEq/L (ref 96–112)
Creatinine, Ser: 0.98 mg/dL (ref 0.40–1.50)
GFR: 79.63 mL/min (ref >60.00)
Glucose, Bld: 109 mg/dL — ABNORMAL HIGH (ref 70–99)
Potassium: 4.5 mEq/L (ref 3.5–5.1)
Sodium: 135 mEq/L (ref 135–145)
Total Bilirubin: 1.1 mg/dL (ref 0.2–1.2)
Total Protein: 6.4 g/dL (ref 6.0–8.3)

## 2021-08-30 LAB — LIPID PANEL
Cholesterol: 89 mg/dL (ref 0–200)
HDL: 34.2 mg/dL — ABNORMAL LOW (ref >39.00)
LDL Cholesterol: 36 mg/dL (ref 0–99)
NonHDL: 55.29
Total CHOL/HDL Ratio: 3
Triglycerides: 95 mg/dL (ref 0.0–149.0)
VLDL: 19 mg/dL (ref 0.0–40.0)

## 2021-08-30 LAB — MICROALBUMIN / CREATININE URINE RATIO
Creatinine,U: 83.3 mg/dL
Microalb Creat Ratio: 0.8 mg/g (ref 0.0–30.0)
Microalb, Ur: <0.7 mg/dL (ref 0.0–1.9)

## 2021-08-30 LAB — HEMOGLOBIN A1C: Hgb A1c MFr Bld: 6.6 % — ABNORMAL HIGH (ref 4.6–6.5)

## 2021-08-30 MED ORDER — GABAPENTIN 300 MG PO CAPS: 300.0000 mg | ORAL_CAPSULE | Freq: Three times a day (TID) | ORAL | 3 refills | Status: DC

## 2021-08-30 NOTE — Patient Instructions (Addendum)
Take 1 capsule of the Flomax for a few weeks and let me know how you do on MyChart.   Give [place] 2-3 business days to get the results of your labs back.   Keep the diet clean and stay active.  Let us know if you need anything.

## 2021-08-30 NOTE — Progress Notes (Signed)
Subjective:   Chief Complaint  Patient presents with   Follow-up    Challen Spainhour is a 66 y.o. male here for follow-up of diabetes.   Rankin's self monitored glucose range is low 100's.  Patient denies hypoglycemic reactions. He checks his glucose levels 2 time(s) per week. Patient does not require insulin.   Medications include: Metformin XR 1000 mg/d, Ozempic 0.5 mg/week Diet is overall healthy.  Exercise: lifting wts, cardio  Hypertension Patient presents for hypertension follow up. He does monitor home blood pressures. Blood pressures ranging on average from 110's/60's. He is compliant with medication- lisinopril 10 mg/d.  Patient has these side effects of medication: none Diet/exercise as above. No Cp or SOB.   Hyperlipidemia Patient presents for dyslipidemia follow up. Currently being treated with Lipitor 40 mg/d and compliance with treatment thus far has been good. He denies myalgias. Diet/exercise as above.  The patient is not known to have coexisting coronary artery disease.  BPH On Flomax 0.8 mg/d. Reports compliance. Getting lightheaded when he gets up sometimes. Stays hydrated overall. Urinary s/s's controlled.   Past Medical History:  Diagnosis Date   Allergy    BPH (benign prostatic hyperplasia)    Diabetes mellitus type 2 in obese (HCC)    Glaucoma    Hypertension      Related testing: Retinal exam: Done Pneumovax: done  Objective:  BP 110/72   Pulse 66   Temp 98.2 F (36.8 C) (Oral)   Ht '6\' 1"'$  (1.854 m)   Wt 253 lb 4 oz (114.9 kg)   SpO2 95%   BMI 33.41 kg/m  General:  Well developed, well nourished, in no apparent distress Lungs:  CTAB, no access msc use Cardio:  RRR, no bruits, no LE edema Psych: Age appropriate judgment and insight  Assessment:   Diabetes mellitus type 2 in obese ([place]) - Plan: Comprehensive metabolic panel, Lipid panel, Hemoglobin A1c, Microalbumin / creatinine urine ratio  Essential  hypertension  Hypertriglyceridemia  Benign prostatic hyperplasia with urinary frequency   Plan:   Chronic, stable. Cont Metformin XR 1000 mg/d, Ozempic 0.5 mg/week. Counseled on diet and exercise. Chronic, stable. Cont lisinopril 10 mg/d.  Chronic, stable. Cont Lipitor 40 mg/d.  Chronic, AE of med. Decreased Flomax to 0.4 mg/d for 2 weeks and update me. Stay hydrated.  F/u in 6 mo. The patient voiced understanding and agreement to the plan.  [place], DO 08/30/21 9:49 AM

## 2021-09-08 ENCOUNTER — Encounter: Payer: Self-pay | Admitting: Family Medicine

## 2021-09-09 ENCOUNTER — Other Ambulatory Visit: Payer: Self-pay | Admitting: Family Medicine

## 2021-09-09 MED ORDER — TAMSULOSIN HCL 0.4 MG PO CAPS: 0.4000 mg | ORAL_CAPSULE | Freq: Every day | ORAL | 3 refills | Status: DC

## 2021-09-18 IMAGING — CT CT ABD-PELV W/ CM
2 of 5 series · 16 of 46 positions shown, 18 images · IV contrast (Omni 300)
Comparison: Same day abdominal radiograph.

CLINICAL DATA: Acute abdominal pain, nonlocalized

EXAM:
CT ABDOMEN AND PELVIS WITH CONTRAST
TECHNIQUE: Multidetector CT imaging of the abdomen and pelvis was performed
using the standard protocol following bolus administration of
intravenous contrast.
CONTRAST:  100mL OMNIPAQUE IOHEXOL 300 MG/ML  SOLN

[Series 3: a/p w/ 5mm · axial · 0.97mm/px · z∈[+789,+1289]mm · 13 of 112 slices shown, 15 images]
[im 6/112  soft-tissue]
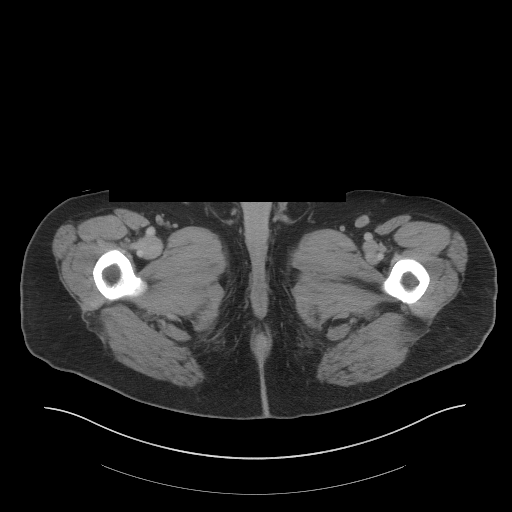
[im 6/112  bone]
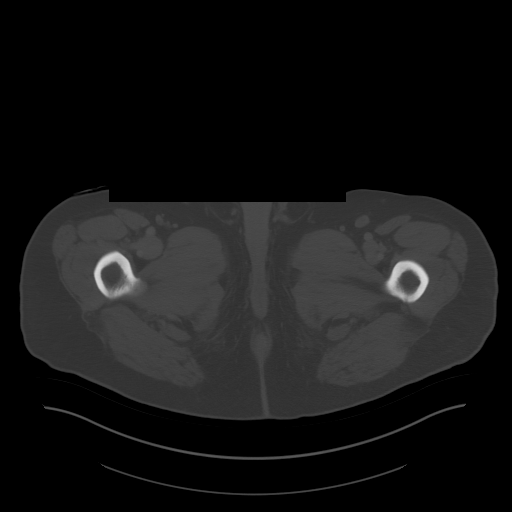
[im 17/112  soft-tissue]
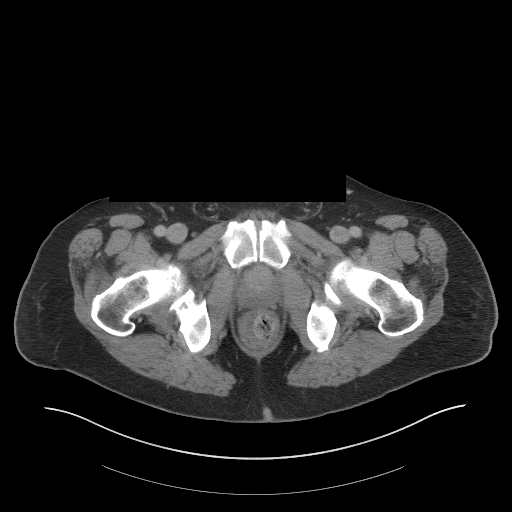
[im 23/112  soft-tissue]
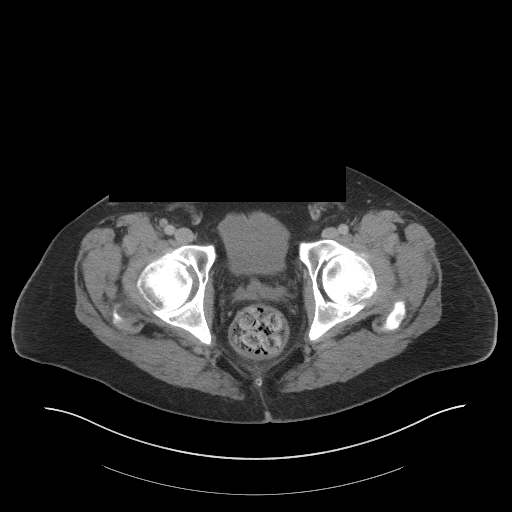
[im 34/112  soft-tissue]
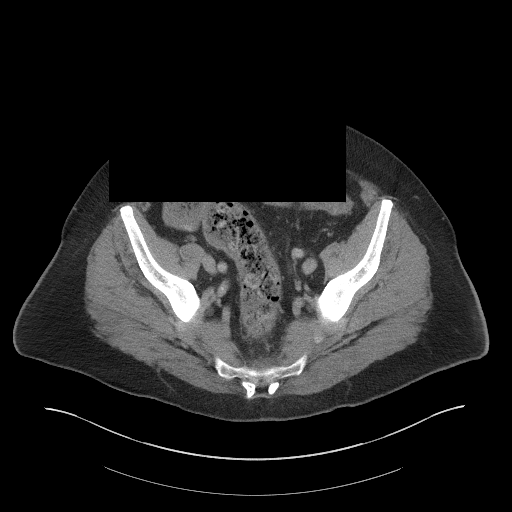
[im 39/112  soft-tissue]
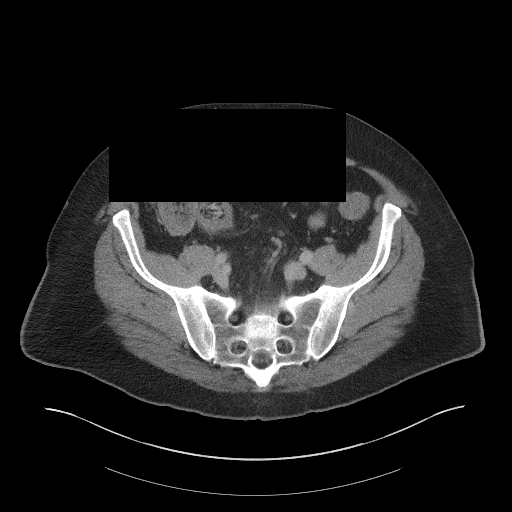
[im 50/112  soft-tissue]
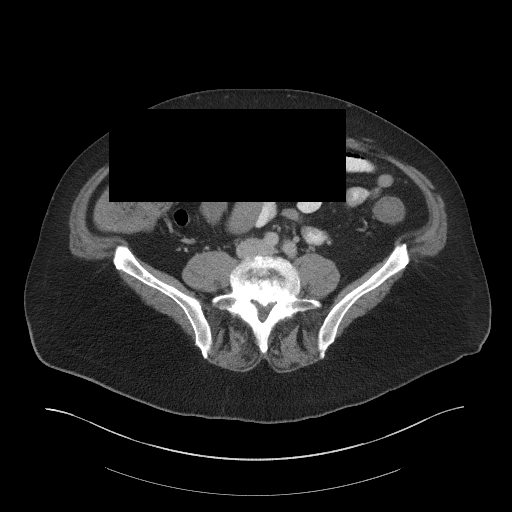
[im 56/112  soft-tissue]
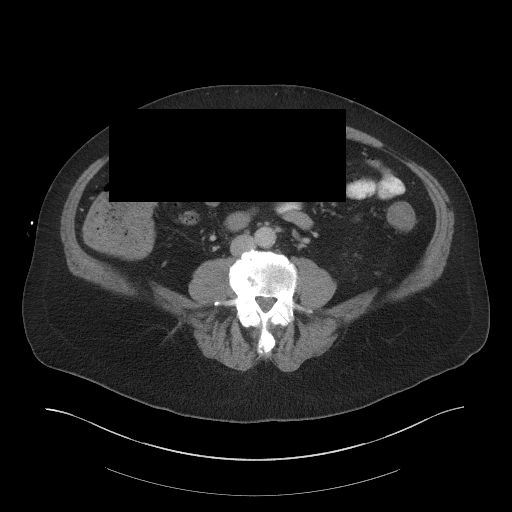
[im 62/112  soft-tissue]
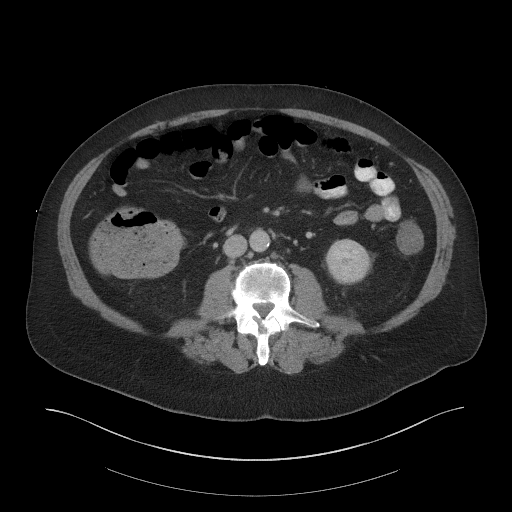
[im 73/112  soft-tissue]
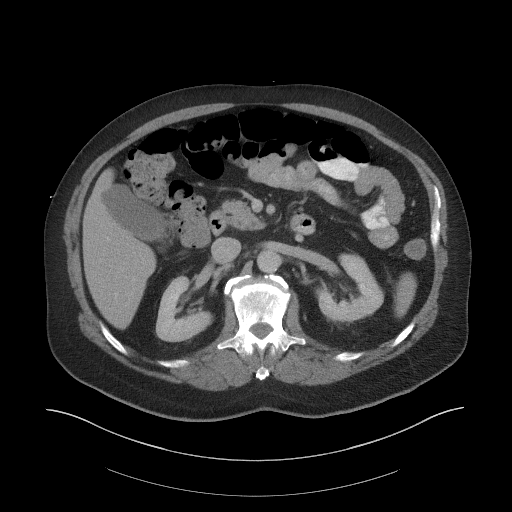
[im 73/112  bone]
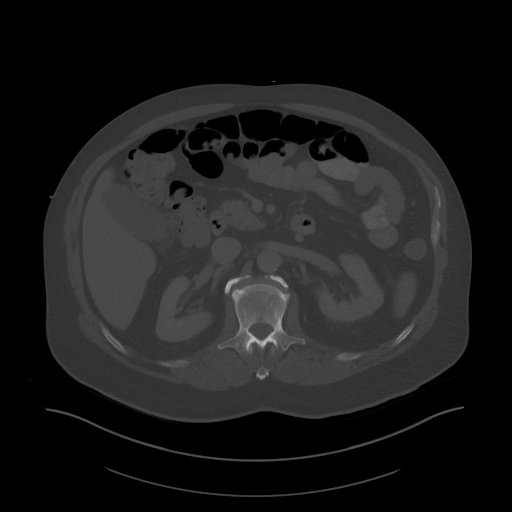
[im 78/112  soft-tissue]
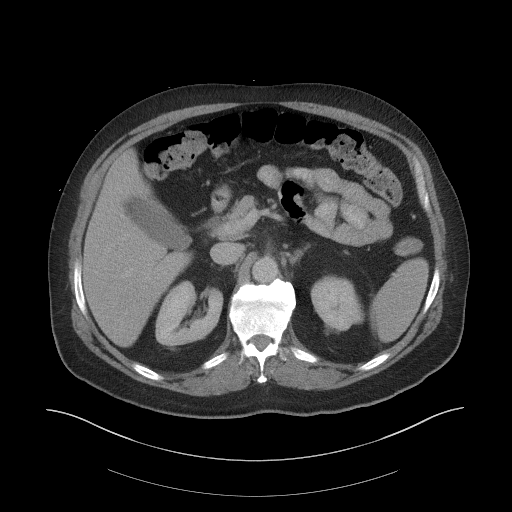
[im 89/112  soft-tissue]
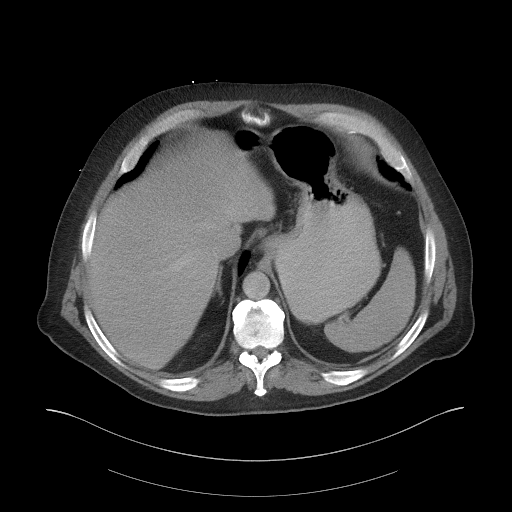
[im 95/112  soft-tissue]
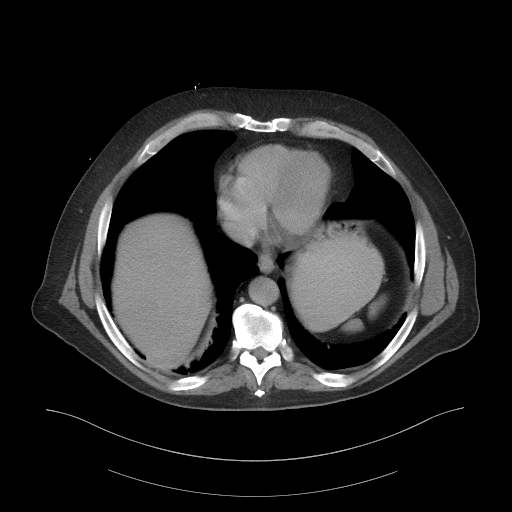
[im 106/112  soft-tissue]
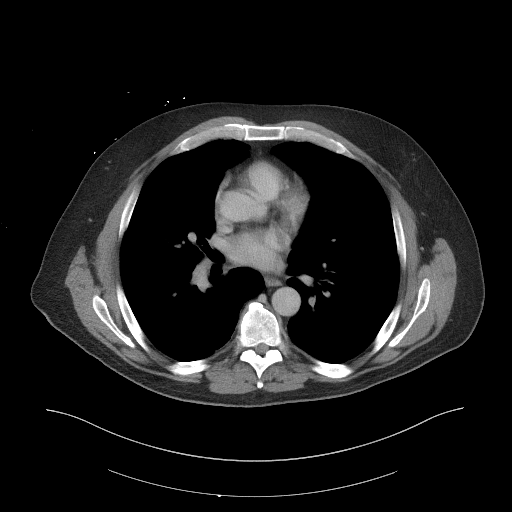

[Series 6: a/p w/ cor · coronal · 1.08mm/px · 3 of 170 slices shown]
[im 57/170  soft-tissue]
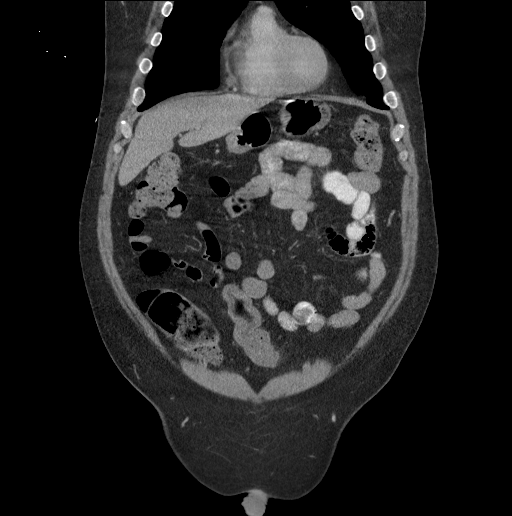
[im 76/170  soft-tissue]
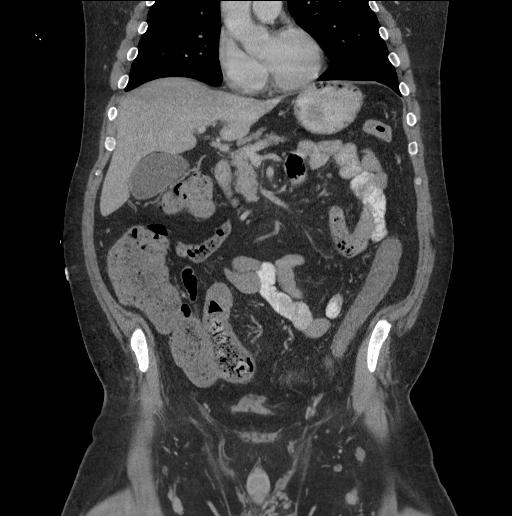
[im 94/170  soft-tissue]
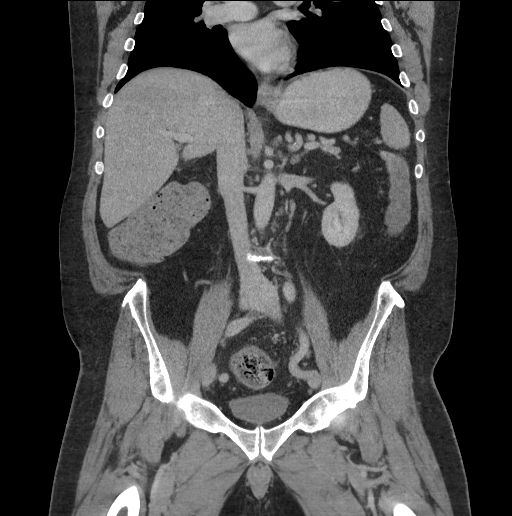

[16 of 46 positions shown; findings below may reference images not displayed]

FINDINGS: Lower chest: Bibasilar atelectasis. Normal size heart. No
pericardial effusion. Coronary artery calcifications.

Hepatobiliary: Diffuse hepatic steatosis. A few hepatic
hypodensities the largest of which measures 1.5 cm in segment 4 B
which are technically indeterminate but favored represent hepatic
cyst. Gallbladder is unremarkable. No biliary ductal dilatation.

Pancreas: Atrophy of the tail and body of the pancreas. No
surrounding inflammatory changes. No pancreatic ductal dilatation.

Spleen: Normal in size without focal abnormality.

Adrenals/Urinary Tract: Unremarkable adrenal glands. No
hydronephrosis. No nephrolithiasis. No suspicious enhancing lesions.
No filling defect in the collecting systems or proximal ureters on
delayed imaging. Bladder is decompressed otherwise unremarkable.

Stomach/Bowel: Stomach is unremarkable. Normal positioning of the
duodenum/ligament of [place]. No suspicious small bowel wall
thickening or 2 dilation. Small volume formed stool throughout the
colon. There is some loss of distal colonic architecture and subtle
wall thickening which may be chronic or secondary to colitis.

Vascular/Lymphatic: Aortic atherosclerosis. No enlarged abdominal or
pelvic lymph nodes.

Reproductive: Prostate is unremarkable.

Other: No abdominopelvic ascites.

Musculoskeletal: Diffuse idiopathic skeletal hyperostosis.
Multilevel degenerative change of the lumbar spine including
multiple Schmorl's nodes. No acute osseous abnormality.
IMPRESSION: 1. Subtle distal colonic wall thickening with some loss of
architecture which may be chronic or secondary to colitis.
2. Small volume formed stool throughout the colon.
3. Diffuse hepatic steatosis.
4. Aortic atherosclerosis.

Aortic Atherosclerosis (CKN1P-UAJ.J).

## 2021-09-23 IMAGING — DX DG ABDOMEN 1V
2 series · 2 of 2 positions shown · non-contrast
Comparison: 02/14/2020

CLINICAL DATA: Abdominal distension

EXAM:
ABDOMEN - 1 VIEW

[abdomen kub (1 of 2)]
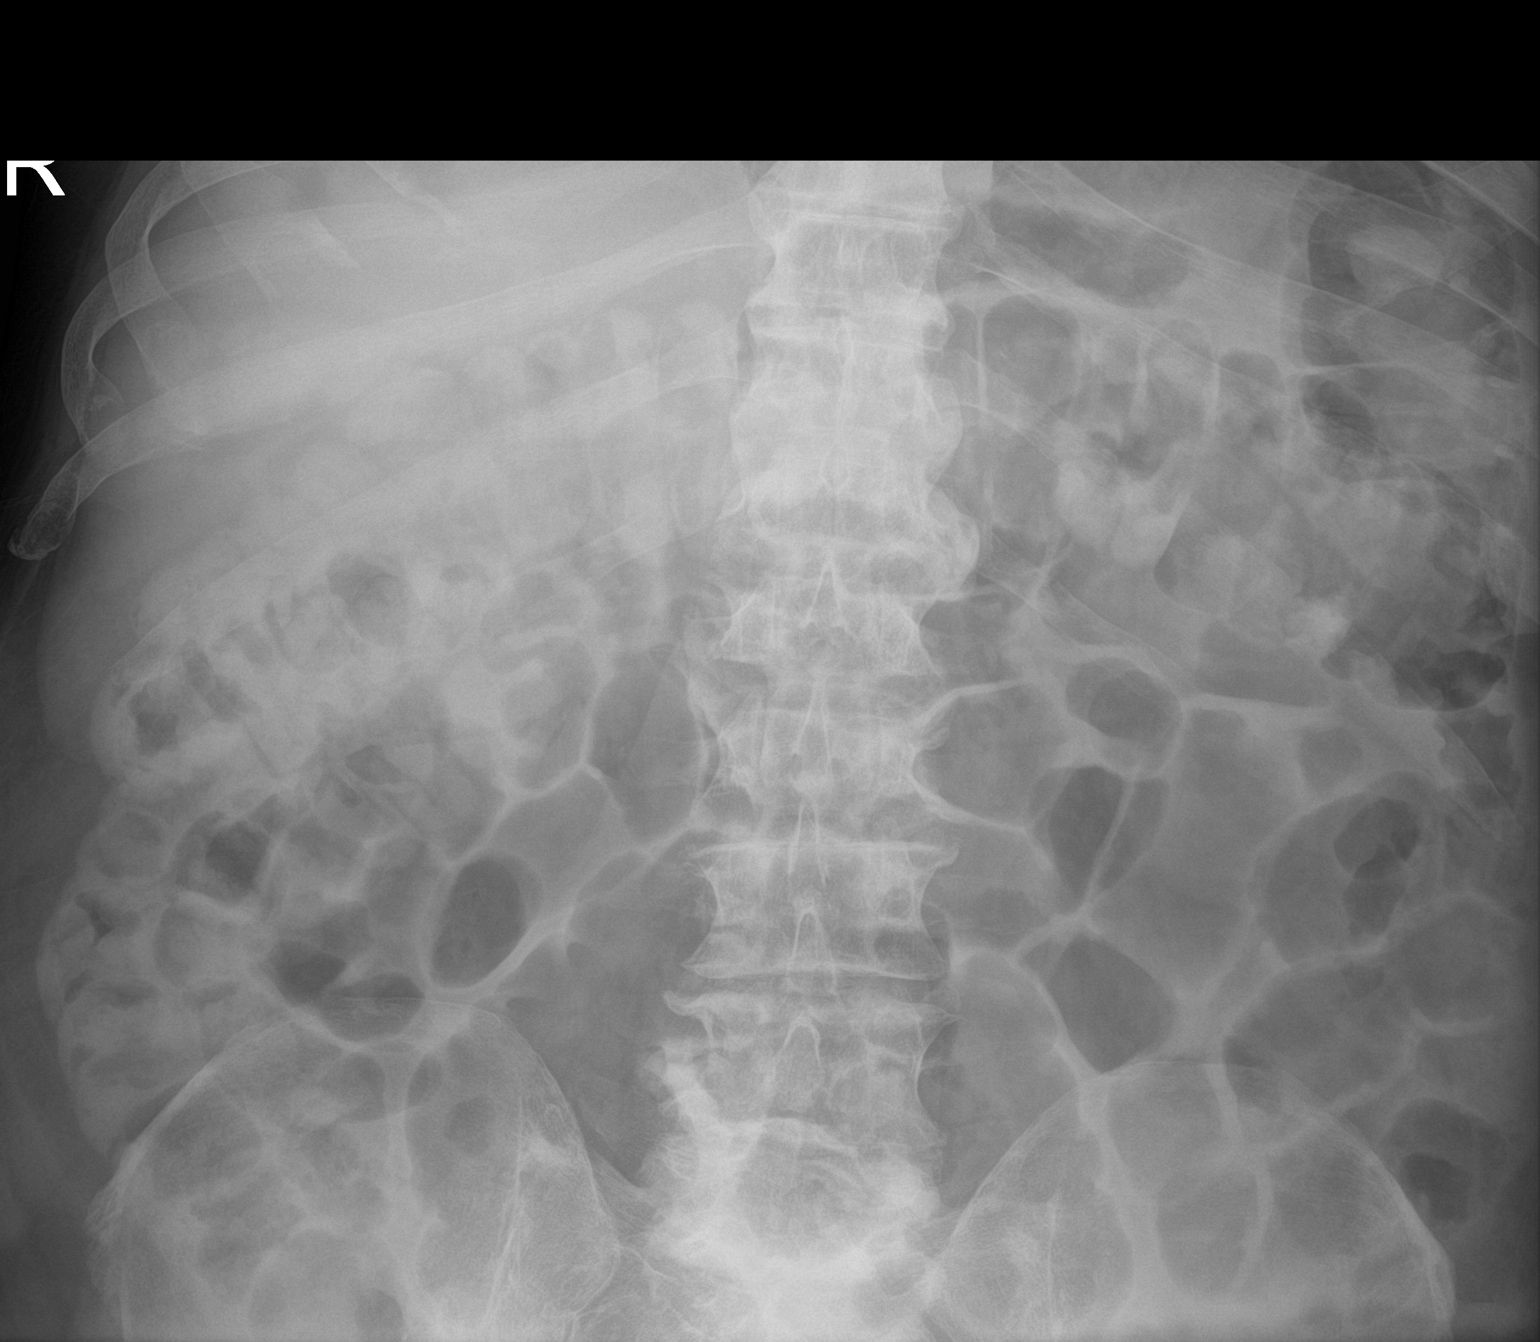

[abdomen kub (2 of 2)]
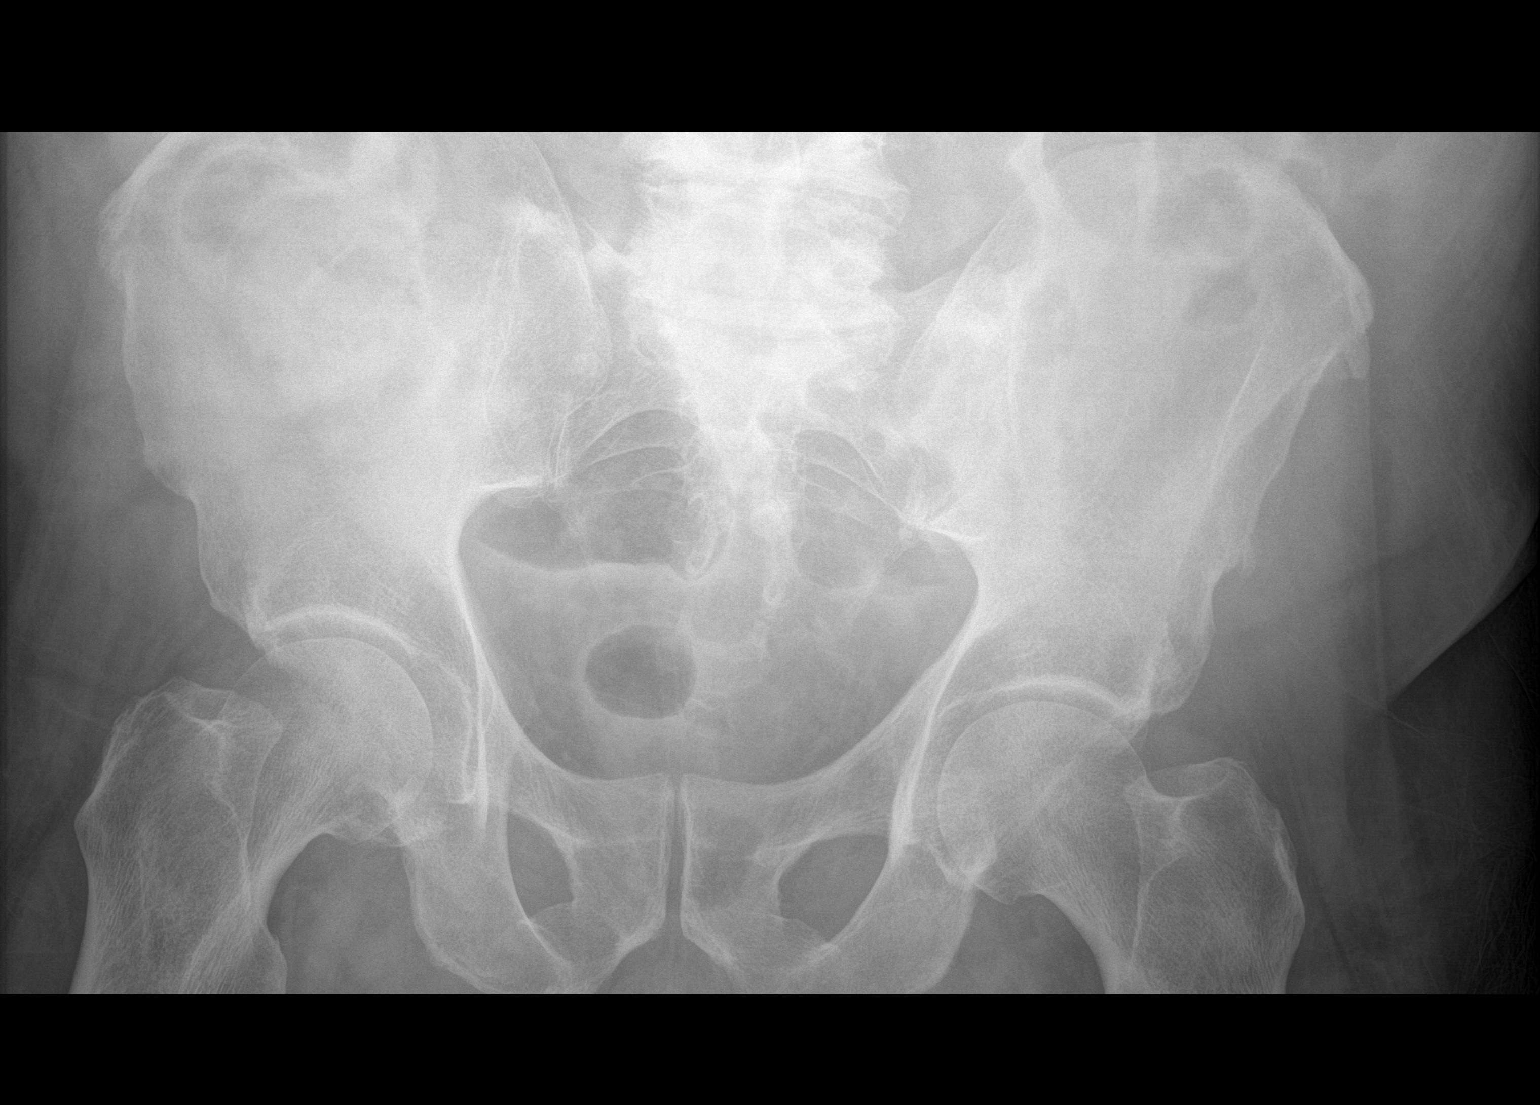

[2 of 2 positions shown; findings below may reference images not displayed]

FINDINGS: Numerous gas-filled nondilated loops of large and small bowel are
seen throughout the abdomen most in keeping with an mild adynamic
ileus. There is no gross free intraperitoneal gas. No organomegaly.
Degenerative changes are seen within the lumbar spine. No abnormal
calcifications within the abdomen.
IMPRESSION: Mild ileus

## 2021-09-25 ENCOUNTER — Other Ambulatory Visit: Payer: Self-pay | Admitting: Family Medicine

## 2021-10-02 ENCOUNTER — Other Ambulatory Visit: Payer: Self-pay | Admitting: Family Medicine

## 2021-10-02 ENCOUNTER — Encounter: Payer: Self-pay | Admitting: Family Medicine

## 2021-10-03 ENCOUNTER — Other Ambulatory Visit: Payer: Self-pay | Admitting: Family Medicine

## 2021-10-03 MED ORDER — LISINOPRIL-HYDROCHLOROTHIAZIDE 10-12.5 MG PO TABS
1.0000 | ORAL_TABLET | Freq: Every day | ORAL | 3 refills | Status: DC
Start: 2021-10-03 — End: 2022-09-05

## 2021-10-13 ENCOUNTER — Ambulatory Visit: Payer: Medicare HMO | Admitting: Podiatry

## 2021-10-13 ENCOUNTER — Encounter: Payer: Self-pay | Admitting: Podiatry

## 2021-10-13 DIAGNOSIS — E119 Type 2 diabetes mellitus without complications: Secondary | ICD-10-CM | POA: Diagnosis not present

## 2021-10-13 DIAGNOSIS — M79674 Pain in right toe(s): Secondary | ICD-10-CM

## 2021-10-13 DIAGNOSIS — M79675 Pain in left toe(s): Secondary | ICD-10-CM | POA: Diagnosis not present

## 2021-10-13 DIAGNOSIS — E1165 Type 2 diabetes mellitus with hyperglycemia: Secondary | ICD-10-CM | POA: Diagnosis not present

## 2021-10-13 DIAGNOSIS — M2041 Other hammer toe(s) (acquired), right foot: Secondary | ICD-10-CM

## 2021-10-13 DIAGNOSIS — M2042 Other hammer toe(s) (acquired), left foot: Secondary | ICD-10-CM

## 2021-10-13 DIAGNOSIS — I739 Peripheral vascular disease, unspecified: Secondary | ICD-10-CM

## 2021-10-13 DIAGNOSIS — B351 Tinea unguium: Secondary | ICD-10-CM | POA: Diagnosis not present

## 2021-10-13 DIAGNOSIS — G610 Guillain-Barre syndrome: Secondary | ICD-10-CM

## 2021-10-16 NOTE — Progress Notes (Signed)
ANNUAL DIABETIC FOOT EXAM  Subjective: Jon Townsend presents today for annual diabetic foot examination.  Chief Complaint  Patient presents with   Nail Problem    Diabetic foot care BS- do not check A1C-5.9 PCP-Wendling Crosby [place]   Patient confirms h/o diabetes.  Patient denies any h/o foot wounds.  Patient has been diagnosed with neuropathy and it is managed with gabapentin.  Risk factors: diabetes, diabetic neuropathy, PAD, HTN, h/o tobacco use in remission.  [name], DO is patient's PCP. Last visit was August 30, 2021.  Past Medical History:  Diagnosis Date   Allergy    BPH (benign prostatic hyperplasia)    Diabetes mellitus type 2 in obese ([place])    Glaucoma    Hypertension    Patient Active Problem List   Diagnosis Date Noted   PVD (peripheral vascular disease) ([place]) 03/08/2021   Acute blood loss anemia    Bloody stool    Controlled type 2 diabetes mellitus with hyperglycemia, without long-term current use of insulin ([place])    Weakness 02/03/2020   Essential hypertension 02/03/2020   Polyneuropathy 02/03/2020   COVID-19 virus infection 02/03/2020   Diabetes mellitus type 2 in obese [facility])    BPH (benign prostatic hyperplasia)    Chronic right-sided low back pain without sciatica 04/03/2016   H/O urinary frequency 04/03/2016   Annual physical exam 02/03/2016   Low testosterone level in male 01/15/2015   ADD (attention deficit disorder) 01/14/2015   Class 1 obesity in adult 01/14/2015   Elevated BP without diagnosis of hypertension 01/14/2015   Lumbar paraspinal muscle spasm 01/14/2015   Rosacea 01/14/2015   Past Surgical History:  Procedure Laterality Date   Broken finger     TONSILLECTOMY     Current Outpatient Medications on File Prior to Visit  Medication Sig Dispense Refill   atorvastatin (LIPITOR) 40 MG tablet Take 1 tablet by mouth once daily 90 tablet 1   Coenzyme Q10 (COQ10) 100 MG CAPS Take 100 mg by mouth in the morning.      fexofenadine (ALLEGRA) 180 MG tablet Take 180 mg by mouth in the morning.     gabapentin (NEURONTIN) 300 MG capsule Take 1 capsule (300 mg total) by mouth 3 (three) times daily. 270 capsule 3   glucose blood (ONETOUCH VERIO) test strip Use as once daily to check blood sugar. 100 each 11   Krill Oil 1000 MG CAPS Take 1,000 mg by mouth daily.     latanoprost (XALATAN) 0.005 % ophthalmic solution Place 1 drop into both eyes at bedtime.     lisinopril-hydrochlorothiazide (ZESTORETIC) 10-12.5 MG tablet Take 1 tablet by mouth daily. 90 tablet 3   magnesium oxide (MAG-OX) 400 (241.3 Mg) MG tablet Take 1 tablet (400 mg total) by mouth daily.     metFORMIN (GLUCOPHAGE-XR) 500 MG 24 hr tablet Take 2 tablets (1,000 mg total) by mouth daily with breakfast. 180 tablet 3   OneTouch Delica Lancets 25O MISC Check blood sugars once daily 100 each PRN   OZEMPIC, 0.25 OR 0.5 MG/DOSE, 2 MG/3ML SOPN Inject into the skin.     tamsulosin (FLOMAX) 0.4 MG CAPS capsule Take 1 capsule (0.4 mg total) by mouth daily. 90 capsule 3   vitamin C (ASCORBIC ACID) 500 MG tablet Take 500-1,000 mg by mouth daily.     No current facility-administered medications on file prior to visit.    Allergies  Allergen Reactions   Venlafaxine Other (See Comments)    Caused Insomnia   Social  History   Occupational History   Not on file  Tobacco Use   Smoking status: Former   Smokeless tobacco: Never   Tobacco comments:    Quit 35 years ago  Substance and Sexual Activity   Alcohol use: Never   Drug use: Never   Sexual activity: Not on file   Family History  Problem Relation Age of Onset   Cancer Father    Early death Father    Diabetes Sister    Heart disease Sister    COPD Sister    Diabetes Sister    Colon cancer Neg Hx    Esophageal cancer Neg Hx    Stomach cancer Neg Hx    Rectal cancer Neg Hx    Immunization History  Administered Date(s) Administered   Influenza,inj,Quad PF,6+ Mos 10/20/2018    Influenza,inj,quad, With Preservative 09/29/2017   Influenza-Unspecified 10/20/2018   Pneumococcal Polysaccharide-23 01/23/2018   Zoster Recombinat (Shingrix) 04/26/2017, 07/04/2017    Review of Systems: Negative except as noted in the HPI.   Objective: There were no vitals filed for this visit.  Jon Townsend is a pleasant 66 y.o. male in NAD. AAO X 3.  Vascular Examination: Capillary refill time immediate b/l.Vascular status intact b/l with palpable pedal pulses. Pedal hair present b/l. No edema. No pain with calf compression b/l. Skin temperature gradient WNL b/l.   Neurological Examination: Sensation grossly intact b/l with 10 gram monofilament. Vibratory sensation intact b/l. Pt has subjective symptoms of neuropathy.  Dermatological Examination: Pedal skin with normal turgor, texture and tone b/l. Toenails 1-5 b/l thick, discolored, elongated with subungual debris and pain on dorsal palpation. No hyperkeratotic nor porokeratotic lesions present on today's visit.  Musculoskeletal Examination: Muscle strength 4/5 to all lower extremity muscle groups bilaterally. Hammertoe deformity noted 2-5 b/l.  Radiographs: None  Last A1c:      Latest Ref Rng & Units 08/30/2021    9:32 AM 03/08/2021   10:01 AM  Hemoglobin A1C  Hemoglobin-A1c 4.6 - 6.5 % 6.6  6.9    Footwear Assessment: Does the patient wear appropriate shoes? Yes. Does the patient need inserts/orthotics? No.  ADA Risk Categorization: Low Risk :  Patient has all of the following: Intact protective sensation No prior foot ulcer  No severe deformity Pedal pulses present  Assessment: 1. Pain due to onychomycosis of toenails of both feet   2. PVD (peripheral vascular disease) ([place])   3. Guillain Barr syndrome ([place])   4. Acquired hammertoes of both feet   5. Controlled type 2 diabetes mellitus with hyperglycemia, without long-term current use of insulin ([place])   6. Encounter for diabetic foot exam [facility])      Plan: -Patient was evaluated and treated. All patient's and/or POA's questions/concerns answered on today's visit. -Diabetic foot examination performed today. -Stressed the importance of good glycemic control and the detriment of not  controlling glucose levels in relation to the foot. -Patient to continue soft, supportive shoe gear daily. -Mycotic toenails 1-5 bilaterally were debrided in length and girth with sterile nail nippers and dremel without incident. -Patient/POA to call should there be question/concern in the interim. Return in about 3 months (around 01/13/2022).  Marzetta Board, DPM

## 2021-10-28 ENCOUNTER — Telehealth: Payer: Self-pay

## 2021-10-28 NOTE — Telephone Encounter (Signed)
Called pt to let him know his Ozempic  Is here in office for pickup. Pt states will come by Monday.

## 2021-11-03 DIAGNOSIS — Z135 Encounter for screening for eye and ear disorders: Secondary | ICD-10-CM | POA: Diagnosis not present

## 2021-11-03 DIAGNOSIS — H524 Presbyopia: Secondary | ICD-10-CM | POA: Diagnosis not present

## 2021-11-03 DIAGNOSIS — H2513 Age-related nuclear cataract, bilateral: Secondary | ICD-10-CM | POA: Diagnosis not present

## 2021-11-03 DIAGNOSIS — E119 Type 2 diabetes mellitus without complications: Secondary | ICD-10-CM | POA: Diagnosis not present

## 2021-11-13 ENCOUNTER — Encounter: Payer: Self-pay | Admitting: Family Medicine

## 2021-11-13 DIAGNOSIS — E1169 Type 2 diabetes mellitus with other specified complication: Secondary | ICD-10-CM

## 2021-11-14 MED ORDER — ATORVASTATIN CALCIUM 40 MG PO TABS: 40.0000 mg | ORAL_TABLET | Freq: Every day | ORAL | 3 refills | Status: DC

## 2021-11-14 MED ORDER — TAMSULOSIN HCL 0.4 MG PO CAPS: 0.4000 mg | ORAL_CAPSULE | Freq: Every day | ORAL | 3 refills | Status: DC

## 2021-11-15 DIAGNOSIS — L814 Other melanin hyperpigmentation: Secondary | ICD-10-CM | POA: Diagnosis not present

## 2021-11-15 DIAGNOSIS — D225 Melanocytic nevi of trunk: Secondary | ICD-10-CM | POA: Diagnosis not present

## 2021-11-15 DIAGNOSIS — L821 Other seborrheic keratosis: Secondary | ICD-10-CM | POA: Diagnosis not present

## 2021-11-15 DIAGNOSIS — D2239 Melanocytic nevi of other parts of face: Secondary | ICD-10-CM | POA: Diagnosis not present

## 2022-01-08 ENCOUNTER — Encounter: Payer: Self-pay | Admitting: Family Medicine

## 2022-03-07 ENCOUNTER — Encounter: Payer: Self-pay | Admitting: Family Medicine

## 2022-03-07 ENCOUNTER — Ambulatory Visit (INDEPENDENT_AMBULATORY_CARE_PROVIDER_SITE_OTHER): Payer: Medicare HMO | Admitting: Family Medicine

## 2022-03-07 VITALS — BP 120/72 | HR 71 | Temp 97.7°F | Ht 73.0 in | Wt 261.4 lb

## 2022-03-07 DIAGNOSIS — R5383 Other fatigue: Secondary | ICD-10-CM

## 2022-03-07 DIAGNOSIS — Z0000 Encounter for general adult medical examination without abnormal findings: Secondary | ICD-10-CM | POA: Diagnosis not present

## 2022-03-07 DIAGNOSIS — E1169 Type 2 diabetes mellitus with other specified complication: Secondary | ICD-10-CM

## 2022-03-07 DIAGNOSIS — E669 Obesity, unspecified: Secondary | ICD-10-CM | POA: Diagnosis not present

## 2022-03-07 LAB — CBC
HCT: 47.5 % (ref 39.0–52.0)
Hemoglobin: 15.9 g/dL (ref 13.0–17.0)
MCHC: 33.4 g/dL (ref 30.0–36.0)
MCV: 92.5 fl (ref 78.0–100.0)
Platelets: 190 10*3/uL (ref 150.0–400.0)
RBC: 5.13 Mil/uL (ref 4.22–5.81)
RDW: 13.6 % (ref 11.5–15.5)
WBC: 6.2 10*3/uL (ref 4.0–10.5)

## 2022-03-07 LAB — LIPID PANEL
Cholesterol: 106 mg/dL (ref 0–200)
HDL: 39.1 mg/dL (ref >39.00)
LDL Cholesterol: 46 mg/dL (ref 0–99)
NonHDL: 67.39
Total CHOL/HDL Ratio: 3
Triglycerides: 106 mg/dL (ref 0.0–149.0)
VLDL: 21.2 mg/dL (ref 0.0–40.0)

## 2022-03-07 LAB — COMPREHENSIVE METABOLIC PANEL
ALT: 23 U/L (ref 0–53)
AST: 21 U/L (ref 0–37)
Albumin: 4.1 g/dL (ref 3.5–5.2)
Alkaline Phosphatase: 81 U/L (ref 39–117)
BUN: 23 mg/dL (ref 6–23)
CO2: 31 mEq/L (ref 19–32)
Calcium: 9.6 mg/dL (ref 8.4–10.5)
Chloride: 98 mEq/L (ref 96–112)
Creatinine, Ser: 1.01 mg/dL (ref 0.40–1.50)
GFR: 76.52 mL/min (ref >60.00)
Glucose, Bld: 111 mg/dL — ABNORMAL HIGH (ref 70–99)
Potassium: 4.2 mEq/L (ref 3.5–5.1)
Sodium: 136 mEq/L (ref 135–145)
Total Bilirubin: 1.1 mg/dL (ref 0.2–1.2)
Total Protein: 6.8 g/dL (ref 6.0–8.3)

## 2022-03-07 LAB — HEMOGLOBIN A1C: Hgb A1c MFr Bld: 6.6 % — ABNORMAL HIGH (ref 4.6–6.5)

## 2022-03-07 LAB — TSH: TSH: 1.93 u[IU]/mL (ref 0.35–5.50)

## 2022-03-07 LAB — T4, FREE: Free T4: 0.78 ng/dL (ref 0.60–1.60)

## 2022-03-07 MED ORDER — FLUTICASONE PROPIONATE 50 MCG/ACT NA SUSP: 2.0000 | Freq: Every day | NASAL | 6 refills | Status: DC

## 2022-03-07 MED ORDER — SEMAGLUTIDE (1 MG/DOSE) 4 MG/3ML ~~LOC~~ SOPN: 1.0000 mg | PEN_INJECTOR | SUBCUTANEOUS | 5 refills | Status: DC

## 2022-03-07 NOTE — Patient Instructions (Addendum)
Give [place] 2-3 business days to get the results of your labs back.   Keep the diet clean and stay active.  Please get your tetanus booster at the pharmacy.  I recommend the pneumonia vaccine and would prioritize this over any other available vaccine.   Please get me a copy of your advanced directive form at your convenience.   Add another 1-2 days per week of light exercise like walking.   Try to drink 55-60 oz of water daily outside of exercise.  Take Metamucil or Benefiber daily.  Flonase (fluticasone); nasal spray that is over the counter. 2 sprays each nostril, once daily. Aim towards the same side eye when you spray.  Let us know if you need anything.

## 2022-03-07 NOTE — Progress Notes (Signed)
Chief Complaint  Patient presents with   Annual Exam    [name] is here for a complete physical.   His last physical was >1 year ago.  Current diet: in general, a "healthy" diet.   Current exercise: cardio and lifting wts Weight trend: stable Fatigue out of ordinary? Sometimes, +famhx of hypothyroid. Seat belt? Yes.   Advanced directive? No  Health maintenance Shingrix- Yes Colonoscopy- Yes Tetanus- Due Hep C- Yes Pneumonia vaccine- Due AAA screening- Yes  Past Medical History:  Diagnosis Date   Allergy    BPH (benign prostatic hyperplasia)    Diabetes mellitus type 2 in obese (HCC)    Glaucoma    Hypertension      Past Surgical History:  Procedure Laterality Date   Broken finger     TONSILLECTOMY      Medications  Current Outpatient Medications on File Prior to Visit  Medication Sig Dispense Refill   atorvastatin (LIPITOR) 40 MG tablet Take 1 tablet (40 mg total) by mouth daily. 90 tablet 3   Coenzyme Q10 (COQ10) 100 MG CAPS Take 100 mg by mouth in the morning.     fexofenadine (ALLEGRA) 180 MG tablet Take 180 mg by mouth in the morning.     gabapentin (NEURONTIN) 300 MG capsule Take 1 capsule (300 mg total) by mouth 3 (three) times daily. 270 capsule 3   glucose blood (ONETOUCH VERIO) test strip Use as once daily to check blood sugar. 100 each 11   Krill Oil 1000 MG CAPS Take 1,000 mg by mouth daily.     latanoprost (XALATAN) 0.005 % ophthalmic solution Place 1 drop into both eyes at bedtime.     lisinopril-hydrochlorothiazide (ZESTORETIC) 10-12.5 MG tablet Take 1 tablet by mouth daily. 90 tablet 3   magnesium oxide (MAG-OX) 400 (241.3 Mg) MG tablet Take 1 tablet (400 mg total) by mouth daily.     metFORMIN (GLUCOPHAGE-XR) 500 MG 24 hr tablet Take 2 tablets (1,000 mg total) by mouth daily with breakfast. 180 tablet 3   OneTouch Delica Lancets 99991111 MISC Check blood sugars once daily 100 each PRN   tamsulosin (FLOMAX) 0.4 MG CAPS capsule Take 1  capsule (0.4 mg total) by mouth daily. 90 capsule 3   vitamin C (ASCORBIC ACID) 500 MG tablet Take 500-1,000 mg by mouth daily.     Allergies Allergies  Allergen Reactions   Venlafaxine Other (See Comments)    Caused Insomnia    Family History Family History  Problem Relation Age of Onset   Cancer Father    Early death Father    Diabetes Sister    Heart disease Sister    COPD Sister    Diabetes Sister    Colon cancer Neg Hx    Esophageal cancer Neg Hx    Stomach cancer Neg Hx    Rectal cancer Neg Hx     Review of Systems: Constitutional:  no fevers Eye:  no recent significant change in vision Ears:  No changes in hearing Nose/Mouth/Throat:  no complaints of nasal congestion, no sore throat Cardiovascular: no chest pain Respiratory:  No shortness of breath Gastrointestinal:  No change in bowel habits GU:  No frequency Integumentary:  no abnormal skin lesions reported Neurologic:  no headaches Endocrine:  denies unexplained weight changes  Exam BP 120/72 (BP Location: Left Arm, Patient Position: Sitting, Cuff Size: Large)   Pulse 71   Temp 97.7 F (36.5 C) (Oral)   Ht '6\' 1"'$  (1.854 m)  Wt 261 lb 6 oz (118.6 kg)   SpO2 94%   BMI 34.48 kg/m  General:  well developed, well nourished, in no apparent distress Skin:  no significant moles, warts, or growths Head:  no masses, lesions, or tenderness Eyes:  pupils equal and round, sclera anicteric without injection Ears:  canals without lesions, TMs shiny without retraction, no obvious effusion, no erythema Nose:  nares patent, mucosa normal Throat/Pharynx:  lips and gingiva without lesion; tongue and uvula midline; non-inflamed pharynx; no exudates or postnasal drainage Lungs:  clear to auscultation, breath sounds equal bilaterally, no respiratory distress Cardio:  regular rate and rhythm, no LE edema or bruits Rectal: Deferred GI: BS+, S, NT, ND, no masses or organomegaly Musculoskeletal:  symmetrical muscle groups  noted without atrophy or deformity Neuro:  gait normal; deep tendon reflexes normal and symmetric Psych: well oriented with normal range of affect and appropriate judgment/insight  Assessment and Plan  Well adult exam  Diabetes mellitus type 2 in obese ([place]) - Plan: Comprehensive metabolic panel, CBC, Lipid panel, Hemoglobin A1c, Semaglutide, 1 MG/DOSE, 4 MG/3ML SOPN   Well 67 y.o. male. Counseled on diet and exercise. Advanced directive form provided today.  Other orders as above. Patient has a history of Guillain-Barr syndrome after vaccination.  He is very hesitant to get the tetanus booster or pneumonia shot.  I did recommend if he was getting only 1, would recommend PCV 20. Follow up in 6 mo for diabetic visit and AWV.  The patient voiced understanding and agreement to the plan.  [place], DO 03/07/22 9:15 AM

## 2022-03-08 ENCOUNTER — Encounter: Payer: Self-pay | Admitting: Family Medicine

## 2022-03-13 ENCOUNTER — Encounter: Payer: Self-pay | Admitting: Family Medicine

## 2022-03-17 ENCOUNTER — Telehealth: Payer: Self-pay | Admitting: Family Medicine

## 2022-03-17 NOTE — Telephone Encounter (Signed)
Received letter that patient was approved for the [employer] Patient Assistance program. Approval through 01/09/2023. A 4 month's supply of the medicine requested is being shipped to our office and should be received within 10-14 business days. Patient made aware.

## 2022-03-23 NOTE — Telephone Encounter (Signed)
Patient's ozempic came in today , placed in fridge

## 2022-03-24 NOTE — Telephone Encounter (Signed)
Patient contacted ozempic has arrived in our office and he can pickup in our office at his convenience.

## 2022-04-03 ENCOUNTER — Other Ambulatory Visit: Payer: Self-pay | Admitting: Family Medicine

## 2022-04-13 ENCOUNTER — Encounter: Payer: Self-pay | Admitting: Podiatry

## 2022-04-13 ENCOUNTER — Ambulatory Visit: Payer: Medicare HMO | Admitting: Podiatry

## 2022-04-13 VITALS — BP 130/67 | HR 71

## 2022-04-13 DIAGNOSIS — E1165 Type 2 diabetes mellitus with hyperglycemia: Secondary | ICD-10-CM | POA: Diagnosis not present

## 2022-04-13 DIAGNOSIS — B351 Tinea unguium: Secondary | ICD-10-CM | POA: Diagnosis not present

## 2022-04-13 DIAGNOSIS — G629 Polyneuropathy, unspecified: Secondary | ICD-10-CM | POA: Diagnosis not present

## 2022-04-13 DIAGNOSIS — M79675 Pain in left toe(s): Secondary | ICD-10-CM

## 2022-04-13 DIAGNOSIS — G610 Guillain-Barre syndrome: Secondary | ICD-10-CM

## 2022-04-13 DIAGNOSIS — M79674 Pain in right toe(s): Secondary | ICD-10-CM | POA: Diagnosis not present

## 2022-04-13 DIAGNOSIS — I739 Peripheral vascular disease, unspecified: Secondary | ICD-10-CM | POA: Diagnosis not present

## 2022-04-13 NOTE — Progress Notes (Signed)
  Subjective:  Patient ID: Jon Townsend, male    DOB: 05/25/1955,  MRN: [id number]  Kainoa Berland presents to clinic today for at risk foot care with history of peripheral neuropathy and painful elongated mycotic toenails 1-5 bilaterally which are tender when wearing enclosed shoe gear. Pain is relieved with periodic professional debridement.  Chief Complaint  Patient presents with   diabetic foot care   New problem(s): None.   PCP is Sharlene Dory, DO.  Allergies  Allergen Reactions   Venlafaxine Other (See Comments)    Caused Insomnia    Review of Systems: Negative except as noted in the HPI.  Objective: No changes noted in today's physical examination.  Vitals:   04/13/22 0849  BP: 130/67  Pulse: 71   Ekin Bardo is a pleasant 67 y.o. male morbidly obese in NAD. AAO x 3.  Vascular Examination: Capillary refill time immediate b/l.Vascular status intact b/l with palpable pedal pulses. Pedal hair present b/l. No edema. No pain with calf compression b/l. Skin temperature gradient WNL b/l.   Neurological Examination: Sensation grossly intact b/l with 10 gram monofilament. Vibratory sensation intact b/l. Pt has subjective symptoms of neuropathy.  Dermatological Examination: Pedal skin with normal turgor, texture and tone b/l. Toenails 1-5 b/l thick, discolored, elongated with subungual debris and pain on dorsal palpation. No hyperkeratotic nor porokeratotic lesions present on today's visit.  Musculoskeletal Examination: Muscle strength 4/5 to all lower extremity muscle groups bilaterally. Hammertoe deformity noted 2-5 b/l.  Radiographs: None  Assessment/Plan: 1. Pain due to onychomycosis of toenails of both feet   2. PVD (peripheral vascular disease)   3. Guillain Barr syndrome   4. Polyneuropathy   5. Controlled type 2 diabetes mellitus with hyperglycemia, without long-term current use of insulin     No orders of the defined types were placed in this  encounter.  -Consent given for treatment as described below: -Examined patient. -Continue foot and shoe inspections daily. Monitor blood glucose per PCP/Endocrinologist's recommendations. -Continue supportive shoe gear daily. -Toenails 1-5 b/l were debrided in length and girth with sterile nail nippers and dremel without iatrogenic bleeding.  -Patient/POA to call should there be question/concern in the interim.   Return in about 3 months (around 07/13/2022).  Freddie Breech, DPM

## 2022-04-21 ENCOUNTER — Telehealth: Payer: Self-pay | Admitting: Family Medicine

## 2022-04-21 NOTE — Telephone Encounter (Signed)
Contacted [employer] to schedule their annual wellness visit. Appointment made for 05/03/2022.  Jon Townsend; Care Guide Ambulatory Clinical Support [place] [facility] Direct Dial: [phone]

## 2022-04-30 ENCOUNTER — Encounter: Payer: Self-pay | Admitting: Family Medicine

## 2022-05-03 ENCOUNTER — Ambulatory Visit (INDEPENDENT_AMBULATORY_CARE_PROVIDER_SITE_OTHER): Payer: Medicare HMO | Admitting: *Deleted

## 2022-05-03 DIAGNOSIS — Z0000 Encounter for general adult medical examination without abnormal findings: Secondary | ICD-10-CM

## 2022-05-03 NOTE — Patient Instructions (Signed)
[name] , Thank you for taking time to come for your Medicare Wellness Visit. I appreciate your ongoing commitment to your health goals. Please review the following plan we discussed and let me know if I can assist you in the future.     This is a list of the screening recommended for you and due dates:  Health Maintenance  Topic Date Due   DTaP/Tdap/Td vaccine (1 - Tdap) Never done   Pneumonia Vaccine (2 of 2 - PCV) 01/24/2019   Yearly kidney health urinalysis for diabetes  08/31/2022   Hemoglobin A1C  09/05/2022   Eye exam for diabetics  11/04/2022   Colon Cancer Screening  02/14/2023   Yearly kidney function blood test for diabetes  03/08/2023   Complete foot exam   04/13/2023   Medicare Annual Wellness Visit  05/03/2023   Hepatitis C Screening: USPSTF Recommendation to screen - Ages 18-79 yo.  Completed   Zoster (Shingles) Vaccine  Completed   HPV Vaccine  Aged Out   Flu Shot  Discontinued   COVID-19 Vaccine  Discontinued    Next appointment: Follow up in one year for your annual wellness visit.   Preventive Care 25 Years and Older, Male Preventive care refers to lifestyle choices and visits with your health care provider that can promote health and wellness. What does preventive care include? A yearly physical exam. This is also called an annual well check. Dental exams once or twice a year. Routine eye exams. Ask your health care provider how often you should have your eyes checked. Personal lifestyle choices, including: Daily care of your teeth and gums. Regular physical activity. Eating a healthy diet. Avoiding tobacco and drug use. Limiting alcohol use. Practicing safe sex. Taking low doses of aspirin every day. Taking vitamin and mineral supplements as recommended by your health care provider. What happens during an annual well check? The services and screenings done by your health care provider during your annual well check will depend on your age, overall  health, lifestyle risk factors, and family history of disease. Counseling  Your health care provider may ask you questions about your: Alcohol use. Tobacco use. Drug use. Emotional well-being. Home and relationship well-being. Sexual activity. Eating habits. History of falls. Memory and ability to understand (cognition). Work and work Astronomer. Screening  You may have the following tests or measurements: Height, weight, and BMI. Blood pressure. Lipid and cholesterol levels. These may be checked every 5 years, or more frequently if you are over 43 years old. Skin check. Lung cancer screening. You may have this screening every year starting at age 43 if you have a 30-pack-year history of smoking and currently smoke or have quit within the past 15 years. Fecal occult blood test (FOBT) of the stool. You may have this test every year starting at age 67. Flexible sigmoidoscopy or colonoscopy. You may have a sigmoidoscopy every 5 years or a colonoscopy every 10 years starting at age 31. Prostate cancer screening. Recommendations will vary depending on your family history and other risks. Hepatitis C blood test. Hepatitis B blood test. Sexually transmitted disease (STD) testing. Diabetes screening. This is done by checking your blood sugar (glucose) after you have not eaten for a while (fasting). You may have this done every 1-3 years. Abdominal aortic aneurysm (AAA) screening. You may need this if you are a current or former smoker. Osteoporosis. You may be screened starting at age 65 if you are at high risk. Talk with your health care  provider about your test results, treatment options, and if necessary, the need for more tests. Vaccines  Your health care provider may recommend certain vaccines, such as: Influenza vaccine. This is recommended every year. Tetanus, diphtheria, and acellular pertussis (Tdap, Td) vaccine. You may need a Td booster every 10 years. Zoster vaccine. You may  need this after age 74. Pneumococcal 13-valent conjugate (PCV13) vaccine. One dose is recommended after age 28. Pneumococcal polysaccharide (PPSV23) vaccine. One dose is recommended after age 61. Talk to your health care provider about which screenings and vaccines you need and how often you need them. This information is not intended to replace advice given to you by your health care provider. Make sure you discuss any questions you have with your health care provider. Document Released: 01/22/2015 Document Revised: 09/15/2015 Document Reviewed: 10/27/2014 Elsevier Interactive Patient Education  2017 [employer] Prevention in the Home Falls can cause injuries. They can happen to people of all ages. There are many things you can do to make your home safe and to help prevent falls. What can I do on the outside of my home? Regularly fix the edges of walkways and driveways and fix any cracks. Remove anything that might make you trip as you walk through a door, such as a raised step or threshold. Trim any bushes or trees on the path to your home. Use bright outdoor lighting. Clear any walking paths of anything that might make someone trip, such as rocks or tools. Regularly check to see if handrails are loose or broken. Make sure that both sides of any steps have handrails. Any raised decks and porches should have guardrails on the edges. Have any leaves, snow, or ice cleared regularly. Use sand or salt on walking paths during winter. Clean up any spills in your garage right away. This includes oil or grease spills. What can I do in the bathroom? Use night lights. Install grab bars by the toilet and in the tub and shower. Do not use towel bars as grab bars. Use non-skid mats or decals in the tub or shower. If you need to sit down in the shower, use a plastic, non-slip stool. Keep the floor dry. Clean up any water that spills on the floor as soon as it happens. Remove soap buildup in  the tub or shower regularly. Attach bath mats securely with double-sided non-slip rug tape. Do not have throw rugs and other things on the floor that can make you trip. What can I do in the bedroom? Use night lights. Make sure that you have a light by your bed that is easy to reach. Do not use any sheets or blankets that are too big for your bed. They should not hang down onto the floor. Have a firm chair that has side arms. You can use this for support while you get dressed. Do not have throw rugs and other things on the floor that can make you trip. What can I do in the kitchen? Clean up any spills right away. Avoid walking on wet floors. Keep items that you use a lot in easy-to-reach places. If you need to reach something above you, use a strong step stool that has a grab bar. Keep electrical cords out of the way. Do not use floor polish or wax that makes floors slippery. If you must use wax, use non-skid floor wax. Do not have throw rugs and other things on the floor that can make you trip. What can I  do with my stairs? Do not leave any items on the stairs. Make sure that there are handrails on both sides of the stairs and use them. Fix handrails that are broken or loose. Make sure that handrails are as long as the stairways. Check any carpeting to make sure that it is firmly attached to the stairs. Fix any carpet that is loose or worn. Avoid having throw rugs at the top or bottom of the stairs. If you do have throw rugs, attach them to the floor with carpet tape. Make sure that you have a light switch at the top of the stairs and the bottom of the stairs. If you do not have them, ask someone to add them for you. What else can I do to help prevent falls? Wear shoes that: Do not have high heels. Have rubber bottoms. Are comfortable and fit you well. Are closed at the toe. Do not wear sandals. If you use a stepladder: Make sure that it is fully opened. Do not climb a closed  stepladder. Make sure that both sides of the stepladder are locked into place. Ask someone to hold it for you, if possible. Clearly mark and make sure that you can see: Any grab bars or handrails. First and last steps. Where the edge of each step is. Use tools that help you move around (mobility aids) if they are needed. These include: Canes. Walkers. Scooters. Crutches. Turn on the lights when you go into a dark area. Replace any light bulbs as soon as they burn out. Set up your furniture so you have a clear path. Avoid moving your furniture around. If any of your floors are uneven, fix them. If there are any pets around you, be aware of where they are. Review your medicines with your doctor. Some medicines can make you feel dizzy. This can increase your chance of falling. Ask your doctor what other things that you can do to help prevent falls. This information is not intended to replace advice given to you by your health care provider. Make sure you discuss any questions you have with your health care provider. Document Released: 10/22/2008 Document Revised: 06/03/2015 Document Reviewed: 01/30/2014 Elsevier Interactive Patient Education  2017 [employer].

## 2022-05-03 NOTE — Progress Notes (Signed)
Subjective:  Pt completed ADLs, Fall risk, and SDOH during e-check in on 05/02/22. Answers verified with pt.    Jon Townsend is a 67 y.o. male who presents for Medicare Annual/Subsequent preventive examination.  I connected with  Jon Townsend on 05/03/22 by a audio enabled telemedicine application and verified that I am speaking with the correct person using two identifiers.  Patient Location: Home  Provider Location: Office/Clinic  I discussed the limitations of evaluation and management by telemedicine. The patient expressed understanding and agreed to proceed.   Review of Systems     Cardiac Risk Factors include: advanced age (>4men, >77 women);male gender;diabetes mellitus;dyslipidemia;hypertension     Objective:    There were no vitals filed for this visit. There is no height or weight on file to calculate BMI.     05/03/2022    1:40 PM 04/22/2021    1:58 PM 02/19/2020    2:53 PM 02/03/2020   12:30 PM  Advanced Directives  Does Patient Have a Medical Advance Directive? No No No No  Would patient like information on creating a medical advance directive? No - Patient declined No - Patient declined No - Patient declined No - Patient declined    Current Medications (verified) Outpatient Encounter Medications as of 05/03/2022  Medication Sig   atorvastatin (LIPITOR) 40 MG tablet Take 1 tablet (40 mg total) by mouth daily.   Coenzyme Q10 (COQ10) 100 MG CAPS Take 100 mg by mouth in the morning.   fexofenadine (ALLEGRA) 180 MG tablet Take 180 mg by mouth in the morning.   fluticasone (FLONASE) 50 MCG/ACT nasal spray Place 2 sprays into both nostrils daily.   gabapentin (NEURONTIN) 300 MG capsule Take 1 capsule (300 mg total) by mouth 3 (three) times daily.   glucose blood (ONETOUCH VERIO) test strip Use as once daily to check blood sugar.   Krill Oil 1000 MG CAPS Take 1,000 mg by mouth daily.   latanoprost (XALATAN) 0.005 % ophthalmic solution Place 1 drop into both  eyes at bedtime.   lisinopril-hydrochlorothiazide (ZESTORETIC) 10-12.5 MG tablet Take 1 tablet by mouth daily.   magnesium oxide (MAG-OX) 400 (241.3 Mg) MG tablet Take 1 tablet (400 mg total) by mouth daily.   metFORMIN (GLUCOPHAGE-XR) 500 MG 24 hr tablet TAKE 2 TABLETS BY MOUTH ONCE DAILY WITH BREAKFAST   OneTouch Delica Lancets 33G MISC Check blood sugars once daily   Semaglutide, 1 MG/DOSE, 4 MG/3ML SOPN Inject 1 mg as directed once a week.   tamsulosin (FLOMAX) 0.4 MG CAPS capsule Take 1 capsule (0.4 mg total) by mouth daily.   vitamin C (ASCORBIC ACID) 500 MG tablet Take 500-1,000 mg by mouth daily.   No facility-administered encounter medications on file as of 05/03/2022.    Allergies (verified) Venlafaxine   History: Past Medical History:  Diagnosis Date   Allergy    BPH (benign prostatic hyperplasia)    Diabetes mellitus type 2 in obese    Glaucoma    Hypertension    Past Surgical History:  Procedure Laterality Date   Broken finger     TONSILLECTOMY     Family History  Problem Relation Age of Onset   Cancer Father    Early death Father    Diabetes Sister    Heart disease Sister    COPD Sister    Diabetes Sister    Colon cancer Neg Hx    Esophageal cancer Neg Hx    Stomach cancer Neg Hx    Rectal cancer  Neg Hx    Social History   Socioeconomic History   Marital status: Married    Spouse name: Rosey Bath   Number of children: 1   Years of education: Not on file   Highest education level: Not on file  Occupational History   Not on file  Tobacco Use   Smoking status: Former   Smokeless tobacco: Never   Tobacco comments:    Quit 35 years ago  Substance and Sexual Activity   Alcohol use: Never   Drug use: Never   Sexual activity: Not on file  Other Topics Concern   Not on file  Social History Narrative   Lives in [place] with wife, Rosey Bath.   1 daughter, 1 grandson   Social Determinants of Corporate investment banker Strain: Low Risk  (05/02/2022)    Overall Financial Resource Strain (CARDIA)    Difficulty of Paying Living Expenses: Not hard at all  Food Insecurity: No Food Insecurity (05/02/2022)   Hunger Vital Sign    Worried About Running Out of Food in the Last Year: Never true    Ran Out of Food in the Last Year: Never true  Transportation Needs: No Transportation Needs (05/02/2022)   PRAPARE - Administrator, Civil Service (Medical): No    Lack of Transportation (Non-Medical): No  Physical Activity: Sufficiently Active (05/02/2022)   Exercise Vital Sign    Days of Exercise per Week: 3 days    Minutes of Exercise per Session: 60 min  Stress: No Stress Concern Present (05/02/2022)   [employer] of Occupational Health - Occupational Stress Questionnaire    Feeling of Stress : Not at all  Social Connections: Unknown (05/02/2022)   Social Connection and Isolation Panel [NHANES]    Frequency of Communication with Friends and Family: More than three times a week    Frequency of Social Gatherings with Friends and Family: Patient declined    Attends Religious Services: Not on Insurance claims handler of Clubs or Organizations: No    Attends Banker Meetings: Never    Marital Status: Married    Tobacco Counseling Counseling given: Not Answered Tobacco comments: Quit 35 years ago   Clinical Intake:  Pre-visit preparation completed: Yes  Pain : No/denies pain  Nutritional Risks: None Diabetes: Yes CBG done?: No Did pt. bring in CBG monitor from home?: No  How often do you need to have someone help you when you read instructions, pamphlets, or other written materials from your doctor or pharmacy?: 1 - Never  Activities of Daily Living    05/02/2022    1:00 PM  In your present state of health, do you have any difficulty performing the following activities:  Hearing? 0  Vision? 0  Difficulty concentrating or making decisions? 0  Walking or climbing stairs? 0  Dressing or bathing? 0  Doing  errands, shopping? 0  Preparing Food and eating ? N  Using the Toilet? N  In the past six months, have you accidently leaked urine? N  Do you have problems with loss of bowel control? N  Managing your Medications? N  Managing your Finances? N  Housekeeping or managing your Housekeeping? N    Patient Care Team: Sharlene Dory, DO as PCP - General (Family Medicine)  Indicate any recent Medical Services you may have received from other than [facility] providers in the past year (date may be approximate).     Assessment:   This is a routine  wellness examination for [facility].  Hearing/Vision screen No results found.  Dietary issues and exercise activities discussed: Current Exercise Habits: Home exercise routine, Type of exercise: strength training/weights;treadmill, Time (Minutes): 60, Frequency (Times/Week): 3, Weekly Exercise (Minutes/Week): 180, Intensity: Moderate, Exercise limited by: None identified   Goals Addressed   None    Depression Screen    05/03/2022    1:42 PM 04/22/2021    1:56 PM 03/08/2021    9:14 AM 09/07/2020    9:26 AM 08/20/2019    9:38 AM  PHQ 2/9 Scores  PHQ - 2 Score 0 0 0 0 0    Fall Risk    05/02/2022    1:00 PM 03/07/2022    9:22 AM 04/22/2021    1:59 PM 03/08/2021    9:13 AM 09/07/2020    9:26 AM  Fall Risk   Falls in the past year? 0 0 0 0 0  Number falls in past yr: 0 0 0 0 0  Injury with Fall? 0 0 0 0 0  Risk for fall due to : No Fall Risks  No Fall Risks No Fall Risks No Fall Risks  Follow up Falls evaluation completed  Falls prevention discussed Falls evaluation completed Falls evaluation completed    FALL RISK PREVENTION PERTAINING TO THE HOME:  Any stairs in or around the home? Yes  If so, are there any without handrails? No  Home free of loose throw rugs in walkways, pet beds, electrical cords, etc? Yes  Adequate lighting in your home to reduce risk of falls? Yes   ASSISTIVE DEVICES UTILIZED TO PREVENT FALLS:  Life alert? No   Use of a cane, walker or w/c? No  Grab bars in the bathroom? Yes  Shower chair or bench in shower?  Built in seat Elevated toilet seat or a handicapped toilet?  Comfort height  TIMED UP AND GO:  Was the test performed?  No, audio visit .    Cognitive Function:        05/03/2022    1:46 PM 04/22/2021    2:03 PM  6CIT Screen  What Year? 0 points 0 points  What month? 0 points 0 points  What time? 0 points 0 points  Count back from 20 0 points 0 points  Months in reverse 0 points 0 points  Repeat phrase 0 points 0 points  Total Score 0 points 0 points    Immunizations Immunization History  Administered Date(s) Administered   Influenza, Seasonal, Injecte, Preservative Fre 10/04/2016   Influenza,inj,Quad PF,6+ Mos 10/20/2018   Influenza,inj,quad, With Preservative 09/29/2017   Influenza-Unspecified 10/20/2018   Pneumococcal Polysaccharide-23 01/23/2018   Zoster Recombinat (Shingrix) 04/26/2017, 07/04/2017    TDAP status: Due, Education has been provided regarding the importance of this vaccine. Advised may receive this vaccine at local pharmacy or Health Dept. Aware to provide a copy of the vaccination record if obtained from local pharmacy or Health Dept. Verbalized acceptance and understanding.  Flu Vaccine status: Declined, Education has been provided regarding the importance of this vaccine but patient still declined. Advised may receive this vaccine at local pharmacy or Health Dept. Aware to provide a copy of the vaccination record if obtained from local pharmacy or Health Dept. Verbalized acceptance and understanding.  Pneumococcal vaccine status: Due, Education has been provided regarding the importance of this vaccine. Advised may receive this vaccine at local pharmacy or Health Dept. Aware to provide a copy of the vaccination record if obtained from local pharmacy or Health Dept.  Verbalized acceptance and understanding.  Covid-19 vaccine status: Declined, Education has  been provided regarding the importance of this vaccine but patient still declined. Advised may receive this vaccine at local pharmacy or Health Dept.or vaccine clinic. Aware to provide a copy of the vaccination record if obtained from local pharmacy or Health Dept. Verbalized acceptance and understanding.  Qualifies for Shingles Vaccine? Yes   Zostavax completed No   Shingrix Completed?: Yes  Screening Tests Health Maintenance  Topic Date Due   DTaP/Tdap/Td (1 - Tdap) Never done   Pneumonia Vaccine 65+ Years old (2 of 2 - PCV) 01/24/2019   Medicare Annual Wellness (AWV)  04/23/2022   Diabetic kidney evaluation - Urine ACR  08/31/2022   HEMOGLOBIN A1C  09/05/2022   OPHTHALMOLOGY EXAM  11/04/2022   COLONOSCOPY (Pts 45-63yrs Insurance coverage will need to be confirmed)  02/14/2023   Diabetic kidney evaluation - eGFR measurement  03/08/2023   FOOT EXAM  04/13/2023   Hepatitis C Screening  Completed   Zoster Vaccines- Shingrix  Completed   HPV VACCINES  Aged Out   INFLUENZA VACCINE  Discontinued   COVID-19 Vaccine  Discontinued    Health Maintenance  Health Maintenance Due  Topic Date Due   DTaP/Tdap/Td (1 - Tdap) Never done   Pneumonia Vaccine 27+ Years old (2 of 2 - PCV) 01/24/2019   Medicare Annual Wellness (AWV)  04/23/2022    Colorectal cancer screening: Type of screening: Colonoscopy. Completed 02/13/18. Repeat every 5 years  Lung Cancer Screening: (Low Dose CT Chest recommended if Age 52-80 years, 30 pack-year currently smoking OR have quit w/in 15years.) does not qualify.   Additional Screening:  Hepatitis C Screening: does qualify; Completed 01/14/17  Vision Screening: Recommended annual ophthalmology exams for early detection of glaucoma and other disorders of the eye. Is the patient up to date with their annual eye exam?  Yes  Who is the provider or what is the name of the office in which the patient attends annual eye exams? [facility] If pt is not established with  a provider, would they like to be referred to a provider to establish care? No .   Dental Screening: Recommended annual dental exams for proper oral hygiene  Community Resource Referral / Chronic Care Management: CRR required this visit?  No   CCM required this visit?  No      Plan:     I have personally reviewed and noted the following in the patient's chart:   Medical and social history Use of alcohol, tobacco or illicit drugs  Current medications and supplements including opioid prescriptions. Patient is not currently taking opioid prescriptions. Functional ability and status Nutritional status Physical activity Advanced directives List of other physicians Hospitalizations, surgeries, and ER visits in previous 12 months Vitals Screenings to include cognitive, depression, and falls Referrals and appointments  In addition, I have reviewed and discussed with patient certain preventive protocols, quality metrics, and best practice recommendations. A written personalized care plan for preventive services as well as general preventive health recommendations were provided to patient.   Due to this being a telephonic visit, the after visit summary with patients personalized plan was offered to patient via mail or my-chart. Patient would like to access on my-chart.  Donne Anon, [place]   05/03/2022   Nurse Notes: None

## 2022-06-19 ENCOUNTER — Encounter: Payer: Self-pay | Admitting: Family Medicine

## 2022-06-22 ENCOUNTER — Ambulatory Visit: Payer: Medicare HMO | Admitting: Orthopaedic Surgery

## 2022-06-22 ENCOUNTER — Other Ambulatory Visit (INDEPENDENT_AMBULATORY_CARE_PROVIDER_SITE_OTHER): Payer: Medicare HMO

## 2022-06-22 ENCOUNTER — Encounter: Payer: Self-pay | Admitting: Orthopaedic Surgery

## 2022-06-22 DIAGNOSIS — G8929 Other chronic pain: Secondary | ICD-10-CM | POA: Diagnosis not present

## 2022-06-22 DIAGNOSIS — M25562 Pain in left knee: Secondary | ICD-10-CM

## 2022-06-22 MED ORDER — METHYLPREDNISOLONE ACETATE 40 MG/ML IJ SUSP
40.0000 mg | INTRAMUSCULAR | Status: AC | PRN
Start: 2022-06-22 — End: 2022-06-22
  Administered 2022-06-22: 40 mg via INTRA_ARTICULAR

## 2022-06-22 MED ORDER — LIDOCAINE HCL 1 % IJ SOLN
2.0000 mL | INTRAMUSCULAR | Status: AC | PRN
Start: 2022-06-22 — End: 2022-06-22
  Administered 2022-06-22: 2 mL

## 2022-06-22 MED ORDER — BUPIVACAINE HCL 0.5 % IJ SOLN
2.0000 mL | INTRAMUSCULAR | Status: AC | PRN
Start: 2022-06-22 — End: 2022-06-22
  Administered 2022-06-22: 2 mL via INTRA_ARTICULAR

## 2022-06-22 NOTE — Progress Notes (Signed)
Office Visit Note   Patient: Jon Townsend           Date of Birth: 02/18/1955           MRN: [id number] Visit Date: 06/22/2022              Requested by: Sharlene Dory, DO [address] [place],  [place] [address] PCP: Sharlene Dory, DO   Assessment & Plan: Visit Diagnoses:  1. Chronic pain of left knee     Plan: Impression is a symptomatic left knee osteoarthritis.  Recommend continue to use Voltaren gel and a knee brace during activity.  He would like to have cortisone injection today which he tolerated well.  Will see him back as needed.  Follow-Up Instructions: No follow-ups on file.   Orders:  Orders Placed This Encounter  Procedures   Large Joint Inj   XR KNEE 3 VIEW LEFT   No orders of the defined types were placed in this encounter.     Procedures: Large Joint Inj: L knee on 06/22/2022 8:30 AM Details: 22 G needle Medications: 2 mL bupivacaine 0.5 %; 2 mL lidocaine 1 %; 40 mg methylPREDNISolone acetate 40 MG/ML Outcome: tolerated well, no immediate complications Patient was prepped and draped in the usual sterile fashion.       Clinical Data: No additional findings.   Subjective: Chief Complaint  Patient presents with   Left Knee - Pain    HPI Jon Townsend is a very pleasant 67 year old gentleman who is here for evaluation of left knee pain for about 3 weeks.  He reports posterior and anterior knee pain with giving way.  Denies any grinding.  Rest does help the pain.  Denies any injuries or trauma. Review of Systems  Constitutional: Negative.   HENT: Negative.    Eyes: Negative.   Respiratory: Negative.    Cardiovascular: Negative.   Gastrointestinal: Negative.   Endocrine: Negative.   Genitourinary: Negative.   Skin: Negative.   Allergic/Immunologic: Negative.   Neurological: Negative.   Hematological: Negative.   Psychiatric/Behavioral: Negative.    All other systems reviewed and are  negative.    Objective: Vital Signs: There were no vitals taken for this visit.  Physical Exam Vitals and nursing note reviewed.  Constitutional:      Appearance: He is well-developed.  HENT:     Head: Normocephalic and atraumatic.  Eyes:     Pupils: Pupils are equal, round, and reactive to light.  Pulmonary:     Effort: Pulmonary effort is normal.  Abdominal:     Palpations: Abdomen is soft.  Musculoskeletal:        General: Normal range of motion.     Cervical back: Neck supple.  Skin:    General: Skin is warm.  Neurological:     Mental Status: He is alert and oriented to person, place, and time.  Psychiatric:        Behavior: Behavior normal.        Thought Content: Thought content normal.        Judgment: Judgment normal.     Ortho Exam Examination left knee shows trace effusion.  1+ patellofemoral crepitus.  Collaterals and cruciates are stable. Specialty Comments:  No specialty comments available.  Imaging: XR KNEE 3 VIEW LEFT  Result Date: 06/22/2022 Three-view x-rays of the left knee shows mild to moderate tricompartment osteoarthritis.  There is about 50% joint space narrowing of the medial compartment.    PMFS History:  Patient Active Problem List   Diagnosis Date Noted   PVD (peripheral vascular disease) (HCC) 03/08/2021   Acute blood loss anemia    Bloody stool    Controlled type 2 diabetes mellitus with hyperglycemia, without long-term current use of insulin (HCC)    Weakness 02/03/2020   Essential hypertension 02/03/2020   Polyneuropathy 02/03/2020   COVID-19 virus infection 02/03/2020   Diabetes mellitus type 2 in obese    BPH (benign prostatic hyperplasia)    Chronic right-sided low back pain without sciatica 04/03/2016   H/O urinary frequency 04/03/2016   Annual physical exam 02/03/2016   Low testosterone level in male 01/15/2015   ADD (attention deficit disorder) 01/14/2015   Class 1 obesity in adult 01/14/2015   Elevated BP without  diagnosis of hypertension 01/14/2015   Lumbar paraspinal muscle spasm 01/14/2015   Rosacea 01/14/2015   Past Medical History:  Diagnosis Date   Allergy    BPH (benign prostatic hyperplasia)    Diabetes mellitus type 2 in obese    Glaucoma    Hypertension     Family History  Problem Relation Age of Onset   Cancer Father    Early death Father    Diabetes Sister    Heart disease Sister    COPD Sister    Diabetes Sister    Colon cancer Neg Hx    Esophageal cancer Neg Hx    Stomach cancer Neg Hx    Rectal cancer Neg Hx     Past Surgical History:  Procedure Laterality Date   Broken finger     TONSILLECTOMY     Social History   Occupational History   Not on file  Tobacco Use   Smoking status: Former   Smokeless tobacco: Never   Tobacco comments:    Quit 35 years ago  Substance and Sexual Activity   Alcohol use: Never   Drug use: Never   Sexual activity: Not on file

## 2022-06-25 ENCOUNTER — Other Ambulatory Visit: Payer: Self-pay | Admitting: Family Medicine

## 2022-07-03 ENCOUNTER — Telehealth: Payer: Self-pay | Admitting: Podiatry

## 2022-07-03 ENCOUNTER — Other Ambulatory Visit: Payer: Self-pay | Admitting: Family Medicine

## 2022-07-03 ENCOUNTER — Encounter: Payer: Self-pay | Admitting: Family Medicine

## 2022-07-03 DIAGNOSIS — E669 Obesity, unspecified: Secondary | ICD-10-CM

## 2022-07-03 MED ORDER — SEMAGLUTIDE (1 MG/DOSE) 4 MG/3ML ~~LOC~~ SOPN: 1.0000 mg | PEN_INJECTOR | SUBCUTANEOUS | 0 refills | Status: DC

## 2022-07-03 NOTE — Telephone Encounter (Signed)
LVM for patient to call back or reschedule.

## 2022-07-06 ENCOUNTER — Ambulatory Visit: Payer: Medicare HMO | Admitting: Podiatry

## 2022-07-07 ENCOUNTER — Ambulatory Visit: Payer: Medicare HMO | Admitting: Podiatry

## 2022-07-10 ENCOUNTER — Telehealth: Payer: Self-pay | Admitting: Family Medicine

## 2022-07-10 NOTE — Telephone Encounter (Signed)
Received Patient assistance for Ozempic 1 mg. #4 boxes Lot #[id number] Expiration date 12/08/2024  Called the patient and he will pickup at our office Is in the frig.

## 2022-07-19 ENCOUNTER — Ambulatory Visit: Payer: Medicare HMO | Admitting: Podiatry

## 2022-07-19 DIAGNOSIS — B351 Tinea unguium: Secondary | ICD-10-CM | POA: Diagnosis not present

## 2022-07-19 DIAGNOSIS — L84 Corns and callosities: Secondary | ICD-10-CM | POA: Diagnosis not present

## 2022-07-19 DIAGNOSIS — E1151 Type 2 diabetes mellitus with diabetic peripheral angiopathy without gangrene: Secondary | ICD-10-CM | POA: Diagnosis not present

## 2022-07-19 NOTE — Progress Notes (Signed)
    Subjective:  Patient ID: Jon Townsend, male    DOB: Jan 25, 1955,  MRN: [id number]  Holten Spano presents to clinic today for:  Chief Complaint  Patient presents with   Nail Problem    Diabetic Foot Care- nail trim    . Patient notes nails are thick and elongated, causing pain in shoe gear when ambulating.  Patient also has calluses near the great toe joint bilateral  PCP is Carmelia Roller, Jilda Roche, DO.  Date last seen was 03/07/2022.  His HbA1c at that time was 6.6.  Allergies  Allergen Reactions   Venlafaxine Other (See Comments)    Caused Insomnia   Review of Systems: Negative except as noted in the HPI.  Objective:  There were no vitals filed for this visit.  Kaymon Denomme is a pleasant 67 y.o. male in NAD. AAO x 3.  Vascular Examination: Patient has palpable DP pulse, absent PT pulse bilateral.  Delayed capillary refill bilateral toes.  Sparse digital hair bilateral.  Proximal to distal cooling WNL bilateral.    Dermatological Examination: Interspaces are clear with no open lesions noted bilateral.  Nails are 3-13mm thick, with yellowish/brown discoloration, subungual debris and distal onycholysis x10.  There is pain with compression of nails x10.  There are hyperkeratotic lesions noted on the plantar medial aspect of the first MPJ bilateral no soft tissue breakdown is seen..  Neurological Examination: Protective sensation intact b/l LE.     Latest Ref Rng & Units 03/07/2022    9:36 AM 08/30/2021    9:32 AM  Hemoglobin A1C  Hemoglobin-A1c 4.6 - 6.5 % 6.6  6.6    Patient qualifies for at-risk foot care because of diabetes with PVD.  Assessment/Plan: 1. Dermatophytosis of nail   2. Type II diabetes mellitus with peripheral circulatory disorder (HCC)   3. Callus of foot    Mycotic nails x10 were sharply debrided with sterile nail nippers and power debriding burr to decrease bulk and length.  Hyperkeratotic lesions were shaved with #312 blade.  Return in  about 3 months (around 10/19/2022) for [facility].   Clerance Lav, DPM, FACFAS [facility]     [address] [place], [place] [address]                Office [phone]  Fax [phone]

## 2022-07-20 ENCOUNTER — Encounter: Payer: Self-pay | Admitting: Podiatry

## 2022-08-09 ENCOUNTER — Encounter (INDEPENDENT_AMBULATORY_CARE_PROVIDER_SITE_OTHER): Payer: Self-pay

## 2022-09-01 ENCOUNTER — Encounter: Payer: Self-pay | Admitting: Pharmacist

## 2022-09-01 NOTE — Progress Notes (Signed)
Pharmacy Quality Measure Review  This patient is appearing on a report for being at risk of failing the adherence measure for cholesterol (statin) medications this calendar year.   Medication: atorvastatin 40mg  Last fill date: 05/12/2022 for 90 day supply  Called patient's pharmacy [facility] - verified atorvastatin was filled 08/09/2022 for 90 DS and was picked up  Insurance report was not up to date. No action needed at this time.   Henrene Pastor, PharmD Clinical Pharmacist [facility]

## 2022-09-05 ENCOUNTER — Encounter: Payer: Self-pay | Admitting: Family Medicine

## 2022-09-05 ENCOUNTER — Ambulatory Visit (INDEPENDENT_AMBULATORY_CARE_PROVIDER_SITE_OTHER): Payer: Medicare HMO | Admitting: Family Medicine

## 2022-09-05 VITALS — BP 120/80 | HR 74 | Temp 98.6°F | Ht 73.0 in | Wt 254.4 lb

## 2022-09-05 DIAGNOSIS — N401 Enlarged prostate with lower urinary tract symptoms: Secondary | ICD-10-CM | POA: Diagnosis not present

## 2022-09-05 DIAGNOSIS — E1169 Type 2 diabetes mellitus with other specified complication: Secondary | ICD-10-CM

## 2022-09-05 DIAGNOSIS — E669 Obesity, unspecified: Secondary | ICD-10-CM | POA: Diagnosis not present

## 2022-09-05 DIAGNOSIS — E782 Mixed hyperlipidemia: Secondary | ICD-10-CM | POA: Diagnosis not present

## 2022-09-05 DIAGNOSIS — I10 Essential (primary) hypertension: Secondary | ICD-10-CM | POA: Diagnosis not present

## 2022-09-05 DIAGNOSIS — E781 Pure hyperglyceridemia: Secondary | ICD-10-CM | POA: Insufficient documentation

## 2022-09-05 DIAGNOSIS — R350 Frequency of micturition: Secondary | ICD-10-CM

## 2022-09-05 DIAGNOSIS — Z794 Long term (current) use of insulin: Secondary | ICD-10-CM | POA: Diagnosis not present

## 2022-09-05 LAB — COMPREHENSIVE METABOLIC PANEL
ALT: 19 U/L (ref 0–53)
AST: 17 U/L (ref 0–37)
Albumin: 4.1 g/dL (ref 3.5–5.2)
Alkaline Phosphatase: 84 U/L (ref 39–117)
BUN: 18 mg/dL (ref 6–23)
CO2: 31 mEq/L (ref 19–32)
Calcium: 9.1 mg/dL (ref 8.4–10.5)
Chloride: 100 mEq/L (ref 96–112)
Creatinine, Ser: 0.98 mg/dL (ref 0.40–1.50)
GFR: 79.07 mL/min (ref >60.00)
Glucose, Bld: 113 mg/dL — ABNORMAL HIGH (ref 70–99)
Potassium: 4.4 mEq/L (ref 3.5–5.1)
Sodium: 139 mEq/L (ref 135–145)
Total Bilirubin: 1.2 mg/dL (ref 0.2–1.2)
Total Protein: 6.6 g/dL (ref 6.0–8.3)

## 2022-09-05 LAB — LIPID PANEL
Cholesterol: 108 mg/dL (ref 0–200)
HDL: 35 mg/dL — ABNORMAL LOW (ref >39.00)
LDL Cholesterol: 45 mg/dL (ref 0–99)
NonHDL: 72.88
Total CHOL/HDL Ratio: 3
Triglycerides: 137 mg/dL (ref 0.0–149.0)
VLDL: 27.4 mg/dL (ref 0.0–40.0)

## 2022-09-05 LAB — HEMOGLOBIN A1C: Hgb A1c MFr Bld: 6.4 % (ref 4.6–6.5)

## 2022-09-05 LAB — MICROALBUMIN / CREATININE URINE RATIO
Creatinine,U: 30.2 mg/dL
Microalb Creat Ratio: 2.3 mg/g (ref 0.0–30.0)
Microalb, Ur: <0.7 mg/dL (ref 0.0–1.9)

## 2022-09-05 MED ORDER — METFORMIN HCL ER 500 MG PO TB24: ORAL_TABLET | ORAL | 3 refills | Status: DC

## 2022-09-05 MED ORDER — TIZANIDINE HCL 4 MG PO TABS: 4.0000 mg | ORAL_TABLET | Freq: Four times a day (QID) | ORAL | 0 refills | Status: DC | PRN

## 2022-09-05 MED ORDER — LISINOPRIL-HYDROCHLOROTHIAZIDE 10-12.5 MG PO TABS: 1.0000 | ORAL_TABLET | Freq: Every day | ORAL | 3 refills | Status: DC

## 2022-09-05 NOTE — Progress Notes (Signed)
Subjective:   Chief Complaint  Patient presents with   Follow-up    6 month    Jon Townsend is a 67 y.o. male here for follow-up of diabetes.   [name] self monitored glucose range is low 100's.  Patient denies hypoglycemic reactions. He checks his glucose levels intermittently.  Patient does not require insulin.   Medications include: Ozempic 1 mg/week, Metformin XR 1000 mg/d Diet is fair.  Exercise: walking, lifting wts  Hypertension Patient presents for hypertension follow up. He does monitor home blood pressures. Blood pressures ranging on average from 120's/60-70's. He is compliant with medications- Zestoretic 10-12.5 mg/d. Patient has these side effects of medication: none Diet/exercise as above.  No CP or SOB.   Mixed hyperlipidemia Patient presents for mixed hyperlipidemia follow up. Currently being treated with atorvastatin 20 mg daily and compliance with treatment thus far has been good. He denies myalgias. Diet/exercise as above. The patient is not known to have coexisting coronary artery disease.  BPH History of nocturia from BPH.  Improved on Flomax 0.4 mg daily.  He reports compliance and no adverse effects.  He will have some lites that are great where he urinates once or not at all.  He will have some nights where he urinates 3-4 times, which disrupts his sleep.  He is routinely constipated from semaglutide.  Past Medical History:  Diagnosis Date   Allergy    BPH (benign prostatic hyperplasia)    Diabetes mellitus type 2 in obese    Glaucoma    Hypertension      Related testing: Retinal exam: Done Pneumovax: not done  Objective:  BP 120/80 (BP Location: Left Arm, Patient Position: Sitting, Cuff Size: Large)   Pulse 74   Temp 98.6 F (37 C) (Oral)   Ht 6\' 1"  (1.854 m)   Wt 254 lb 6 oz (115.4 kg)   SpO2 94%   BMI 33.56 kg/m  General:  Well developed, well nourished, in no apparent distress Skin:  Warm, no pallor or diaphoresis Head:   Normocephalic, atraumatic Eyes:  Pupils equal and round, sclera anicteric without injection  Lungs:  CTAB, no access msc use Cardio:  RRR, no bruits, no LE edema Musculoskeletal:  Symmetrical muscle groups noted without atrophy or deformity Neuro:  Sensation intact to pinprick on feet Psych: Age appropriate judgment and insight  Assessment:   Type 2 diabetes mellitus with obesity (HCC) - Plan: Lipid panel, Hemoglobin A1c, Microalbumin / creatinine urine ratio, Comprehensive metabolic panel, metFORMIN (GLUCOPHAGE-XR) 500 MG 24 hr tablet  Essential hypertension - Plan: lisinopril-hydrochlorothiazide (ZESTORETIC) 10-12.5 MG tablet  Hypertriglyceridemia  Benign prostatic hyperplasia with urinary frequency   Plan:   Chronic, stable.  Continue metformin XR 1000 mg daily, semaglutide 1 mg weekly.  Counseled on diet and exercise. Chronic, stable.  Continue Zestoretic 10-12.5 mg daily. Chronic, stable.  Continue atorvastatin 20 mg daily. Chronic, relatively stable.  Continue Flomax 0.4 mg daily.  He will make sure his bowels are flowing well and not dealing with too much constipation from the semaglutide.  He will also make sure not to drink too much within 90 minutes of planned bedtime. I did recommend a tetanus and pneumonia vaccine.  He politely declines for now as he had GBS from the flu shot. F/u in 6 mo. The patient voiced understanding and agreement to the plan.  Jon Townsend [place], DO 09/05/22 9:18 AM

## 2022-09-05 NOTE — Patient Instructions (Addendum)
Give [place] 2-3 business days to get the results of your labs back.   Keep the diet clean and stay active.  Consider cutting out fluids within 90 min. Alternatively, you could consider making sure your bowels are moving well.   Heat (pad or rice pillow in microwave) over affected area, 10-15 minutes twice daily.   Ice/cold pack over area for 10-15 min twice daily.  OK to take Tylenol 1000 mg (2 extra strength tabs) or 975 mg (3 regular strength tabs) every 6 hours as needed.  Take the muscle relaxer 1-2 hours before planned bedtime. If it makes you drowsy, do not take during the day. You can try half a tab the following night.  Let us know if you need anything.  EXERCISES RANGE OF MOTION (ROM) AND STRETCHING EXERCISES  These exercises may help you when beginning to rehabilitate your issue. In order to successfully resolve your symptoms, you must improve your posture. These exercises are designed to help reduce the forward-head and rounded-shoulder posture which contributes to this condition. Your symptoms may resolve with or without further involvement from your physician, physical therapist or athletic trainer. While completing these exercises, remember:  Restoring tissue flexibility helps normal motion to return to the joints. This allows healthier, less painful movement and activity. An effective stretch should be held for at least 20 seconds, although you may need to begin with shorter hold times for comfort. A stretch should never be painful. You should only feel a gentle lengthening or release in the stretched tissue. Do not do any stretch or exercise that you cannot tolerate.  STRETCH- Axial Extensors Lie on your back on the floor. You may bend your knees for comfort. Place a rolled-up hand towel or dish towel, about 2 inches in diameter, under the part of your head that makes contact with the floor. Gently tuck your chin, as if trying to make a "double chin," until you feel a gentle  stretch at the base of your head. Hold 15-20 seconds. Repeat 2-3 times. Complete this exercise 1 time per day.   STRETCH - Axial Extension  Stand or sit on a firm surface. Assume a good posture: chest up, shoulders drawn back, abdominal muscles slightly tense, knees unlocked (if standing) and feet hip width apart. Slowly retract your chin so your head slides back and your chin slightly lowers. Continue to look straight ahead. You should feel a gentle stretch in the back of your head. Be certain not to feel an aggressive stretch since this can cause headaches later. Hold for 15-20 seconds. Repeat 2-3 times. Complete this exercise 1 time per day.  STRETCH - Cervical Side Bend  Stand or sit on a firm surface. Assume a good posture: chest up, shoulders drawn back, abdominal muscles slightly tense, knees unlocked (if standing) and feet hip width apart. Without letting your nose or shoulders move, slowly tip your right / left ear to your shoulder until your feel a gentle stretch in the muscles on the opposite side of your neck. Hold 15-20 seconds. Repeat 2-3 times. Complete this exercise 1-2 times per day.  STRETCH - Cervical Rotators  Stand or sit on a firm surface. Assume a good posture: chest up, shoulders drawn back, abdominal muscles slightly tense, knees unlocked (if standing) and feet hip width apart. Keeping your eyes level with the ground, slowly turn your head until you feel a gentle stretch along the back and opposite side of your neck. Hold 15-20 seconds. Repeat 2-3 times. Complete  this exercise 1-2 times per day.  RANGE OF MOTION - Neck Circles  Stand or sit on a firm surface. Assume a good posture: chest up, shoulders drawn back, abdominal muscles slightly tense, knees unlocked (if standing) and feet hip width apart. Gently roll your head down and around from the back of one shoulder to the back of the other. The motion should never be forced or painful. Repeat the motion 10-20  times, or until you feel the neck muscles relax and loosen. Repeat 2-3 times. Complete the exercise 1-2 times per day. STRENGTHENING EXERCISES - Cervical Strain and Sprain These exercises may help you when beginning to rehabilitate your injury. They may resolve your symptoms with or without further involvement from your physician, physical therapist, or athletic trainer. While completing these exercises, remember:  Muscles can gain both the endurance and the strength needed for everyday activities through controlled exercises. Complete these exercises as instructed by your physician, physical therapist, or athletic trainer. Progress the resistance and repetitions only as guided. You may experience muscle soreness or fatigue, but the pain or discomfort you are trying to eliminate should never worsen during these exercises. If this pain does worsen, stop and make certain you are following the directions exactly. If the pain is still present after adjustments, discontinue the exercise until you can discuss the trouble with your clinician.  STRENGTH - Cervical Flexors, Isometric Face a wall, standing about 6 inches away. Place a small pillow, a ball about 6-8 inches in diameter, or a folded towel between your forehead and the wall. Slightly tuck your chin and gently push your forehead into the soft object. Push only with mild to moderate intensity, building up tension gradually. Keep your jaw and forehead relaxed. Hold 10 to 20 seconds. Keep your breathing relaxed. Release the tension slowly. Relax your neck muscles completely before you start the next repetition. Repeat 2-3 times. Complete this exercise 1 time per day.  STRENGTH- Cervical Lateral Flexors, Isometric  Stand about 6 inches away from a wall. Place a small pillow, a ball about 6-8 inches in diameter, or a folded towel between the side of your head and the wall. Slightly tuck your chin and gently tilt your head into the soft object. Push  only with mild to moderate intensity, building up tension gradually. Keep your jaw and forehead relaxed. Hold 10 to 20 seconds. Keep your breathing relaxed. Release the tension slowly. Relax your neck muscles completely before you start the next repetition. Repeat 2-3 times. Complete this exercise 1 time per day.  STRENGTH - Cervical Extensors, Isometric  Stand about 6 inches away from a wall. Place a small pillow, a ball about 6-8 inches in diameter, or a folded towel between the back of your head and the wall. Slightly tuck your chin and gently tilt your head back into the soft object. Push only with mild to moderate intensity, building up tension gradually. Keep your jaw and forehead relaxed. Hold 10 to 20 seconds. Keep your breathing relaxed. Release the tension slowly. Relax your neck muscles completely before you start the next repetition. Repeat 2-3 times. Complete this exercise 1 time per day.  POSTURE AND BODY MECHANICS CONSIDERATIONS Keeping correct posture when sitting, standing or completing your activities will reduce the stress put on different body tissues, allowing injured tissues a chance to heal and limiting painful experiences. The following are general guidelines for improved posture. Your physician or physical therapist will provide you with any instructions specific to your needs. While  reading these guidelines, remember: The exercises prescribed by your provider will help you have the flexibility and strength to maintain correct postures. The correct posture provides the optimal environment for your joints to work. All of your joints have less wear and tear when properly supported by a spine with good posture. This means you will experience a healthier, less painful body. Correct posture must be practiced with all of your activities, especially prolonged sitting and standing. Correct posture is as important when doing repetitive low-stress activities (typing) as it is when  doing a single heavy-load activity (lifting).  PROLONGED STANDING WHILE SLIGHTLY LEANING FORWARD When completing a task that requires you to lean forward while standing in one place for a long time, place either foot up on a stationary 2- to 4-inch high object to help maintain the best posture. When both feet are on the ground, the low back tends to lose its slight inward curve. If this curve flattens (or becomes too large), then the back and your other joints will experience too much stress, fatigue more quickly, and can cause pain.   RESTING POSITIONS Consider which positions are most painful for you when choosing a resting position. If you have pain with flexion-based activities (sitting, bending, stooping, squatting), choose a position that allows you to rest in a less flexed posture. You would want to avoid curling into a fetal position on your side. If your pain worsens with extension-based activities (prolonged standing, working overhead), avoid resting in an extended position such as sleeping on your stomach. Most people will find more comfort when they rest with their spine in a more neutral position, neither too rounded nor too arched. Lying on a non-sagging bed on your side with a pillow between your knees, or on your back with a pillow under your knees will often provide some relief. Keep in mind, being in any one position for a prolonged period of time, no matter how correct your posture, can still lead to stiffness.  WALKING Walk with an upright posture. Your ears, shoulders, and hips should all line up. OFFICE WORK When working at a desk, create an environment that supports good, upright posture. Without extra support, muscles fatigue and lead to excessive strain on joints and other tissues.  CHAIR: A chair should be able to slide under your desk when your back makes contact with the back of the chair. This allows you to work closely. The chair's height should allow your eyes to be  level with the upper part of your monitor and your hands to be slightly lower than your elbows. Body position: Your feet should make contact with the floor. If this is not possible, use a foot rest. Keep your ears over your shoulders. This will reduce stress on your neck and low back.

## 2022-09-12 ENCOUNTER — Other Ambulatory Visit: Payer: Self-pay | Admitting: Family Medicine

## 2022-09-12 ENCOUNTER — Encounter: Payer: Self-pay | Admitting: Family Medicine

## 2022-09-12 MED ORDER — CELECOXIB 100 MG PO CAPS: ORAL_CAPSULE | ORAL | 0 refills | Status: DC

## 2022-10-04 ENCOUNTER — Ambulatory Visit: Payer: Medicare HMO | Admitting: Podiatry

## 2022-10-04 ENCOUNTER — Encounter: Payer: Self-pay | Admitting: Podiatry

## 2022-10-04 DIAGNOSIS — M79675 Pain in left toe(s): Secondary | ICD-10-CM | POA: Diagnosis not present

## 2022-10-04 DIAGNOSIS — M79674 Pain in right toe(s): Secondary | ICD-10-CM

## 2022-10-04 DIAGNOSIS — T25121A Burn of first degree of right foot, initial encounter: Secondary | ICD-10-CM

## 2022-10-04 DIAGNOSIS — B351 Tinea unguium: Secondary | ICD-10-CM
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
## 2022-10-05 ENCOUNTER — Ambulatory Visit: Payer: Medicare HMO | Admitting: Podiatry

## 2022-10-13 ENCOUNTER — Ambulatory Visit: Payer: Medicare HMO | Admitting: Podiatry

## 2022-10-17 ENCOUNTER — Ambulatory Visit: Payer: Medicare HMO | Admitting: Orthopaedic Surgery

## 2022-10-17 ENCOUNTER — Telehealth: Payer: Self-pay

## 2022-10-17 DIAGNOSIS — M1712 Unilateral primary osteoarthritis, left knee: Secondary | ICD-10-CM | POA: Diagnosis not present

## 2022-10-17 NOTE — Telephone Encounter (Signed)
VOB submitted for Durolane, left knee.

## 2022-10-17 NOTE — Telephone Encounter (Signed)
Please precert for left knee visco. Dr.Xu's patient.

## 2022-10-17 NOTE — Progress Notes (Signed)
Office Visit Note   Patient: Jon Townsend           Date of Birth: 07-21-1955           MRN: [id number] Visit Date: 10/17/2022              Requested by: Sharlene Dory, DO [address] [place],  [place] [address] PCP: Sharlene Dory, DO   Assessment & Plan: Visit Diagnoses:  1. Primary osteoarthritis of left knee     Plan: Jon Townsend is a 67 year old gentleman with advanced left knee DJD.  He is experiencing temporary relief from Celebrex and cortisone injections.  He feels like he has to take Celebrex twice a day to get relief.  He is interested in Visco injections.  Will get approval for this.  In the meantime he will think about knee replacement.  This patient is diagnosed with osteoarthritis of the knee(s).    Radiographs show evidence of joint space narrowing, osteophytes, subchondral sclerosis and/or subchondral cysts.  This patient has knee pain which interferes with functional and activities of daily living.    This patient has experienced inadequate response, adverse effects and/or intolerance with conservative treatments such as acetaminophen, NSAIDS, topical creams, physical therapy or regular exercise, knee bracing and/or weight loss.   This patient has experienced inadequate response or has a contraindication to intra articular steroid injections for at least 3 months.   This patient is not scheduled to have a total knee replacement within 6 months of starting treatment with viscosupplementation.  Follow-Up Instructions: No follow-ups on file.   Orders:  No orders of the defined types were placed in this encounter.  No orders of the defined types were placed in this encounter.     Procedures: No procedures performed   Clinical Data: No additional findings.   Subjective: Chief Complaint  Patient presents with   Left Knee - Pain    HPI Jon Townsend is a 67 year old gentleman who comes in for follow-up for left knee osteoarthritis.   Had a cortisone injection in June which helped for a month.  His PCP placed him on Celebrex which does help as long as he takes it twice a day.  He feels recurrence and pain as soon as he stops the Celebrex.  Review of Systems   Objective: Vital Signs: There were no vitals taken for this visit.  Physical Exam  Ortho Exam Exam of the left knee is unchanged. Specialty Comments:  No specialty comments available.  Imaging: No results found.   PMFS History: Patient Active Problem List   Diagnosis Date Noted   Hypertriglyceridemia 09/05/2022   Mixed hyperlipidemia 09/05/2022   PVD (peripheral vascular disease) (HCC) 03/08/2021   Acute blood loss anemia    Bloody stool    Controlled type 2 diabetes mellitus with hyperglycemia, without long-term current use of insulin (HCC)    Weakness 02/03/2020   Essential hypertension 02/03/2020   Polyneuropathy 02/03/2020   COVID-19 virus infection 02/03/2020   Type 2 diabetes mellitus with obesity (HCC)    BPH (benign prostatic hyperplasia)    Chronic right-sided low back pain without sciatica 04/03/2016   H/O urinary frequency 04/03/2016   Annual physical exam 02/03/2016   Low testosterone level in male 01/15/2015   ADD (attention deficit disorder) 01/14/2015   Class 1 obesity in adult 01/14/2015   Elevated BP without diagnosis of hypertension 01/14/2015   Lumbar paraspinal muscle spasm 01/14/2015   Rosacea 01/14/2015   Past Medical  History:  Diagnosis Date   Allergy    BPH (benign prostatic hyperplasia)    Diabetes mellitus type 2 in obese    Glaucoma    Hypertension     Family History  Problem Relation Age of Onset   Cancer Father    Early death Father    Diabetes Sister    Heart disease Sister    COPD Sister    Diabetes Sister    Colon cancer Neg Hx    Esophageal cancer Neg Hx    Stomach cancer Neg Hx    Rectal cancer Neg Hx     Past Surgical History:  Procedure Laterality Date   Broken finger     TONSILLECTOMY      Social History   Occupational History   Not on file  Tobacco Use   Smoking status: Former   Smokeless tobacco: Never   Tobacco comments:    Quit 35 years ago  Substance and Sexual Activity   Alcohol use: Never   Drug use: Never   Sexual activity: Not on file

## 2022-10-19 ENCOUNTER — Telehealth: Payer: Self-pay

## 2022-10-19 NOTE — Telephone Encounter (Signed)
Samples received Ozempic 1x3 mlprefilled pens. 4 pens received.  Patient notified to come in for pick up

## 2022-10-24 ENCOUNTER — Encounter: Payer: Self-pay | Admitting: Orthopaedic Surgery

## 2022-10-25 ENCOUNTER — Other Ambulatory Visit: Payer: Self-pay

## 2022-10-25 ENCOUNTER — Telehealth: Payer: Self-pay | Admitting: Orthopaedic Surgery

## 2022-10-25 DIAGNOSIS — M1712 Unilateral primary osteoarthritis, left knee: Secondary | ICD-10-CM

## 2022-10-25 NOTE — Telephone Encounter (Signed)
Talked with patient and appointment has been scheduled.  

## 2022-10-25 NOTE — Telephone Encounter (Signed)
See previous message in chart.

## 2022-10-25 NOTE — Telephone Encounter (Signed)
Patient called and wants to know if he was approved for the Gel shot. [phone]

## 2022-10-27 NOTE — Telephone Encounter (Signed)
No comments ?

## 2022-10-30 ENCOUNTER — Telehealth: Payer: Self-pay | Admitting: Radiology

## 2022-10-30 NOTE — Telephone Encounter (Signed)
Can you please reschedule patients appointment with Dr. Roda Shutters tomorrow?  We are out of stock of the gel injection & it won't be in until maybe Thursday or Friday.  Thank you!

## 2022-10-31 ENCOUNTER — Ambulatory Visit: Payer: Medicare HMO | Admitting: Orthopaedic Surgery

## 2022-11-03 ENCOUNTER — Ambulatory Visit: Payer: Medicare HMO | Admitting: Physician Assistant

## 2022-11-03 ENCOUNTER — Encounter: Payer: Self-pay | Admitting: Physician Assistant

## 2022-11-03 DIAGNOSIS — M1712 Unilateral primary osteoarthritis, left knee: Secondary | ICD-10-CM

## 2022-11-06 ENCOUNTER — Encounter: Payer: Self-pay | Admitting: Physician Assistant

## 2022-11-06 DIAGNOSIS — M1712 Unilateral primary osteoarthritis, left knee: Secondary | ICD-10-CM

## 2022-11-06 MED ORDER — SODIUM HYALURONATE 60 MG/3ML IX PRSY
60.0000 mg | PREFILLED_SYRINGE | INTRA_ARTICULAR | Status: AC | PRN
Start: 2022-11-06 — End: 2022-11-06
  Administered 2022-11-06: 60 mg via INTRA_ARTICULAR

## 2022-11-06 NOTE — Progress Notes (Signed)
Office Visit Note   Patient: Jon Townsend           Date of Birth: [birth year]           MRN: [id number] Visit Date: 11/03/2022              Requested by: Sharlene Dory, DO [address] [place],  [place] [address] PCP: Sharlene Dory, DO  Chief Complaint  Patient presents with  . Left Knee - Follow-up    Durolane      HPI: [name] is a pleasant 67 year old gentleman who comes in today for Duralone injection into his left knee.  He has history of osteoarthritis.  He has not gotten long sustained relief from steroid injections.  Assessment & Plan: Visit Diagnoses: Osteoarthritis left knee  Plan: Reviewed the side effects and risks with the injection.  Went forward with injection today may follow-up as needed  Follow-Up Instructions: Return if symptoms worsen or fail to improve.   Ortho Exam  Patient is alert, oriented, no adenopathy, well-dressed, normal affect, normal respiratory effort. Examination left knee no erythema no swelling compartments are soft and compressible neurovascular intact  Imaging: No results found. No images are attached to the encounter.  Labs: Lab Results  Component Value Date   HGBA1C 6.4 09/05/2022   HGBA1C 6.6 (H) 03/07/2022   HGBA1C 6.6 (H) 08/30/2021   ESRSEDRATE 5 02/03/2020   CRP 0.7 02/08/2020   CRP 0.7 02/07/2020   CRP 1.1 (H) 02/06/2020     Lab Results  Component Value Date   ALBUMIN 4.1 09/05/2022   ALBUMIN 4.1 03/07/2022   ALBUMIN 4.2 08/30/2021    Lab Results  Component Value Date   MG 2.0 02/19/2020   No results found for: "VD25OH"  No results found for: "PREALBUMIN"    Latest Ref Rng & Units 03/07/2022    9:36 AM 03/08/2021   10:01 AM 03/08/2020   11:41 AM  CBC EXTENDED  WBC 4.0 - 10.5 K/uL 6.2  5.2  7.1   RBC 4.22 - 5.81 Mil/uL 5.13  5.00  4.63   Hemoglobin 13.0 - 17.0 g/dL 86.5  78.4  69.6   HCT 39.0 - 52.0 % 47.5  46.6  43.0   Platelets 150.0 - 400.0 K/uL 190.0  179.0   244.0      There is no height or weight on file to calculate BMI.  Orders:  No orders of the defined types were placed in this encounter.  No orders of the defined types were placed in this encounter.    Procedures: Large Joint Inj on 11/06/2022 8:20 AM Indications: pain and diagnostic evaluation Details: 1.5 in anteromedial approach  Arthrogram: No  Medications: 60 mg Sodium Hyaluronate 60 MG/3ML Outcome: tolerated well, no immediate complications Procedure, treatment alternatives, risks and benefits explained, specific risks discussed. Consent was given by the patient.    Clinical Data: No additional findings.  ROS:  All other systems negative, except as noted in the HPI. Review of Systems  Objective: Vital Signs: There were no vitals taken for this visit.  Specialty Comments:  No specialty comments available.  PMFS History: Patient Active Problem List   Diagnosis Date Noted  . Hypertriglyceridemia 09/05/2022  . Mixed hyperlipidemia 09/05/2022  . PVD (peripheral vascular disease) (HCC) 03/08/2021  . Acute blood loss anemia   . Bloody stool   . Controlled type 2 diabetes mellitus with hyperglycemia, without long-term current use of insulin (HCC)   . Weakness  02/03/2020  . Essential hypertension 02/03/2020  . Polyneuropathy 02/03/2020  . COVID-19 virus infection 02/03/2020  . Type 2 diabetes mellitus with obesity (HCC)   . BPH (benign prostatic hyperplasia)   . Chronic right-sided low back pain without sciatica 04/03/2016  . H/O urinary frequency 04/03/2016  . Annual physical exam 02/03/2016  . Low testosterone level in male 01/15/2015  . ADD (attention deficit disorder) 01/14/2015  . Class 1 obesity in adult 01/14/2015  . Elevated BP without diagnosis of hypertension 01/14/2015  . Lumbar paraspinal muscle spasm 01/14/2015  . Rosacea 01/14/2015   Past Medical History:  Diagnosis Date  . Allergy   . BPH (benign prostatic hyperplasia)   . Diabetes  mellitus type 2 in obese   . Glaucoma   . Hypertension     Family History  Problem Relation Age of Onset  . Cancer Father   . Early death Father   . Diabetes Sister   . Heart disease Sister   . COPD Sister   . Diabetes Sister   . Colon cancer Neg Hx   . Esophageal cancer Neg Hx   . Stomach cancer Neg Hx   . Rectal cancer Neg Hx     Past Surgical History:  Procedure Laterality Date  . Broken finger    . TONSILLECTOMY     Social History   Occupational History  . Not on file  Tobacco Use  . Smoking status: Former  . Smokeless tobacco: Never  . Tobacco comments:    Quit 35 years ago  Substance and Sexual Activity  . Alcohol use: Never  . Drug use: Never  . Sexual activity: Not on file

## 2022-11-07 DIAGNOSIS — H2513 Age-related nuclear cataract, bilateral: Secondary | ICD-10-CM | POA: Diagnosis not present

## 2022-11-07 DIAGNOSIS — E119 Type 2 diabetes mellitus without complications: Secondary | ICD-10-CM | POA: Diagnosis not present

## 2022-11-07 DIAGNOSIS — H524 Presbyopia: Secondary | ICD-10-CM | POA: Diagnosis not present

## 2022-11-07 LAB — HM DIABETES EYE EXAM

## 2022-11-12 ENCOUNTER — Other Ambulatory Visit: Payer: Self-pay | Admitting: Family Medicine

## 2022-11-12 DIAGNOSIS — E669 Obesity, unspecified: Secondary | ICD-10-CM

## 2022-11-12 DIAGNOSIS — E1169 Type 2 diabetes mellitus with other specified complication: Secondary | ICD-10-CM

## 2022-11-14 DIAGNOSIS — D2239 Melanocytic nevi of other parts of face: Secondary | ICD-10-CM | POA: Diagnosis not present

## 2022-11-14 DIAGNOSIS — L814 Other melanin hyperpigmentation: Secondary | ICD-10-CM | POA: Diagnosis not present

## 2022-11-14 DIAGNOSIS — L820 Inflamed seborrheic keratosis: Secondary | ICD-10-CM | POA: Diagnosis not present

## 2022-11-14 DIAGNOSIS — D225 Melanocytic nevi of trunk: Secondary | ICD-10-CM | POA: Diagnosis not present

## 2022-11-14 DIAGNOSIS — L821 Other seborrheic keratosis: Secondary | ICD-10-CM | POA: Diagnosis not present

## 2022-12-18 ENCOUNTER — Encounter: Payer: Self-pay | Admitting: Orthopaedic Surgery

## 2022-12-19 ENCOUNTER — Other Ambulatory Visit: Payer: Self-pay | Admitting: Physician Assistant

## 2022-12-19 MED ORDER — MELOXICAM 7.5 MG PO TABS: 7.5000 mg | ORAL_TABLET | Freq: Every day | ORAL | 2 refills | Status: DC | PRN

## 2022-12-19 NOTE — Telephone Encounter (Signed)
sent 

## 2022-12-20 ENCOUNTER — Other Ambulatory Visit: Payer: Self-pay | Admitting: Family Medicine

## 2022-12-22 ENCOUNTER — Encounter: Payer: Self-pay | Admitting: Family Medicine

## 2023-01-04 ENCOUNTER — Ambulatory Visit: Payer: Medicare HMO | Admitting: Podiatry

## 2023-01-04 DIAGNOSIS — B351 Tinea unguium: Secondary | ICD-10-CM | POA: Diagnosis not present

## 2023-01-04 DIAGNOSIS — E1151 Type 2 diabetes mellitus with diabetic peripheral angiopathy without gangrene: Secondary | ICD-10-CM

## 2023-01-04 DIAGNOSIS — L84 Corns and callosities: Secondary | ICD-10-CM | POA: Diagnosis not present

## 2023-01-04 DIAGNOSIS — M79675 Pain in left toe(s): Secondary | ICD-10-CM

## 2023-01-04 DIAGNOSIS — M79674 Pain in right toe(s): Secondary | ICD-10-CM | POA: Diagnosis not present

## 2023-01-04 NOTE — Progress Notes (Signed)
       Subjective:  Patient ID: Jon Townsend, male    DOB: 05-22-1955,  MRN: [id number]  [name] presents to clinic today for:  Chief Complaint  Patient presents with   [facility]    [facility] A1c 6.4 No oanti coag    Patient notes nails are thick and elongated, causing pain in shoe gear when ambulating.  He also has a pinch callus on the right great toe.  Patient notes that the left great toenail along the medial border has been feeling ingrown.  He does not want this cut back and does not want a PNA.  He states that he has watched his son have it done and he does not want any pain.  He like to try to let it grow out and does not want to trim back today in the corner  PCP is Sharlene Dory, DO.  Last seen 09/05/2022  Past Medical History:  Diagnosis Date   Allergy    BPH (benign prostatic hyperplasia)    Diabetes mellitus type 2 in obese    Glaucoma    Hypertension    Allergies  Allergen Reactions   Venlafaxine Other (See Comments)    Caused Insomnia   Objective:  Derreon Townsend is a pleasant 67 y.o. male in NAD. AAO x 3.  Vascular Examination: Patient has palpable DP pulse, absent PT pulse bilateral.  Delayed capillary refill bilateral toes.  Sparse digital hair bilateral.  Proximal to distal cooling WNL bilateral.    Dermatological Examination: Interspaces are clear with no open lesions noted bilateral.  Skin is shiny and atrophic bilateral.  Nails are 3-72mm thick, with yellowish/brown discoloration, subungual debris and distal onycholysis x10.  There is pain with compression of nails x10.  There are hyperkeratotic lesions noted right plantarmedial 1st MPJ .     Latest Ref Rng & Units 09/05/2022    9:25 AM 03/07/2022    9:36 AM  Hemoglobin A1C  Hemoglobin-A1c 4.6 - 6.5 % 6.4  6.6    Patient qualifies for at-risk foot care because of diabetes with PVD .  Assessment/Plan: 1. Pain due to onychomycosis of toenails of both feet   2. Callus of foot   3. Type II  diabetes mellitus with peripheral circulatory disorder (HCC)    Mycotic nails x10 were sharply debrided with sterile nail nippers and power debriding burr to decrease bulk and length.  Hyperkeratotic lesion x1 on right foot was shaved with #312 blade.   Return in about 3 months (around 04/04/2023) for [facility].   Clerance Lav, DPM, FACFAS [facility]     [address] [place], [place] [address]                Office [phone]  Fax [phone]

## 2023-01-08 ENCOUNTER — Other Ambulatory Visit: Payer: Self-pay | Admitting: Physician Assistant

## 2023-01-08 MED ORDER — MELOXICAM 7.5 MG PO TABS: 7.5000 mg | ORAL_TABLET | Freq: Two times a day (BID) | ORAL | 2 refills | Status: DC | PRN

## 2023-01-08 NOTE — Telephone Encounter (Signed)
I refilled for twice per day, but I would check with pcp to make sure they are ok that he is taking this with hx of diabetes

## 2023-02-11 ENCOUNTER — Encounter: Payer: Self-pay | Admitting: Family Medicine

## 2023-02-12 ENCOUNTER — Other Ambulatory Visit: Payer: Self-pay | Admitting: Family

## 2023-02-12 DIAGNOSIS — E669 Obesity, unspecified: Secondary | ICD-10-CM

## 2023-02-12 MED ORDER — TAMSULOSIN HCL 0.4 MG PO CAPS: 0.4000 mg | ORAL_CAPSULE | Freq: Every day | ORAL | 0 refills | Status: DC

## 2023-02-12 MED ORDER — ATORVASTATIN CALCIUM 40 MG PO TABS: 40.0000 mg | ORAL_TABLET | Freq: Every day | ORAL | 0 refills | Status: DC

## 2023-02-12 NOTE — Telephone Encounter (Signed)
 Okay to refill?

## 2023-02-20 ENCOUNTER — Encounter: Payer: Self-pay | Admitting: Gastroenterology

## 2023-03-13 ENCOUNTER — Encounter: Payer: Self-pay | Admitting: Family Medicine

## 2023-03-13 ENCOUNTER — Ambulatory Visit (INDEPENDENT_AMBULATORY_CARE_PROVIDER_SITE_OTHER): Payer: Medicare HMO | Admitting: Family Medicine

## 2023-03-13 VITALS — BP 118/64 | HR 64 | Temp 97.5°F | Resp 20 | Ht 73.0 in | Wt 263.4 lb

## 2023-03-13 DIAGNOSIS — Z0000 Encounter for general adult medical examination without abnormal findings: Secondary | ICD-10-CM | POA: Diagnosis not present

## 2023-03-13 DIAGNOSIS — E1169 Type 2 diabetes mellitus with other specified complication: Secondary | ICD-10-CM

## 2023-03-13 DIAGNOSIS — Z7984 Long term (current) use of oral hypoglycemic drugs: Secondary | ICD-10-CM | POA: Diagnosis not present

## 2023-03-13 DIAGNOSIS — Z1211 Encounter for screening for malignant neoplasm of colon: Secondary | ICD-10-CM

## 2023-03-13 DIAGNOSIS — E669 Obesity, unspecified: Secondary | ICD-10-CM | POA: Diagnosis not present

## 2023-03-13 LAB — CBC WITH DIFFERENTIAL/PLATELET
Basophils Absolute: 0 10*3/uL (ref 0.0–0.1)
Basophils Relative: 0.8 % (ref 0.0–3.0)
Eosinophils Absolute: 0.2 10*3/uL (ref 0.0–0.7)
Eosinophils Relative: 3.5 % (ref 0.0–5.0)
HCT: 46.4 % (ref 39.0–52.0)
Hemoglobin: 15.5 g/dL (ref 13.0–17.0)
Lymphocytes Relative: 22.3 % (ref 12.0–46.0)
Lymphs Abs: 1.4 10*3/uL (ref 0.7–4.0)
MCHC: 33.4 g/dL (ref 30.0–36.0)
MCV: 94.7 fl (ref 78.0–100.0)
Monocytes Absolute: 0.5 10*3/uL (ref 0.1–1.0)
Monocytes Relative: 8.4 % (ref 3.0–12.0)
Neutro Abs: 4 10*3/uL (ref 1.4–7.7)
Neutrophils Relative %: 65 % (ref 43.0–77.0)
Platelets: 198 10*3/uL (ref 150.0–400.0)
RBC: 4.9 Mil/uL (ref 4.22–5.81)
RDW: 13 % (ref 11.5–15.5)
WBC: 6.1 10*3/uL (ref 4.0–10.5)

## 2023-03-13 LAB — LIPID PANEL
Cholesterol: 100 mg/dL (ref 0–200)
HDL: 34.9 mg/dL — ABNORMAL LOW (ref >39.00)
LDL Cholesterol: 41 mg/dL (ref 0–99)
NonHDL: 65.27
Total CHOL/HDL Ratio: 3
Triglycerides: 121 mg/dL (ref 0.0–149.0)
VLDL: 24.2 mg/dL (ref 0.0–40.0)

## 2023-03-13 LAB — COMPREHENSIVE METABOLIC PANEL
ALT: 19 U/L (ref 0–53)
AST: 18 U/L (ref 0–37)
Albumin: 4.2 g/dL (ref 3.5–5.2)
Alkaline Phosphatase: 81 U/L (ref 39–117)
BUN: 19 mg/dL (ref 6–23)
CO2: 31 meq/L (ref 19–32)
Calcium: 9.2 mg/dL (ref 8.4–10.5)
Chloride: 99 meq/L (ref 96–112)
Creatinine, Ser: 0.97 mg/dL (ref 0.40–1.50)
GFR: 79.76 mL/min (ref >60.00)
Glucose, Bld: 125 mg/dL — ABNORMAL HIGH (ref 70–99)
Potassium: 4.4 meq/L (ref 3.5–5.1)
Sodium: 137 meq/L (ref 135–145)
Total Bilirubin: 1.2 mg/dL (ref 0.2–1.2)
Total Protein: 6.8 g/dL (ref 6.0–8.3)

## 2023-03-13 LAB — HEMOGLOBIN A1C: Hgb A1c MFr Bld: 6.8 % — ABNORMAL HIGH (ref 4.6–6.5)

## 2023-03-13 MED ORDER — TAMSULOSIN HCL 0.4 MG PO CAPS: 0.4000 mg | ORAL_CAPSULE | Freq: Every day | ORAL | 3 refills | Status: AC

## 2023-03-13 MED ORDER — ATORVASTATIN CALCIUM 40 MG PO TABS: 40.0000 mg | ORAL_TABLET | Freq: Every day | ORAL | 3 refills | Status: AC

## 2023-03-13 NOTE — Patient Instructions (Addendum)
 Give [place] 2-3 business days to get the results of your labs back.   Keep the diet clean and stay active.  Please get me a copy of your advanced directive form at your convenience.   If you do not hear anything about your referral in the next 1-2 weeks, call our office and ask for an update.  Please consider getting the tetanus booster and mainly the pneumonia vaccine at the pharmacy.   Let us know if you need anything.

## 2023-03-13 NOTE — Progress Notes (Signed)
 Chief Complaint  Patient presents with   Annual Exam    Patient presents today for a physical exam.   Quality Metric Gaps    Colonoscopy, AWV    [name] is here for a complete physical.   His last physical was >1 year ago.  Current diet: in general, a "healthy" diet.   Current exercise: lifting wts, walking, active in yard Weight trend: increased a little Fatigue out of ordinary? No. Seat belt? Yes.   Advanced directive? No  Health maintenance Shingrix- Yes Colonoscopy- No Tetanus- No Hep C- Yes Pneumonia vaccine- Due  Past Medical History:  Diagnosis Date   Allergy    BPH (benign prostatic hyperplasia)    Diabetes mellitus type 2 in obese    Glaucoma    Hypertension      Past Surgical History:  Procedure Laterality Date   Broken finger     TONSILLECTOMY      Medications  Current Outpatient Medications on File Prior to Visit  Medication Sig Dispense Refill   atorvastatin (LIPITOR) 40 MG tablet Take 1 tablet (40 mg total) by mouth daily. 90 tablet 0   celecoxib (CELEBREX) 100 MG capsule Take 1 capsule twice daily as needed for pain. 60 capsule 0   Coenzyme Q10 (COQ10) 100 MG CAPS Take 100 mg by mouth in the morning.     fexofenadine (ALLEGRA) 180 MG tablet Take 180 mg by mouth in the morning.     fluticasone (FLONASE) 50 MCG/ACT nasal spray Place 2 sprays into both nostrils daily. 16 g 6   gabapentin (NEURONTIN) 300 MG capsule TAKE 1 CAPSULE BY MOUTH THREE TIMES DAILY 270 capsule 0   glucose blood (ONETOUCH VERIO) test strip Use as once daily to check blood sugar. 100 each 11   latanoprost (XALATAN) 0.005 % ophthalmic solution Place 1 drop into both eyes at bedtime.     lisinopril-hydrochlorothiazide (ZESTORETIC) 10-12.5 MG tablet Take 1 tablet by mouth daily. 90 tablet 3   magnesium oxide (MAG-OX) 400 (241.3 Mg) MG tablet Take 1 tablet (400 mg total) by mouth daily.     meloxicam (MOBIC) 7.5 MG tablet Take 1 tablet (7.5 mg total) by mouth 2 (two)  times daily as needed for pain. 30 tablet 2   metFORMIN (GLUCOPHAGE-XR) 500 MG 24 hr tablet Take 2 tabs daily. 180 tablet 3   OneTouch Delica Lancets 33G MISC Check blood sugars once daily 100 each PRN   Semaglutide, 1 MG/DOSE, 4 MG/3ML SOPN Inject 1 mg as directed once a week. 3 mL 0   tamsulosin (FLOMAX) 0.4 MG CAPS capsule Take 1 capsule (0.4 mg total) by mouth daily. 90 capsule 0   tiZANidine (ZANAFLEX) 4 MG tablet Take 1 tablet (4 mg total) by mouth every 6 (six) hours as needed for muscle spasms. 30 tablet 0   vitamin C (ASCORBIC ACID) 500 MG tablet Take 500-1,000 mg by mouth daily.      Allergies Allergies  Allergen Reactions   Venlafaxine Other (See Comments)    Caused Insomnia    Family History Family History  Problem Relation Age of Onset   Cancer Father    Early death Father    Diabetes Sister    Heart disease Sister    COPD Sister    Diabetes Sister    Colon cancer Neg Hx    Esophageal cancer Neg Hx    Stomach cancer Neg Hx    Rectal cancer Neg Hx     Review of Systems:  Constitutional:  no fevers Eye:  no recent significant change in vision Ears:  No changes in hearing Nose/Mouth/Throat:  no complaints of nasal congestion, no sore throat Cardiovascular: no chest pain Respiratory:  No shortness of breath Gastrointestinal:  No change in bowel habits GU:  No frequency Integumentary:  no abnormal skin lesions reported Neurologic:  no headaches Endocrine:  denies unexplained weight changes  Exam BP 118/64   Pulse 64   Temp (!) 97.5 F (36.4 C)   Resp 20   Ht 6\' 1"  (1.854 m)   Wt 263 lb 6.4 oz (119.5 kg)   SpO2 97%   BMI 34.75 kg/m  General:  well developed, well nourished, in no apparent distress Skin:  no significant moles, warts, or growths Head:  no masses, lesions, or tenderness Eyes:  pupils equal and round, sclera anicteric without injection Ears:  canals without lesions, TMs shiny without retraction, no obvious effusion, no erythema Nose:   nares patent, mucosa normal Throat/Pharynx:  lips and gingiva without lesion; tongue and uvula midline; non-inflamed pharynx; no exudates or postnasal drainage Lungs:  clear to auscultation, breath sounds equal bilaterally, no respiratory distress Cardio:  regular rate and rhythm, no LE edema or bruits Rectal: Deferred GI: BS+, S, NT, ND, no masses or organomegaly Musculoskeletal:  symmetrical muscle groups noted without atrophy or deformity Neuro:  gait normal; deep tendon reflexes normal and symmetric Psych: well oriented with normal range of affect and appropriate judgment/insight  Assessment and Plan  Well adult exam  Screening for colon cancer - Plan: Ambulatory referral to Gastroenterology  Type 2 diabetes mellitus with obesity (HCC) - Plan: Hemoglobin A1c, Comp Met (CMET), CBC with Differential/Platelet, Lipid panel, atorvastatin (LIPITOR) 40 MG tablet   Well 68 y.o. male. Counseled on diet and exercise. Advanced directive form provided today.  PVC20 rec'd.  Politely declined. Tetanus booster rec'd to get at pharmacy. CCS- as above.  Other orders as above. Follow up in 6 mo.  The patient voiced understanding and agreement to the plan.  Jilda Roche [place], DO 03/13/23 9:30 AM

## 2023-03-14 ENCOUNTER — Telehealth: Payer: Self-pay | Admitting: Family Medicine

## 2023-03-14 NOTE — Telephone Encounter (Signed)
 Copied from CRM [id number]. Topic: Medicare AWV >> Mar 14, 2023 10:06 AM Payton Doughty wrote: Reason for CRM: Called LVM 03/14/2023 to schedule AWV. Please schedule Virtual or Telehealth visits ONLY.   Verlee Rossetti; Care Guide Ambulatory Clinical Support [place] [facility] Direct Dial: [phone]

## 2023-03-23 ENCOUNTER — Encounter: Payer: Self-pay | Admitting: Gastroenterology

## 2023-03-28 NOTE — Progress Notes (Unsigned)
   Office Visit Note   Patient: Jon Townsend           Date of Birth: 05-22-1955           MRN: [id number] Visit Date: 03/29/2023              Requested by:  Sharlene Dory, DO [address] [place],  [place] [address]  PCP: Sharlene Dory, DO   Assessment & Plan: Visit Diagnoses: No diagnosis found.  Plan: ***  Follow-Up Instructions: No follow-ups on file.   Orders:  No orders of the defined types were placed in this encounter. No orders of the defined types were placed in this encounter.    Procedures: No procedures performed   Clinical Data: No additional findings.   Subjective: No chief complaint on file.  HPI  Review of Systems   Objective: Vital Signs: There were no vitals taken for this visit.  Physical Exam  Ortho Exam  Specialty Comments:  No specialty comments available.  Imaging: No results found.   PMFS History: Patient Active Problem List   Diagnosis Date Noted  . Hypertriglyceridemia 09/05/2022  . Mixed hyperlipidemia 09/05/2022  . PVD (peripheral vascular disease) (HCC) 03/08/2021  . Acute blood loss anemia   . Bloody stool   . Controlled type 2 diabetes mellitus with hyperglycemia, without long-term current use of insulin (HCC)   . Weakness 02/03/2020  . Essential hypertension 02/03/2020  . Polyneuropathy 02/03/2020  . COVID-19 virus infection 02/03/2020  . Type 2 diabetes mellitus with obesity (HCC)   . BPH (benign prostatic hyperplasia)   . Chronic right-sided low back pain without sciatica 04/03/2016  . H/O urinary frequency 04/03/2016  . Annual physical exam 02/03/2016  . Low testosterone level in male 01/15/2015  . ADD (attention deficit disorder) 01/14/2015  . Class 1 obesity in adult 01/14/2015  . Elevated BP without diagnosis of hypertension 01/14/2015  . Lumbar paraspinal muscle spasm 01/14/2015  . Rosacea 01/14/2015   Past Medical History:  Diagnosis Date  . Allergy   . BPH  (benign prostatic hyperplasia)   . Diabetes mellitus type 2 in obese   . Glaucoma   . Hypertension     Family History  Problem Relation Age of Onset  . Cancer Father   . Early death Father   . Diabetes Sister   . Heart disease Sister   . COPD Sister   . Diabetes Sister   . Colon cancer Neg Hx   . Esophageal cancer Neg Hx   . Stomach cancer Neg Hx   . Rectal cancer Neg Hx     Past Surgical History:  Procedure Laterality Date  . Broken finger    . TONSILLECTOMY     Social History   Occupational History  . Not on file  Tobacco Use  . Smoking status: Former  . Smokeless tobacco: Never  . Tobacco comments:    Quit 35 years ago  Substance and Sexual Activity  . Alcohol use: Never  . Drug use: Never  . Sexual activity: Not on file

## 2023-03-29 ENCOUNTER — Ambulatory Visit: Admitting: Orthopaedic Surgery

## 2023-03-29 DIAGNOSIS — M1712 Unilateral primary osteoarthritis, left knee: Secondary | ICD-10-CM | POA: Diagnosis not present

## 2023-03-29 NOTE — Addendum Note (Signed)
 Addended by: Albertina Parr on: 03/29/2023 11:21 AM   Modules accepted: Orders

## 2023-03-31 ENCOUNTER — Ambulatory Visit
Admission: RE | Admit: 2023-03-31 | Discharge: 2023-03-31 | Disposition: A | Discharge status: Home / Self Care | Source: Ambulatory Visit | Attending: Orthopaedic Surgery | Admitting: Orthopaedic Surgery

## 2023-03-31 DIAGNOSIS — M23352 Other meniscus derangements, posterior horn of lateral meniscus, left knee: Secondary | ICD-10-CM | POA: Diagnosis not present

## 2023-03-31 DIAGNOSIS — M25562 Pain in left knee: Secondary | ICD-10-CM | POA: Diagnosis not present

## 2023-03-31 DIAGNOSIS — M23602 Other spontaneous disruption of unspecified ligament of left knee: Secondary | ICD-10-CM | POA: Diagnosis not present

## 2023-03-31 DIAGNOSIS — M25462 Effusion, left knee: Secondary | ICD-10-CM | POA: Diagnosis not present

## 2023-03-31 DIAGNOSIS — M1712 Unilateral primary osteoarthritis, left knee: Secondary | ICD-10-CM

## 2023-04-04 ENCOUNTER — Encounter: Payer: Self-pay | Admitting: Family Medicine

## 2023-04-05 ENCOUNTER — Ambulatory Visit: Payer: Medicare HMO | Admitting: Podiatry

## 2023-04-05 DIAGNOSIS — M79674 Pain in right toe(s): Secondary | ICD-10-CM | POA: Diagnosis not present

## 2023-04-05 DIAGNOSIS — B351 Tinea unguium: Secondary | ICD-10-CM

## 2023-04-05 DIAGNOSIS — M79675 Pain in left toe(s): Secondary | ICD-10-CM | POA: Diagnosis not present

## 2023-04-05 NOTE — Progress Notes (Signed)
       Subjective:  Patient ID: Jon Townsend, male    DOB: Jun 28, 1955,  MRN: [id number]   Stockton Nunley presents to clinic today for:  Chief Complaint  Patient presents with   [facility]    [facility] today with no callous. Last A1c 6.8, no anti coag.    Patient notes nails are thick, discolored, elongated and painful in shoegear when trying to ambulate.  Patient once the corners of his hallux nails left long to prevent ingrowing toenails.  He does not want a PNA procedure as noted on previous visit  PCP is Carmelia Roller, Jilda Roche, DO.  Past Medical History:  Diagnosis Date   Allergy    BPH (benign prostatic hyperplasia)    Diabetes mellitus type 2 in obese    Glaucoma    Hypertension     Past Surgical History:  Procedure Laterality Date   Broken finger     TONSILLECTOMY      Allergies  Allergen Reactions   Venlafaxine Other (See Comments)    Caused Insomnia    Review of Systems: Negative except as noted in the HPI.  Objective:  Leandro Berkowitz is a pleasant 68 y.o. male in NAD. AAO x 3.  Vascular Examination: Capillary refill time is 3-5 seconds to toes bilateral. Palpable pedal pulses b/l LE. Digital hair present b/l.  Skin temperature gradient WNL b/l. No varicosities b/l. No cyanosis noted b/l.   Dermatological Examination: Pedal skin with normal turgor, texture and tone b/l. No open wounds. No interdigital macerations b/l. Toenails x10 are 3mm thick, discolored, dystrophic with subungual debris. There is pain with compression of the nail plates.  They are elongated x10     Latest Ref Rng & Units 03/13/2023    9:40 AM 09/05/2022    9:25 AM  Hemoglobin A1C  Hemoglobin-A1c 4.6 - 6.5 % 6.8  6.4    Assessment/Plan: 1. Pain due to onychomycosis of toenails of both feet    The mycotic toenails were sharply debrided x10 with sterile nail nippers and a power debriding burr to decrease bulk/thickness and length.  The hallux corners were left slightly elongated at his  request  Return in about 3 months (around 07/06/2023) for [facility].   Clerance Lav, DPM, FACFAS [facility]     [address] [place], [place] [address]                Office [phone]  Fax [phone]

## 2023-04-10 ENCOUNTER — Encounter: Payer: Self-pay | Admitting: Pharmacist

## 2023-04-10 ENCOUNTER — Other Ambulatory Visit: Payer: Self-pay | Admitting: Pharmacist

## 2023-04-10 MED ORDER — TIRZEPATIDE 2.5 MG/0.5ML ~~LOC~~ SOAJ: 2.5000 mg | SUBCUTANEOUS | 0 refills | Status: DC

## 2023-04-10 NOTE — Progress Notes (Signed)
 04/10/2023 Name: Jon Townsend MRN: [id number] DOB: April 11, 1955  Chief Complaint  Patient presents with   Diabetes   Medication Management    Jon Townsend is a 68 y.o. year old male who presented for a telephone visit.   They were referred to the pharmacist by their PCP for assistance in managing medication access.    Subjective:  Care Team: Primary Care Provider: Sharlene Dory, DO ; Next Scheduled Visit: 09/18/2023 Podiatrist - next visit 07/05/2023 Gastroenterologist: Adela Lank; Next Visit: pre procedure 05/04/23 and colonoscopy 05/18/2023  Medication Access/Adherence  Current Pharmacy:  [facility] - , [place] - [address] [place] [address] Phone: [phone] Fax: [phone]   Patient reports affordability concerns with their medications: Yes  Patient reports access/transportation concerns to their pharmacy: No  Patient reports adherence concerns with their medications:  No     Diabetes:  Current medications: metformin XR 500mg  - take 2 tablets daily  Medications tried in the past: Ozempic 0.5mg  weekly - stopped due to nausea and constipation. He tried to take fiber supplement but did not help.  Current glucose readings: 130 to 140's  Patient reports blood glucose was in 90 to 100's when he was taking Ozempic. testing every other day   Current physical activity: goes to gym for about 60 minutes Monday, Wednesdays and Fridays.   Current medication access support: none currently - per patient he was getting Ozempic for $0 in past (insurance agent assisted in applying) but for 2025 they asked for a lot more financial information and he did not feel comfortable providing requested information.    Objective:  Lab Results  Component Value Date   HGBA1C 6.8 (H) 03/13/2023    Lab Results  Component Value Date   CREATININE 0.97 03/13/2023   BUN 19 03/13/2023   NA 137 03/13/2023   K 4.4 03/13/2023   CL 99 03/13/2023    CO2 31 03/13/2023    Lab Results  Component Value Date   CHOL 100 03/13/2023   HDL 34.90 (L) 03/13/2023   LDLCALC 41 03/13/2023   LDLDIRECT 54.0 03/08/2020   TRIG 121.0 03/13/2023   CHOLHDL 3 03/13/2023    Medications Reviewed Today     Reviewed by Henrene Pastor, RPH-CPP (Pharmacist) on 04/10/23 at 1319  Med List Status: <None>   Medication Order Taking? Sig Documenting Provider Last Dose Status Informant  atorvastatin (LIPITOR) 40 MG tablet [id number] Yes Take 1 tablet (40 mg total) by mouth daily. Sharlene Dory, DO Taking Active   celecoxib (CELEBREX) 100 MG capsule [id number]  Take 1 capsule twice daily as needed for pain. Sharlene Dory, DO  Active   Coenzyme Q10 (COQ10) 100 MG CAPS [id number] Yes Take 100 mg by mouth in the morning. [provider] Taking Active Self  fexofenadine (ALLEGRA) 180 MG tablet [id number] Yes Take 180 mg by mouth in the morning. [provider] Taking Active Self  fluticasone (FLONASE) 50 MCG/ACT nasal spray [id number] Yes Place 2 sprays into both nostrils daily. Sharlene Dory, DO Taking Active   glucose blood (ONETOUCH VERIO) test strip [id number] Yes Use as once daily to check blood sugar. Sharlene Dory, DO Taking Active   latanoprost (XALATAN) 0.005 % ophthalmic solution [id number] Yes Place 1 drop into both eyes at bedtime. [provider] Taking Active   lisinopril-hydrochlorothiazide (ZESTORETIC) 10-12.5 MG tablet [id number] Yes Take 1 tablet by mouth daily. Sharlene Dory, DO Taking Active  magnesium oxide (MAG-OX) 400 (241.3 Mg) MG tablet [id number] Yes Take 1 tablet (400 mg total) by mouth daily. Jacquelynn Cree, PA-C Taking Active   metFORMIN (GLUCOPHAGE-XR) 500 MG 24 hr tablet [id number] Yes Take 2 tabs daily. Sharlene Dory, DO Taking Active   OneTouch Delica Lancets 33G MISC [id number] Yes Check blood sugars once daily Sharlene Dory, DO Taking Active    tamsulosin [facility]) 0.4 MG CAPS capsule [id number] Yes Take 1 capsule (0.4 mg total) by mouth daily. Sharlene Dory, DO Taking Active   tirzepatide [facility]) 2.5 MG/0.5ML Pen [id number] No Inject 2.5 mg into the skin once a week.  Patient not taking: Reported on 04/10/2023   Sharlene Dory, DO Not Taking Active               Assessment/Plan:   Diabetes: Currently controlled but he was taking Ozempic, home blood glucose readings have been increasing recently.  - Reviewed long term cardiovascular and renal outcomes of uncontrolled blood sugar - Reviewed goal A1c, goal fasting, and goal 2 hour post prandial glucose - Recommend trial of Mounjaro 2.5mg  weekly per Dr Carmelia Roller.   - Patient denies personal or family history of multiple endocrine neoplasia type 2, medullary thyroid cancer; personal history of pancreatitis or gallbladder disease. - Discussed way to prevent / treat constipation with GLP1's - stay hydrated, eat fiber rich foods like fruits, vegetables and whole grains. He also can take a fiber supplement or use Miralax as needed. Encouraged him to continue to exercise regularly as this also helps to prevent constipation.  - Recommend to check glucose every other day  - Tried to submit prior authorization thru Cover My Meds but [employer] does not use Cover My Med. Called [employer] for prior authorization - pending REF: [id number]  *patient does have colonoscopy scheduled in May 2025 - recommend he stop Mounjaro 1 week prior to colonoscopy unless directed otherwise by gastroenterologist.   Follow Up Plan: 2 to 4 weeks to increase dose as needed  Henrene Pastor, PharmD Clinical Pharmacist [facility]  Population Health [phone]

## 2023-04-11 ENCOUNTER — Telehealth: Payer: Self-pay | Admitting: Pharmacy Technician

## 2023-04-11 ENCOUNTER — Other Ambulatory Visit (HOSPITAL_COMMUNITY): Payer: Self-pay

## 2023-04-11 NOTE — Telephone Encounter (Signed)
 Pharmacy Patient Advocate Encounter   Received notification from CoverMyMeds that prior authorization for Mounjaro 2.5MG /0.5ML auto-injectors is required/requested.   Insurance verification completed.   The patient is insured through [employer] .   Per test claim: The current 28 day co-pay is, $420.00.  No PA needed at this time. This test claim was processed through [facility]- copay amounts may vary at other pharmacies due to pharmacy/plan contracts, or as the patient moves through the different stages of their insurance plan.

## 2023-04-12 ENCOUNTER — Encounter: Payer: Self-pay | Admitting: Orthopaedic Surgery

## 2023-04-12 ENCOUNTER — Ambulatory Visit: Admitting: Orthopaedic Surgery

## 2023-04-12 DIAGNOSIS — S83242A Other tear of medial meniscus, current injury, left knee, initial encounter: Secondary | ICD-10-CM

## 2023-04-12 DIAGNOSIS — M1712 Unilateral primary osteoarthritis, left knee: Secondary | ICD-10-CM

## 2023-04-12 DIAGNOSIS — M1711 Unilateral primary osteoarthritis, right knee: Secondary | ICD-10-CM

## 2023-04-12 MED ORDER — METHYLPREDNISOLONE ACETATE 40 MG/ML IJ SUSP
40.0000 mg | INTRAMUSCULAR | Status: AC | PRN
  Administered 2023-04-12: 40 mg via INTRA_ARTICULAR

## 2023-04-12 MED ORDER — BUPIVACAINE HCL 0.5 % IJ SOLN
2.0000 mL | INTRAMUSCULAR | Status: AC | PRN
  Administered 2023-04-12: 2 mL via INTRA_ARTICULAR

## 2023-04-12 MED ORDER — LIDOCAINE HCL 1 % IJ SOLN
2.0000 mL | INTRAMUSCULAR | Status: AC | PRN
  Administered 2023-04-12: 2 mL

## 2023-04-12 NOTE — Progress Notes (Signed)
 Office Visit Note   Patient: Jon Townsend           Date of Birth: 21-Oct-1955           MRN: [id number] Visit Date: 04/12/2023              Requested by: Sharlene Dory, DO [address] [place],  [place] [address] PCP: Sharlene Dory, DO   Assessment & Plan: Visit Diagnoses:  1. Acute medial meniscus tear of left knee, initial encounter   2. Primary osteoarthritis of left knee   3. Primary osteoarthritis of right knee     Plan: In regards to the right knee this is probably an aggravation of osteoarthritis since he has had to compensate for the left knee.  Cortisone injection performed today.  He tolerated well.  For the left knee does show complex tear of the medial meniscus with extrusion.  He has a fair amount of degenerative changes in the medial compartment with osteophytic changes.  These findings were reviewed in detail and treatment options were discussed to include knee arthroscopy versus a total knee arthroplasty and the associated risks and benefits and pros and cons.  He will think about his options.  Follow-Up Instructions: No follow-ups on file.   Orders:  Orders Placed This Encounter  Procedures   Large Joint Inj   No orders of the defined types were placed in this encounter.     Procedures: Large Joint Inj: R knee on 04/12/2023 2:46 PM Indications: pain Details: 22 G needle  Arthrogram: No  Medications: 40 mg methylPREDNISolone acetate 40 MG/ML; 2 mL lidocaine 1 %; 2 mL bupivacaine 0.5 % Consent was given by the patient. Patient was prepped and draped in the usual sterile fashion.       Clinical Data: No additional findings.   Subjective: Chief Complaint  Patient presents with   Left Knee - Follow-up    MRI review    HPI Jon Townsend returns today for follow-up evaluation of right knee pain and discussion of left knee MRI scan.  His right knee has felt worse since having to compensate for the left knee. Review  of Systems  Constitutional: Negative.   HENT: Negative.    Eyes: Negative.   Respiratory: Negative.    Cardiovascular: Negative.   Gastrointestinal: Negative.   Endocrine: Negative.   Genitourinary: Negative.   Skin: Negative.   Allergic/Immunologic: Negative.   Neurological: Negative.   Hematological: Negative.   Psychiatric/Behavioral: Negative.    All other systems reviewed and are negative.    Objective: Vital Signs: There were no vitals taken for this visit.  Physical Exam Vitals and nursing note reviewed.  Constitutional:      Appearance: He is well-developed.  Pulmonary:     Effort: Pulmonary effort is normal.  Abdominal:     Palpations: Abdomen is soft.  Skin:    General: Skin is warm.  Neurological:     Mental Status: He is alert and oriented to person, place, and time.  Psychiatric:        Behavior: Behavior normal.        Thought Content: Thought content normal.        Judgment: Judgment normal.     Ortho Exam Exam of the left knee is unchanged from prior visit.  Has significant medial joint line tenderness. Exam of the right knee shows medial joint line tenderness.  Collaterals and cruciates are stable.  No joint effusion. Specialty Comments:  No specialty comments available.  Imaging: No results found.   PMFS History: Patient Active Problem List   Diagnosis Date Noted   Hypertriglyceridemia 09/05/2022   Mixed hyperlipidemia 09/05/2022   PVD (peripheral vascular disease) (HCC) 03/08/2021   Acute blood loss anemia    Bloody stool    Controlled type 2 diabetes mellitus with hyperglycemia, without long-term current use of insulin (HCC)    Weakness 02/03/2020   Essential hypertension 02/03/2020   Polyneuropathy 02/03/2020   COVID-19 virus infection 02/03/2020   Type 2 diabetes mellitus with obesity (HCC)    BPH (benign prostatic hyperplasia)    Chronic right-sided low back pain without sciatica 04/03/2016   H/O urinary frequency 04/03/2016    Annual physical exam 02/03/2016   Low testosterone level in male 01/15/2015   ADD (attention deficit disorder) 01/14/2015   Class 1 obesity in adult 01/14/2015   Elevated BP without diagnosis of hypertension 01/14/2015   Lumbar paraspinal muscle spasm 01/14/2015   Rosacea 01/14/2015   Past Medical History:  Diagnosis Date   Allergy    BPH (benign prostatic hyperplasia)    Diabetes mellitus type 2 in obese    Glaucoma    Hypertension     Family History  Problem Relation Age of Onset   Cancer Father    Early death Father    Diabetes Sister    Heart disease Sister    COPD Sister    Diabetes Sister    Colon cancer Neg Hx    Esophageal cancer Neg Hx    Stomach cancer Neg Hx    Rectal cancer Neg Hx     Past Surgical History:  Procedure Laterality Date   Broken finger     TONSILLECTOMY     Social History   Occupational History   Not on file  Tobacco Use   Smoking status: Former   Smokeless tobacco: Never   Tobacco comments:    Quit 35 years ago  Substance and Sexual Activity   Alcohol use: Never   Drug use: Never   Sexual activity: Not on file

## 2023-04-19 ENCOUNTER — Encounter: Payer: Self-pay | Admitting: Family Medicine

## 2023-04-19 MED ORDER — FLUTICASONE PROPIONATE 50 MCG/ACT NA SUSP: 2.0000 | Freq: Every day | NASAL | 5 refills | Status: AC

## 2023-04-23 ENCOUNTER — Encounter: Payer: Self-pay | Admitting: Orthopaedic Surgery

## 2023-05-04 ENCOUNTER — Ambulatory Visit

## 2023-05-04 VITALS — Ht 73.0 in | Wt 255.0 lb

## 2023-05-04 DIAGNOSIS — Z860100 Personal history of colon polyps, unspecified: Secondary | ICD-10-CM

## 2023-05-04 NOTE — Progress Notes (Signed)
 No egg or soy allergy known to patient  No issues known to pt with past sedation with any surgeries or procedures Patient denies ever being told they had issues or difficulty with intubation  No FH of Malignant Hyperthermia Pt is not on diet pills Pt is not on  home 02  Pt is not on blood thinners  Pt denies issues with constipation  No A fib or A flutter Have any cardiac testing pending--no  LOA: independent Prep: spilt dose miralax   Patient's chart reviewed by Cathlyn Parsons CNRA prior to previsit and patient appropriate for the [facility].  Previsit completed and red dot placed by patient's name on their procedure day (on provider's schedule).     PV completed with patient. Prep instructions sent via mychart and home address.

## 2023-05-08 ENCOUNTER — Encounter: Payer: Self-pay | Admitting: Gastroenterology

## 2023-05-18 ENCOUNTER — Ambulatory Visit (AMBULATORY_SURGERY_CENTER): Admitting: Gastroenterology

## 2023-05-18 ENCOUNTER — Telehealth: Payer: Self-pay | Admitting: Pediatrics

## 2023-05-18 ENCOUNTER — Encounter: Payer: Self-pay | Admitting: Gastroenterology

## 2023-05-18 VITALS — BP 108/62 | HR 65 | Temp 98.3°F | Resp 12 | Ht 73.0 in | Wt 255.0 lb

## 2023-05-18 DIAGNOSIS — Z1211 Encounter for screening for malignant neoplasm of colon: Secondary | ICD-10-CM | POA: Diagnosis not present

## 2023-05-18 DIAGNOSIS — K648 Other hemorrhoids: Secondary | ICD-10-CM

## 2023-05-18 DIAGNOSIS — Z860100 Personal history of colon polyps, unspecified: Secondary | ICD-10-CM

## 2023-05-18 MED ORDER — SODIUM CHLORIDE 0.9 % IV SOLN: 500.0000 mL | Freq: Once | INTRAVENOUS | Status: DC

## 2023-05-18 NOTE — Progress Notes (Signed)
 [facility] History and Physical   Primary Care Physician:  Jobe Mulder, DO   Reason for Procedure:   History of polyps  Plan:    colonoscopy     HPI: Jon Townsend is a 68 y.o. male  here for colonoscopy surveillance - one TA removed 02/2018, however fair prep at that time, recommended a repeat within one year, did not have that done.   Patient denies any bowel symptoms at this time. No family history of colon cancer known. Otherwise feels well without any cardiopulmonary symptoms. Had some difficulty with prep overnight.  I have discussed risks / benefits of anesthesia and endoscopic procedure with [name] and they wish to proceed with the exams as outlined today.    Past Medical History:  Diagnosis Date   Allergy    BPH (benign prostatic hyperplasia)    Diabetes mellitus type 2 in obese    Glaucoma    Hypertension     Past Surgical History:  Procedure Laterality Date   Broken finger     TONSILLECTOMY      Prior to Admission medications   Medication Sig Start Date End Date Taking? Authorizing Provider  atorvastatin  (LIPITOR) 40 MG tablet Take 1 tablet (40 mg total) by mouth daily. 03/13/23  Yes Jobe Mulder, DO  b complex vitamins capsule Take 1 capsule by mouth daily. 01/14/15  Yes [provider]  Cholecalciferol 50 MCG (2000 UT) CAPS Take 1 capsule by mouth daily. 01/14/15  Yes [provider]  fexofenadine  (ALLEGRA ) 180 MG tablet Take 180 mg by mouth in the morning.   Yes [provider]  fluticasone  (FLONASE ) 50 MCG/ACT nasal spray Place 2 sprays into both nostrils daily. 04/19/23  Yes Wendling, Shellie Dials, DO  glucose blood (ONETOUCH VERIO) test strip Use as once daily to check blood sugar. 03/01/20  Yes Jobe Mulder, DO  latanoprost  (XALATAN ) 0.005 % ophthalmic solution Place 1 drop into both eyes at bedtime.   Yes [provider]  lisinopril -hydrochlorothiazide  (ZESTORETIC )  10-12.5 MG tablet Take 1 tablet by mouth daily. 09/05/22  Yes Jobe Mulder, DO  magnesium  oxide (MAG-OX) 400 (241.3 Mg) MG tablet Take 1 tablet (400 mg total) by mouth daily. 02/21/20  Yes Love, Renay Carota, PA-C  metFORMIN  (GLUCOPHAGE -XR) 500 MG 24 hr tablet Take 2 tabs daily. 09/05/22  Yes Wendling, Shellie Dials, DO  OneTouch Delica Lancets 33G MISC Check blood sugars once daily 07/04/21  Yes Wendling, Shellie Dials, DO  tamsulosin  (FLOMAX ) 0.4 MG CAPS capsule Take 1 capsule (0.4 mg total) by mouth daily. 03/13/23  Yes Jobe Mulder, DO  celecoxib  (CELEBREX ) 100 MG capsule Take 1 capsule twice daily as needed for pain. Patient not taking: Reported on 05/18/2023 09/12/22   Jobe Mulder, DO  Coenzyme Q10 (COQ10) 100 MG CAPS Take 100 mg by mouth in the morning.    [provider]  tirzepatide Florence Hunt) 2.5 MG/0.5ML Pen Inject 2.5 mg into the skin once a week. Patient not taking: Reported on 05/18/2023 04/10/23   Jobe Mulder, DO    Current Outpatient Medications  Medication Sig Dispense Refill   atorvastatin  (LIPITOR) 40 MG tablet Take 1 tablet (40 mg total) by mouth daily. 90 tablet 3   b complex vitamins capsule Take 1 capsule by mouth daily.     Cholecalciferol 50 MCG (2000 UT) CAPS Take 1 capsule by mouth daily.     fexofenadine  (ALLEGRA ) 180 MG tablet Take 180 mg by mouth in the  morning.     fluticasone  (FLONASE ) 50 MCG/ACT nasal spray Place 2 sprays into both nostrils daily. 16 g 5   glucose blood (ONETOUCH VERIO) test strip Use as once daily to check blood sugar. 100 each 11   latanoprost  (XALATAN ) 0.005 % ophthalmic solution Place 1 drop into both eyes at bedtime.     lisinopril -hydrochlorothiazide  (ZESTORETIC ) 10-12.5 MG tablet Take 1 tablet by mouth daily. 90 tablet 3   magnesium  oxide (MAG-OX) 400 (241.3 Mg) MG tablet Take 1 tablet (400 mg total) by mouth daily.     metFORMIN  (GLUCOPHAGE -XR) 500 MG 24 hr tablet Take 2 tabs daily. 180 tablet 3    OneTouch Delica Lancets 33G MISC Check blood sugars once daily 100 each PRN   tamsulosin  (FLOMAX ) 0.4 MG CAPS capsule Take 1 capsule (0.4 mg total) by mouth daily. 90 capsule 3   celecoxib  (CELEBREX ) 100 MG capsule Take 1 capsule twice daily as needed for pain. (Patient not taking: Reported on 05/18/2023) 60 capsule 0   Coenzyme Q10 (COQ10) 100 MG CAPS Take 100 mg by mouth in the morning.     tirzepatide (MOUNJARO) 2.5 MG/0.5ML Pen Inject 2.5 mg into the skin once a week. (Patient not taking: Reported on 05/18/2023) 2 mL 0   Current Facility-Administered Medications  Medication Dose Route Frequency Provider Last Rate Last Admin   0.9 %  sodium chloride  infusion  500 mL Intravenous Once Cruzita Lipa, Lendon Queen, MD        Allergies as of 05/18/2023 - Review Complete 05/18/2023  Allergen Reaction Noted   Venlafaxine  Other (See Comments) 09/17/2020    Family History  Problem Relation Age of Onset   Cancer Father    Early death Father    Diabetes Sister    Heart disease Sister    COPD Sister    Diabetes Sister    Colon cancer Neg Hx    Esophageal cancer Neg Hx    Stomach cancer Neg Hx    Rectal cancer Neg Hx     Social History   Socioeconomic History   Marital status: Married    Spouse name: [name]   Number of children: 1   Years of education: Not on file   Highest education level: Not on file  Occupational History   Not on file  Tobacco Use   Smoking status: Former   Smokeless tobacco: Never   Tobacco comments:    Quit 35 years ago  Substance and Sexual Activity   Alcohol use: Never   Drug use: Never   Sexual activity: Not on file  Other Topics Concern   Not on file  Social History Narrative   Lives in [place] with wife, [name].   1 daughter, 1 grandson   Social Drivers of Corporate investment banker Strain: Low Risk  (05/02/2022)   Overall Financial Resource Strain (CARDIA)    Difficulty of Paying Living Expenses: Not hard at all  Food Insecurity: No Food Insecurity  (05/02/2022)   Hunger Vital Sign    Worried About Running Out of Food in the Last Year: Never true    Ran Out of Food in the Last Year: Never true  Transportation Needs: No Transportation Needs (05/02/2022)   PRAPARE - Administrator, Civil Service (Medical): No    Lack of Transportation (Non-Medical): No  Physical Activity: Sufficiently Active (05/02/2022)   Exercise Vital Sign    Days of Exercise per Week: 3 days    Minutes of Exercise per Session: 60  min  Stress: No Stress Concern Present (05/02/2022)   [employer] of Occupational Health - Occupational Stress Questionnaire    Feeling of Stress : Not at all  Social Connections: Unknown (05/02/2022)   Social Connection and Isolation Panel [NHANES]    Frequency of Communication with Friends and Family: More than three times a week    Frequency of Social Gatherings with Friends and Family: Patient declined    Attends Religious Services: Not on Insurance claims handler of Clubs or Organizations: No    Attends Banker Meetings: Never    Marital Status: Married  Catering manager Violence: Not At Risk (05/03/2022)   Humiliation, Afraid, Rape, and Kick questionnaire    Fear of Current or Ex-Partner: No    Emotionally Abused: No    Physically Abused: No    Sexually Abused: No    Review of Systems: All other review of systems negative except as mentioned in the HPI.  Physical Exam: Vital signs BP 132/64   Pulse 78   Temp 98.3 F (36.8 C)   Ht 6\' 1"  (1.854 m)   Wt 255 lb (115.7 kg)   SpO2 97%   BMI 33.64 kg/m   General:   Alert,  Well-developed, pleasant and cooperative in NAD Lungs:  Clear throughout to auscultation.   Heart:  Regular rate and rhythm Abdomen:  Soft, nontender and nondistended.   Neuro/Psych:  Alert and cooperative. Normal mood and affect. A and O x 3  Christi Coward, MD [facility]

## 2023-05-18 NOTE — Progress Notes (Signed)
 Report to PACU, RN, vss, BBS= Clear.

## 2023-05-18 NOTE — Telephone Encounter (Signed)
 Thank you Haskell Linker for helping him overnight, hopefully he is okay for today.

## 2023-05-18 NOTE — Op Note (Signed)
 [facility] Patient Name: Jon Townsend Procedure Date: 05/18/2023 8:30 AM MRN: [id number] Endoscopist: Landon Pinion P. General Kenner , MD, [id number] Age: 68 Referring MD:  Date of Birth: [birth year] Gender: Male Account #: [id number] Procedure:                Colonoscopy Indications:              High risk colon cancer surveillance: Personal                            history of colonic polyps - diminutive adenoma                            removed 02/2018 in setting of fair prep Medicines:                Monitored Anesthesia Care Procedure:                Pre-Anesthesia Assessment:                           - Prior to the procedure, a History and Physical                            was performed, and patient medications and                            allergies were reviewed. The patient's tolerance of                            previous anesthesia was also reviewed. The risks                            and benefits of the procedure and the sedation                            options and risks were discussed with the patient.                            All questions were answered, and informed consent                            was obtained. Prior Anticoagulants: The patient has                            taken no anticoagulant or antiplatelet agents. ASA                            Grade Assessment: II - A patient with mild systemic                            disease. After reviewing the risks and benefits,                            the patient was deemed in satisfactory condition to  undergo the procedure.                           After obtaining informed consent, the colonoscope                            was passed under direct vision. Throughout the                            procedure, the patient's blood pressure, pulse, and                            oxygen saturations were monitored continuously. The                            Olympus Scope  [id number] was introduced through the                            anus and advanced to the the cecum, identified by                            appendiceal orifice and ileocecal valve. The                            colonoscopy was performed without difficulty. The                            patient tolerated the procedure well. The quality                            of the bowel preparation was adequate. The                            ileocecal valve, appendiceal orifice, and rectum                            were photographed. Scope In: 8:34:43 AM Scope Out: 8:53:01 AM Scope Withdrawal Time: 0 hours 15 minutes 10 seconds  Total Procedure Duration: 0 hours 18 minutes 18 seconds  Findings:                 The perianal and digital rectal examinations were                            normal.                           A large amount of liquid stool was found in the                            entire colon, making visualization difficult.                            Extensive lavage was performed using copious  amounts of fluid, resulting in clearance with                            adequate visualization.                           Internal hemorrhoids were found during                            retroflexion. The hemorrhoids were small.                           The exam was otherwise without abnormality. Complications:            No immediate complications. Estimated blood loss:                            None. Estimated Blood Loss:     Estimated blood loss: none. Impression:               - Stool in the entire examined colon requiring                            extensive lavage but adequate views obtained..                           - Internal hemorrhoids.                           - The examination was otherwise normal.                           - No polyps. Recommendation:           - Patient has a contact number available for                             emergencies. The signs and symptoms of potential                            delayed complications were discussed with the                            patient. Return to normal activities tomorrow.                            Written discharge instructions were provided to the                            patient.                           - Resume previous diet.                           - Continue present medications.                           -  Patient would not be due for 10 years for another                            colonoscopy given this result, at that time will be                            68 years old. Likely no further surveillance exams                            are needed in this light. [name], MD 05/18/2023 8:59:00 AM This report has been signed electronically.

## 2023-05-18 NOTE — Patient Instructions (Signed)
 Resume previous diet. Continue present medications.  Handout on polyps provided.  YOU HAD AN ENDOSCOPIC PROCEDURE TODAY AT THE [facility]:   Refer to the procedure report that was given to you for any specific questions about what was found during the examination.  If the procedure report does not answer your questions, please call your gastroenterologist to clarify.  If you requested that your care partner not be given the details of your procedure findings, then the procedure report has been included in a sealed envelope for you to review at your convenience later.  YOU SHOULD EXPECT: Some feelings of bloating in the abdomen. Passage of more gas than usual.  Walking can help get rid of the air that was put into your GI tract during the procedure and reduce the bloating. If you had a lower endoscopy (such as a colonoscopy or flexible sigmoidoscopy) you may notice spotting of blood in your stool or on the toilet paper. If you underwent a bowel prep for your procedure, you may not have a normal bowel movement for a few days.  Please Note:  You might notice some irritation and congestion in your nose or some drainage.  This is from the oxygen used during your procedure.  There is no need for concern and it should clear up in a day or so.  SYMPTOMS TO REPORT IMMEDIATELY:  Following lower endoscopy (colonoscopy or flexible sigmoidoscopy):  Excessive amounts of blood in the stool  Significant tenderness or worsening of abdominal pains  Swelling of the abdomen that is new, acute  Fever of 100F or higher   For urgent or emergent issues, a gastroenterologist can be reached at any hour by calling [phone]. Do not use MyChart messaging for urgent concerns.    DIET:  We do recommend a small meal at first, but then you may proceed to your regular diet.  Drink plenty of fluids but you should avoid alcoholic beverages for 24 hours.  ACTIVITY:  You should plan to take it easy for the  rest of today and you should NOT DRIVE or use heavy machinery until tomorrow (because of the sedation medicines used during the test).    FOLLOW UP: Our staff will call the number listed on your records the next business day following your procedure.  We will call around 7:15- 8:00 am to check on you and address any questions or concerns that you may have regarding the information given to you following your procedure. If we do not reach you, we will leave a message.     If any biopsies were taken you will be contacted by phone or by letter within the next 1-3 weeks.  Please call us  at [phone] if you have not heard about the biopsies in 3 weeks.    SIGNATURES/CONFIDENTIALITY: You and/or your care partner have signed paperwork which will be entered into your electronic medical record.  These signatures attest to the fact that that the information above on your After Visit Summary has been reviewed and is understood.  Full responsibility of the confidentiality of this discharge information lies with you and/or your care-partner.

## 2023-05-18 NOTE — Telephone Encounter (Signed)
 Mr. Bownds contacted the on-call pager reporting difficulty tolerating the second half of bowel preparation for colonoscopy this morning.  States that he completed 4 tablets of Dulcolax and MiraLAX  and 32 ounces of Gatorade last night.  Reports passing large quantities of loose yellow, watery stool without sediment or particulate matter.  Awoke to try to complete the second half of his bowel preparation but describes feeling nauseated, lightheaded and feeling as though he may pass out.  Has not been vomiting.  Discussed measures for maintaining hydration.  Does not have ginger ale or antiemetics at home.  Discussed waiting another 30 to 60 minutes while resting to begin consuming at least a portion of the second half of bowel prep.  Recommended sipping the solution rather than drinking quickly.  From his description of his stools he may have potentially achieved a reasonable bowel cleanout at this time.  We discussed whether or not he wanted to consider canceling his procedure for tomorrow.  He felt that given the progress he had made with his bowel prep he would like to move forward and attempt to try to drink some additional solution.  Discussed that if he could not complete the bowel preparation but felt his stools were fairly clear he could proceed to the procedure unit in the morning.  If there is concern that his stools are not entirely clear an enema could potentially be administered. All questions answered and he acknowledged understanding of our conversation.

## 2023-05-18 NOTE — Progress Notes (Signed)
 Pt's states no medical or surgical changes since previsit or office visit.

## 2023-05-20 ENCOUNTER — Encounter: Payer: Self-pay | Admitting: Family Medicine

## 2023-05-21 ENCOUNTER — Telehealth: Payer: Self-pay | Admitting: Lactation Services

## 2023-05-21 ENCOUNTER — Other Ambulatory Visit: Payer: Self-pay

## 2023-05-21 MED ORDER — TIRZEPATIDE 2.5 MG/0.5ML ~~LOC~~ SOAJ: 2.5000 mg | SUBCUTANEOUS | 0 refills | Status: DC

## 2023-05-21 NOTE — Telephone Encounter (Signed)
  Follow up Call-     05/18/2023    7:50 AM  Call back number  Post procedure Call Back phone  # [phone]  Permission to leave phone message Yes     Patient questions:  Do you have a fever, pain , or abdominal swelling? No. Pain Score  0 *  Have you tolerated food without any problems? Yes.    Have you been able to return to your normal activities? Yes   Do you have any questions about your discharge instructions: Diet   No. Medications  No. Follow up visit  No.  Do you have questions or concerns about your Care? No.  Actions: * If pain score is 4 or above: No action needed, pain <4.

## 2023-05-29 ENCOUNTER — Ambulatory Visit (INDEPENDENT_AMBULATORY_CARE_PROVIDER_SITE_OTHER)

## 2023-05-29 VITALS — Ht 73.0 in | Wt 249.0 lb

## 2023-05-29 DIAGNOSIS — Z0000 Encounter for general adult medical examination without abnormal findings: Secondary | ICD-10-CM

## 2023-05-29 NOTE — Progress Notes (Signed)
 Subjective:   Jon Townsend is a 68 y.o. who presents for a Medicare Wellness preventive visit.  As a reminder, Annual Wellness Visits don't include a physical exam, and some assessments may be limited, especially if this visit is performed virtually. We may recommend an in-person follow-up visit with your provider if needed.  Visit Complete: Virtual I connected with  Jon Townsend on 05/29/23 by a audio enabled telemedicine application and verified that I am speaking with the correct person using two identifiers.  Patient Location: Home  Provider Location: Home Office  I discussed the limitations of evaluation and management by telemedicine. The patient expressed understanding and agreed to proceed.  Vital Signs: Because this visit was a virtual/telehealth visit, some criteria may be missing or patient reported. Any vitals not documented were not able to be obtained and vitals that have been documented are patient reported.    Persons Participating in Visit: Patient.  AWV Questionnaire: No: Patient Medicare AWV questionnaire was not completed prior to this visit.  Cardiac Risk Factors include: advanced age (>23men, >24 women);male gender;diabetes mellitus;hypertension     Objective:     Today's Vitals   05/29/23 0907  Weight: 249 lb (112.9 kg)  Height: 6\' 1"  (1.854 m)   Body mass index is 32.85 kg/m.     05/29/2023    9:14 AM 05/03/2022    1:40 PM 04/22/2021    1:58 PM 02/19/2020    2:53 PM 02/03/2020   12:30 PM  Advanced Directives  Does Patient Have a Medical Advance Directive? No No No No No  Would patient like information on creating a medical advance directive? No - Patient declined No - Patient declined No - Patient declined No - Patient declined No - Patient declined    Current Medications (verified) Outpatient Encounter Medications as of 05/29/2023  Medication Sig   atorvastatin  (LIPITOR) 40 MG tablet Take 1 tablet (40 mg total) by mouth daily.   b  complex vitamins capsule Take 1 capsule by mouth daily.   celecoxib  (CELEBREX ) 100 MG capsule Take 1 capsule twice daily as needed for pain. (Patient not taking: Reported on 05/18/2023)   Cholecalciferol 50 MCG (2000 UT) CAPS Take 1 capsule by mouth daily.   Coenzyme Q10 (COQ10) 100 MG CAPS Take 100 mg by mouth in the morning.   fexofenadine  (ALLEGRA ) 180 MG tablet Take 180 mg by mouth in the morning.   fluticasone  (FLONASE ) 50 MCG/ACT nasal spray Place 2 sprays into both nostrils daily.   glucose blood (ONETOUCH VERIO) test strip Use as once daily to check blood sugar.   latanoprost  (XALATAN ) 0.005 % ophthalmic solution Place 1 drop into both eyes at bedtime.   lisinopril -hydrochlorothiazide  (ZESTORETIC ) 10-12.5 MG tablet Take 1 tablet by mouth daily.   magnesium  oxide (MAG-OX) 400 (241.3 Mg) MG tablet Take 1 tablet (400 mg total) by mouth daily.   metFORMIN  (GLUCOPHAGE -XR) 500 MG 24 hr tablet Take 2 tabs daily.   OneTouch Delica Lancets 33G MISC Check blood sugars once daily   tamsulosin  (FLOMAX ) 0.4 MG CAPS capsule Take 1 capsule (0.4 mg total) by mouth daily.   tirzepatide (MOUNJARO) 2.5 MG/0.5ML Pen Inject 2.5 mg into the skin once a week.   No facility-administered encounter medications on file as of 05/29/2023.    Allergies (verified) Venlafaxine    History: Past Medical History:  Diagnosis Date   Allergy    BPH (benign prostatic hyperplasia)    Diabetes mellitus type 2 in obese    Glaucoma  Hypertension    Past Surgical History:  Procedure Laterality Date   Broken finger     TONSILLECTOMY     Family History  Problem Relation Age of Onset   Cancer Father    Early death Father    Diabetes Sister    Heart disease Sister    COPD Sister    Diabetes Sister    Colon cancer Neg Hx    Esophageal cancer Neg Hx    Stomach cancer Neg Hx    Rectal cancer Neg Hx    Social History   Socioeconomic History   Marital status: Married    Spouse name: Jon Townsend   Number of  children: 1   Years of education: Not on file   Highest education level: Master's degree (e.g., MA, MS, MEng, MEd, MSW, MBA)  Occupational History   Not on file  Tobacco Use   Smoking status: Former   Smokeless tobacco: Never   Tobacco comments:    Quit 35 years ago  Substance and Sexual Activity   Alcohol use: Never   Drug use: Never   Sexual activity: Not on file  Other Topics Concern   Not on file  Social History Narrative   Lives in [place] with wife, Jon Townsend.   1 daughter, 1 grandson   Social Drivers of Corporate investment banker Strain: Low Risk  (05/29/2023)   Overall Financial Resource Strain (CARDIA)    Difficulty of Paying Living Expenses: Not hard at all  Food Insecurity: No Food Insecurity (05/29/2023)   Hunger Vital Sign    Worried About Running Out of Food in the Last Year: Never true    Ran Out of Food in the Last Year: Never true  Transportation Needs: No Transportation Needs (05/29/2023)   PRAPARE - Administrator, Civil Service (Medical): No    Lack of Transportation (Non-Medical): No  Physical Activity: Sufficiently Active (05/29/2023)   Exercise Vital Sign    Days of Exercise per Week: 3 days    Minutes of Exercise per Session: 60 min  Stress: No Stress Concern Present (05/29/2023)   [employer] of Occupational Health - Occupational Stress Questionnaire    Feeling of Stress : Not at all  Social Connections: Moderately Isolated (05/29/2023)   Social Connection and Isolation Panel [NHANES]    Frequency of Communication with Friends and Family: More than three times a week    Frequency of Social Gatherings with Friends and Family: More than three times a week    Attends Religious Services: Never    Database administrator or Organizations: No    Attends Engineer, structural: Never    Marital Status: Married    Tobacco Counseling Counseling given: Not Answered Tobacco comments: Quit 35 years ago    Clinical  Intake:  Pre-visit preparation completed: Yes  Pain : No/denies pain     BMI - recorded: 32.85 Nutritional Status: BMI > 30  Obese Nutritional Risks: None Diabetes: Yes CBG done?: Yes (CBG 112 per patient) CBG resulted in Enter/ Edit results?: Yes Did pt. bring in CBG monitor from home?: No  Lab Results  Component Value Date   HGBA1C 6.8 (H) 03/13/2023   HGBA1C 6.4 09/05/2022   HGBA1C 6.6 (H) 03/07/2022     How often do you need to have someone help you when you read instructions, pamphlets, or other written materials from your doctor or pharmacy?: 1 - Never  Interpreter Needed?: No  Information entered by :: Jon Townsend  Jon Vanderschaaf LPN   Activities of Daily Living     05/29/2023    9:14 AM  In your present state of health, do you have any difficulty performing the following activities:  Hearing? 0  Vision? 0  Difficulty concentrating or making decisions? 0  Walking or climbing stairs? 0  Dressing or bathing? 0  Doing errands, shopping? 0  Preparing Food and eating ? N  Using the Toilet? N  In the past six months, have you accidently leaked urine? N  Do you have problems with loss of bowel control? N  Managing your Medications? N  Managing your Finances? N  Housekeeping or managing your Housekeeping? N    Patient Care Team: Jobe Mulder, DO as PCP - General (Family Medicine)  Indicate any recent Medical Services you may have received from other than [facility] providers in the past year (date may be approximate).     Assessment:    This is a routine wellness examination for [facility].  Hearing/Vision screen Hearing Screening - Comments:: Denies hearing difficulties   Vision Screening - Comments:: Wears rx glasses - up to date with routine eye exams with  [facility]   Goals Addressed               This Visit's Progress     Remain active (pt-stated)         Depression Screen     05/29/2023    9:12 AM 03/13/2023    9:07 AM 05/03/2022    1:42 PM  04/22/2021    1:56 PM 03/08/2021    9:14 AM 09/07/2020    9:26 AM 08/20/2019    9:38 AM  PHQ 2/9 Scores  PHQ - 2 Score 0 0 0 0 0 0 0    Fall Risk     05/29/2023    9:14 AM 03/13/2023    9:06 AM 05/02/2022    1:00 PM 03/07/2022    9:22 AM 04/22/2021    1:59 PM  Fall Risk   Falls in the past year? 0 0 0 0 0  Number falls in past yr: 0 0 0 0 0  Injury with Fall? 0 0 0 0 0  Risk for fall due to : No Fall Risks No Fall Risks No Fall Risks  No Fall Risks  Follow up Falls prevention discussed;Falls evaluation completed Falls evaluation completed Falls evaluation completed  Falls prevention discussed    MEDICARE RISK AT HOME:  Medicare Risk at Home Any stairs in or around the home?: Yes If so, are there any without handrails?: No Home free of loose throw rugs in walkways, pet beds, electrical cords, etc?: Yes Adequate lighting in your home to reduce risk of falls?: Yes Life alert?: No Use of a cane, walker or w/c?: No Grab bars in the bathroom?: Yes Shower chair or bench in shower?: No Elevated toilet seat or a handicapped toilet?: No  TIMED UP AND GO:  Was the test performed?  No  Cognitive Function: 6CIT completed        05/29/2023    9:14 AM 05/03/2022    1:46 PM 04/22/2021    2:03 PM  6CIT Screen  What Year? 0 points 0 points 0 points  What month? 0 points 0 points 0 points  What time? 0 points 0 points 0 points  Count back from 20 0 points 0 points 0 points  Months in reverse 0 points 0 points 0 points  Repeat phrase 0 points 0 points  0 points  Total Score 0 points 0 points 0 points    Immunizations Immunization History  Administered Date(s) Administered   Influenza, Seasonal, Injecte, Preservative Fre 10/04/2016   Influenza,inj,Quad PF,6+ Mos 10/20/2018   Influenza,inj,quad, With Preservative 09/29/2017   Influenza-Unspecified 10/20/2018   Pneumococcal Polysaccharide-23 01/23/2018   Zoster Recombinant(Shingrix) 04/26/2017, 07/04/2017    Screening Tests Health  Maintenance  Topic Date Due   FOOT EXAM  04/13/2023   DTaP/Tdap/Td (1 - Tdap) 03/12/2024 (Originally 11/14/1974)   Pneumonia Vaccine 12+ Years old (2 of 2 - PCV) 03/12/2024 (Originally 01/24/2019)   INFLUENZA VACCINE  08/10/2023   Diabetic kidney evaluation - Urine ACR  09/05/2023   HEMOGLOBIN A1C  09/13/2023   OPHTHALMOLOGY EXAM  11/07/2023   Diabetic kidney evaluation - eGFR measurement  03/12/2024   Medicare Annual Wellness (AWV)  05/28/2024   Colonoscopy  05/17/2028   Hepatitis C Screening  Completed   Zoster Vaccines- Shingrix  Completed   HPV VACCINES  Aged Out   Meningococcal B Vaccine  Aged Out   COVID-19 Vaccine  Discontinued    Health Maintenance  Health Maintenance Due  Topic Date Due   FOOT EXAM  04/13/2023   Health Maintenance Items Addressed:   Additional Screening:  Vision Screening: Recommended annual ophthalmology exams for early detection of glaucoma and other disorders of the eye.  Dental Screening: Recommended annual dental exams for proper oral hygiene  Community Resource Referral / Chronic Care Management: CRR required this visit?  No   CCM required this visit?  No   Plan:    I have personally reviewed and noted the following in the patient's chart:   Medical and social history Use of alcohol, tobacco or illicit drugs  Current medications and supplements including opioid prescriptions. Patient is not currently taking opioid prescriptions. Functional ability and status Nutritional status Physical activity Advanced directives List of other physicians Hospitalizations, surgeries, and ER visits in previous 12 months Vitals Screenings to include cognitive, depression, and falls Referrals and appointments  In addition, I have reviewed and discussed with patient certain preventive protocols, quality metrics, and best practice recommendations. A written personalized care plan for preventive services as well as general preventive health  recommendations were provided to patient.   Dewayne Ford, LPN   08/27/2991   After Visit Summary: (MyChart) Due to this being a telephonic visit, the after visit summary with patients personalized plan was offered to patient via MyChart   Notes: Nothing significant to report at this time.

## 2023-05-29 NOTE — Patient Instructions (Addendum)
 Mr. Biel , Thank you for taking time out of your busy schedule to complete your Annual Wellness Visit with me. I enjoyed our conversation and look forward to speaking with you again next year. I, as well as your care team,  appreciate your ongoing commitment to your health goals. Please review the following plan we discussed and let me know if I can assist you in the future. Your Game plan/ To Do List    Referrals: If you haven't heard from the office you've been referred to, please reach out to them at the phone provided.   Follow up Visits: Next Medicare AWV with our clinical staff: 06/03/24 @ 9:30a   Have you seen your provider in the last 6 months (3 months if uncontrolled diabetes)?  Next Office Visit with your provider: 09/18/23 @ 9a  Clinician Recommendations:  Aim for 30 minutes of exercise or brisk walking, 6-8 glasses of water, and 5 servings of fruits and vegetables each day.       This is a list of the screening recommended for you and due dates:  Health Maintenance  Topic Date Due   Complete foot exam   04/13/2023   DTaP/Tdap/Td vaccine (1 - Tdap) 03/12/2024*   Pneumonia Vaccine (2 of 2 - PCV) 03/12/2024*   Flu Shot  08/10/2023   Yearly kidney health urinalysis for diabetes  09/05/2023   Hemoglobin A1C  09/13/2023   Eye exam for diabetics  11/07/2023   Yearly kidney function blood test for diabetes  03/12/2024   Medicare Annual Wellness Visit  05/28/2024   Colon Cancer Screening  05/17/2028   Hepatitis C Screening  Completed   Zoster (Shingles) Vaccine  Completed   HPV Vaccine  Aged Out   Meningitis B Vaccine  Aged Out   COVID-19 Vaccine  Discontinued  *Topic was postponed. The date shown is not the original due date.    Advanced directives: (Declined) Advance directive discussed with you today. Even though you declined this today, please call our office should you change your mind, and we can give you the proper paperwork for you to fill out. Advance Care Planning is  important because it:  [x]  Makes sure you receive the medical care that is consistent with your values, goals, and preferences  [x]  It provides guidance to your family and loved ones and reduces their decisional burden about whether or not they are making the right decisions based on your wishes.  Follow the link provided in your after visit summary or read over the paperwork we have mailed to you to help you started getting your Advance Directives in place. If you need assistance in completing these, please reach out to us  so that we can help you!  See attachments for Preventive Care and Fall Prevention Tips.

## 2023-06-24 ENCOUNTER — Encounter: Payer: Self-pay | Admitting: Family Medicine

## 2023-06-25 ENCOUNTER — Other Ambulatory Visit: Payer: Self-pay | Admitting: Family Medicine

## 2023-06-25 MED ORDER — TIRZEPATIDE 15 MG/0.5ML ~~LOC~~ SOAJ: 15.0000 mg | SUBCUTANEOUS | 1 refills | Status: DC

## 2023-06-25 MED ORDER — TIRZEPATIDE 7.5 MG/0.5ML ~~LOC~~ SOAJ: 7.5000 mg | SUBCUTANEOUS | 0 refills | Status: AC

## 2023-06-25 MED ORDER — TIRZEPATIDE 5 MG/0.5ML ~~LOC~~ SOAJ: 5.0000 mg | SUBCUTANEOUS | 0 refills | Status: AC

## 2023-06-25 MED ORDER — TIRZEPATIDE 10 MG/0.5ML ~~LOC~~ SOAJ: 10.0000 mg | SUBCUTANEOUS | 0 refills | Status: DC

## 2023-06-25 MED ORDER — TIRZEPATIDE 12.5 MG/0.5ML ~~LOC~~ SOAJ: 12.5000 mg | SUBCUTANEOUS | 0 refills | Status: DC

## 2023-07-05 ENCOUNTER — Ambulatory Visit: Admitting: Podiatry

## 2023-07-05 DIAGNOSIS — M79675 Pain in left toe(s): Secondary | ICD-10-CM | POA: Diagnosis not present

## 2023-07-05 DIAGNOSIS — G629 Polyneuropathy, unspecified: Secondary | ICD-10-CM

## 2023-07-05 DIAGNOSIS — B351 Tinea unguium: Secondary | ICD-10-CM

## 2023-07-05 DIAGNOSIS — M79674 Pain in right toe(s): Secondary | ICD-10-CM

## 2023-07-05 NOTE — Progress Notes (Signed)
       Subjective:  Patient ID: Jon Townsend, male    DOB: [birth year],  MRN: [id number]  Jerrid Forgette presents to clinic today for:  Chief Complaint  Patient presents with   [facility] with possible callous, may just be dry skin. A1c 6.8 in March, no anti coag.    Patient notes nails are thick, discolored, elongated and painful in shoegear when trying to ambulate.  He request that the hallux nail corners be left longer to prevent ingrowing nails.  He has some questions regarding neuropathy and supplements for this condition.  He stopped taking his gabapentin  approximately 6 months ago and has not noticed any worsening of his neuropathy.  He already takes vitamin B12 supplements.  PCP is Frann [name], DO.  Past Medical History:  Diagnosis Date   Allergy    BPH (benign prostatic hyperplasia)    Diabetes mellitus type 2 in obese    Glaucoma    Hypertension    Past Surgical History:  Procedure Laterality Date   Broken finger     TONSILLECTOMY     Allergies  Allergen Reactions   Venlafaxine  Other (See Comments)    Caused Insomnia    Review of Systems: Negative except as noted in the HPI.  Objective:  Jakob Kimberlin is a pleasant 68 y.o. male in NAD. AAO x 3.  Vascular Examination: Capillary refill time is 3-5 seconds to toes bilateral. Palpable pedal pulses b/l LE. Digital hair present b/l.  Skin temperature gradient WNL b/l. No varicosities b/l. No cyanosis noted b/l.   Dermatological Examination: Pedal skin with normal turgor, texture and tone b/l. No open wounds. No interdigital macerations b/l. Toenails x10 are 3mm thick, discolored, dystrophic with subungual debris. There is pain with compression of the nail plates.  They are elongated x10     Latest Ref Rng & Units 03/13/2023    9:40 AM 09/05/2022    9:25 AM  Hemoglobin A1C  Hemoglobin-A1c 4.6 - 6.5 % 6.8  6.4    Assessment/Plan: 1. Pain due to onychomycosis of toenails of both feet   2.  Polyneuropathy    The mycotic toenails were sharply debrided x10 with sterile nail nippers and a power debriding burr to decrease bulk/thickness and length.    Discussed over-the-counter medications that are important for nerve health, including folic acid, alpha lipoic acid, vitamin B6 and vitamin B12 supplements.  Also recommended lavender capsules that he could take in the evening if he is having any neuropathy symptoms in the evening or at bedtime.  These have a calming effect on the nerves.  Return in about 3 months (around 10/05/2023) for [facility].   Awanda CHARM Imperial, DPM, FACFAS [facility]     [address] [place], [place] [address]                Office [phone]  Fax [phone]

## 2023-07-21 ENCOUNTER — Encounter: Payer: Self-pay | Admitting: Family Medicine

## 2023-08-17 ENCOUNTER — Other Ambulatory Visit: Payer: Self-pay | Admitting: Family Medicine

## 2023-08-18 ENCOUNTER — Encounter: Payer: Self-pay | Admitting: Family Medicine

## 2023-08-20 ENCOUNTER — Other Ambulatory Visit: Payer: Self-pay

## 2023-08-20 ENCOUNTER — Other Ambulatory Visit (HOSPITAL_COMMUNITY): Payer: Self-pay

## 2023-08-20 ENCOUNTER — Telehealth: Payer: Self-pay

## 2023-08-20 MED ORDER — TIRZEPATIDE 10 MG/0.5ML ~~LOC~~ SOAJ: 10.0000 mg | SUBCUTANEOUS | 0 refills | Status: DC

## 2023-08-20 NOTE — Telephone Encounter (Signed)
 Pharmacy Patient Advocate Encounter  Received notification from [employer] [place] [employer] that Prior Authorization for Mounjaro  5mg /0.46ml has been CANCELLED due to a prior authorization is not required.

## 2023-08-20 NOTE — Telephone Encounter (Signed)
 Pharmacy Patient Advocate Encounter   Received notification from RX Request Messages that prior authorization for Mounjaro  5mg /0.9ml is required/requested.   Insurance verification completed.   The patient is insured through [employer]  .   Per test claim: PA required; PA submitted to above mentioned insurance via Latent Key/confirmation #/EOC [id number] Status is pending

## 2023-08-22 ENCOUNTER — Other Ambulatory Visit: Payer: Self-pay | Admitting: Family Medicine

## 2023-08-22 MED ORDER — TIRZEPATIDE 7.5 MG/0.5ML ~~LOC~~ SOAJ: 7.5000 mg | SUBCUTANEOUS | 0 refills | Status: DC

## 2023-09-18 ENCOUNTER — Ambulatory Visit: Payer: Self-pay | Admitting: Family Medicine

## 2023-09-18 ENCOUNTER — Ambulatory Visit (INDEPENDENT_AMBULATORY_CARE_PROVIDER_SITE_OTHER): Admitting: Family Medicine

## 2023-09-18 ENCOUNTER — Encounter: Payer: Self-pay | Admitting: Family Medicine

## 2023-09-18 VITALS — BP 118/78 | HR 72 | Temp 98.0°F | Resp 16 | Ht 73.0 in | Wt 249.6 lb

## 2023-09-18 DIAGNOSIS — E1169 Type 2 diabetes mellitus with other specified complication: Secondary | ICD-10-CM

## 2023-09-18 DIAGNOSIS — Z7985 Long-term (current) use of injectable non-insulin antidiabetic drugs: Secondary | ICD-10-CM | POA: Diagnosis not present

## 2023-09-18 DIAGNOSIS — E669 Obesity, unspecified: Secondary | ICD-10-CM | POA: Diagnosis not present

## 2023-09-18 DIAGNOSIS — I10 Essential (primary) hypertension: Secondary | ICD-10-CM

## 2023-09-18 LAB — COMPREHENSIVE METABOLIC PANEL WITH GFR
ALT: 16 U/L (ref 0–53)
AST: 17 U/L (ref 0–37)
Albumin: 4.4 g/dL (ref 3.5–5.2)
Alkaline Phosphatase: 83 U/L (ref 39–117)
BUN: 23 mg/dL (ref 6–23)
CO2: 32 meq/L (ref 19–32)
Calcium: 9.5 mg/dL (ref 8.4–10.5)
Chloride: 97 meq/L (ref 96–112)
Creatinine, Ser: 1.06 mg/dL (ref 0.40–1.50)
GFR: 71.44 mL/min (ref >60.00)
Glucose, Bld: 105 mg/dL — ABNORMAL HIGH (ref 70–99)
Potassium: 4.4 meq/L (ref 3.5–5.1)
Sodium: 136 meq/L (ref 135–145)
Total Bilirubin: 1.3 mg/dL — ABNORMAL HIGH (ref 0.2–1.2)
Total Protein: 6.9 g/dL (ref 6.0–8.3)

## 2023-09-18 LAB — LIPID PANEL
Cholesterol: 107 mg/dL (ref 0–200)
HDL: 34.5 mg/dL — ABNORMAL LOW (ref >39.00)
LDL Cholesterol: 53 mg/dL (ref 0–99)
NonHDL: 72.59
Total CHOL/HDL Ratio: 3
Triglycerides: 100 mg/dL (ref 0.0–149.0)
VLDL: 20 mg/dL (ref 0.0–40.0)

## 2023-09-18 LAB — HEMOGLOBIN A1C: Hgb A1c MFr Bld: 6.7 % — ABNORMAL HIGH (ref 4.6–6.5)

## 2023-09-18 LAB — MICROALBUMIN / CREATININE URINE RATIO
Creatinine,U: 55.4 mg/dL
Microalb Creat Ratio: UNDETERMINED mg/g (ref 0.0–30.0)
Microalb, Ur: <0.7 mg/dL

## 2023-09-18 MED ORDER — TIRZEPATIDE 7.5 MG/0.5ML ~~LOC~~ SOAJ: 7.5000 mg | SUBCUTANEOUS | 5 refills | Status: DC

## 2023-09-18 NOTE — Progress Notes (Signed)
 Subjective:   Chief Complaint  Patient presents with   Diabetes    Diabetes     Jon Townsend is a 68 y.o. male here for follow-up of diabetes.   Jon Townsend's self monitored glucose range is low 100's.  Patient denies hypoglycemic reactions. He checks his glucose levels 1 time per week. Patient does not require insulin .   Medications include: metformin  XR 1000 mg/d, Mounjaro  7.5 mg/week.  Diet is overall healthy.  Exercise: lifting wts, walking  Hypertension Patient presents for hypertension follow up. He does not monitor home blood pressures. He is compliant with medications- Zestoretic  10-12.5 mg/d. Patient has these side effects of medication: none Diet/exercise as above. No CP or SOB.   Past Medical History:  Diagnosis Date   Allergy    BPH (benign prostatic hyperplasia)    Diabetes mellitus type 2 in obese    Glaucoma    Hypertension      Related testing: Retinal exam: Done Pneumovax: done  Objective:  BP 118/78 (BP Location: Left Arm, Patient Position: Sitting)   Pulse 72   Temp 98 F (36.7 C) (Oral)   Resp 16   Ht 6' 1 (1.854 m)   Wt 249 lb 9.6 oz (113.2 kg)   SpO2 99%   BMI 32.93 kg/m  General:  Well developed, well nourished, in no apparent distress Skin:  Warm, no pallor or diaphoresis Head:  Normocephalic, atraumatic Eyes:  Pupils equal and round, sclera anicteric without injection  Lungs:  CTAB, no access msc use Cardio:  RRR, no bruits, no LE edema Musculoskeletal:  Symmetrical muscle groups noted without atrophy or deformity Neuro:  Sensation intact to pinprick on feet Psych: Age appropriate judgment and insight  Assessment:   Type 2 diabetes mellitus with obesity (HCC) - Plan: Comprehensive metabolic panel with GFR, Lipid panel, Hemoglobin A1c, Microalbumin / creatinine urine ratio  Essential hypertension   Plan:   Chronic, hopefully stable.  Continue Mounjaro  7.5 mg weekly, metformin  XR 500 mg daily.  May be able to stop the latter  depending on results.  Counseled on diet and exercise. Chronic, stable.  Continue Zestoretic  10-12.5 mg daily. F/u in 6 mo. The patient voiced understanding and agreement to the plan.  Jon [place], DO 09/18/23 12:21 PM

## 2023-09-18 NOTE — Patient Instructions (Signed)
 Give us  2-3 business days to get the results of your labs back.   Keep the diet clean and stay active.  Let us  know if you need anything.

## 2023-09-19 ENCOUNTER — Other Ambulatory Visit: Payer: Self-pay

## 2023-09-19 ENCOUNTER — Other Ambulatory Visit: Payer: Self-pay | Admitting: Family Medicine

## 2023-09-19 DIAGNOSIS — E1169 Type 2 diabetes mellitus with other specified complication: Secondary | ICD-10-CM

## 2023-09-19 MED ORDER — TIRZEPATIDE 10 MG/0.5ML ~~LOC~~ SOAJ: 10.0000 mg | SUBCUTANEOUS | 3 refills | Status: DC

## 2023-09-19 NOTE — Telephone Encounter (Signed)
 Called pt and lab appt scehdule

## 2023-09-30 ENCOUNTER — Encounter: Payer: Self-pay | Admitting: Family Medicine

## 2023-09-30 DIAGNOSIS — I10 Essential (primary) hypertension: Secondary | ICD-10-CM

## 2023-10-01 MED ORDER — LISINOPRIL-HYDROCHLOROTHIAZIDE 10-12.5 MG PO TABS: 1.0000 | ORAL_TABLET | Freq: Every day | ORAL | 3 refills | Status: AC

## 2023-10-04 ENCOUNTER — Ambulatory Visit: Admitting: Podiatry

## 2023-10-04 DIAGNOSIS — L84 Corns and callosities: Secondary | ICD-10-CM

## 2023-10-04 DIAGNOSIS — E1151 Type 2 diabetes mellitus with diabetic peripheral angiopathy without gangrene: Secondary | ICD-10-CM

## 2023-10-04 DIAGNOSIS — M79675 Pain in left toe(s): Secondary | ICD-10-CM

## 2023-10-04 DIAGNOSIS — B351 Tinea unguium: Secondary | ICD-10-CM

## 2023-10-04 DIAGNOSIS — M79674 Pain in right toe(s): Secondary | ICD-10-CM

## 2023-10-04 NOTE — Progress Notes (Signed)
    Subjective:  Patient ID: Jon Townsend, male    DOB: 11/08/1955,  MRN: [id number]  Jon Townsend presents to clinic today for:  Chief Complaint  Patient presents with   Jon Townsend    Jon Townsend with callous on hallux and dry skin on plantar.  A1c 6.7 on 09/18/23 No anti coag.    Patient notes nails are thick and elongated, causing pain in shoe gear when ambulating.  He has painful calluses bilateral submet 2.  He notes he had to go up a half size on his new sketcher shoes and might be sliding a bit.  PCP is Jon [name], DO.  Last seen 09/18/2023  Past Medical History:  Diagnosis Date   Allergy    BPH (benign prostatic hyperplasia)    Diabetes mellitus type 2 in obese    Glaucoma    Hypertension    Allergies  Allergen Reactions   Venlafaxine  Other (See Comments)    Caused Insomnia    Objective:  Jon Townsend is a pleasant 68 y.o. male in NAD. AAO x 3.  Vascular Examination: Patient has palpable DP pulse, absent PT pulse bilateral.  Delayed capillary refill bilateral toes.  Sparse digital hair bilateral.  Proximal to distal cooling WNL bilateral.    Dermatological Examination: Interspaces are clear with no open lesions noted bilateral.  Skin is shiny and atrophic bilateral.  Nails are 3-37mm thick, with yellowish/brown discoloration, subungual debris and distal onycholysis x10.  There is pain with compression of nails x10.  There are hyperkeratotic lesions noted bilateral submet 2.     Latest Ref Rng & Units 09/18/2023    9:48 AM 03/13/2023    9:40 AM  Hemoglobin A1C  Hemoglobin-A1c 4.6 - 6.5 % 6.7  6.8    Patient qualifies for at-risk foot care because of diabetes with PVD.  Assessment/Plan: 1. Pain due to onychomycosis of toenails of both feet   2. Callus of foot   3. Type II diabetes mellitus with peripheral circulatory disorder (HCC)     Mycotic nails x10 were sharply debrided with sterile nail nippers and power debriding burr to decrease bulk and  length.  Hyperkeratotic lesions bilateral submet 2 were shaved with #312 blade.  Return in about 3 months (around 01/03/2024) for [facility].   Jon Townsend, DPM, FACFAS [facility]     [address] [place], [place] [address]                Office [phone]  Fax [phone]

## 2023-10-14 ENCOUNTER — Encounter: Payer: Self-pay | Admitting: Family Medicine

## 2023-10-15 ENCOUNTER — Other Ambulatory Visit: Payer: Self-pay

## 2023-10-15 MED ORDER — TIRZEPATIDE 10 MG/0.5ML ~~LOC~~ SOAJ: 10.0000 mg | SUBCUTANEOUS | 3 refills | Status: DC

## 2023-11-08 ENCOUNTER — Encounter: Payer: Self-pay | Admitting: Family Medicine

## 2023-11-08 ENCOUNTER — Other Ambulatory Visit: Payer: Self-pay | Admitting: Family Medicine

## 2023-11-08 MED ORDER — TIRZEPATIDE 12.5 MG/0.5ML ~~LOC~~ SOAJ: 12.5000 mg | SUBCUTANEOUS | 3 refills | Status: DC

## 2023-11-12 ENCOUNTER — Encounter: Payer: Self-pay | Admitting: Radiology

## 2023-11-15 DIAGNOSIS — L570 Actinic keratosis: Secondary | ICD-10-CM | POA: Diagnosis not present

## 2023-11-15 DIAGNOSIS — L820 Inflamed seborrheic keratosis: Secondary | ICD-10-CM | POA: Diagnosis not present

## 2023-11-15 DIAGNOSIS — D485 Neoplasm of uncertain behavior of skin: Secondary | ICD-10-CM | POA: Diagnosis not present

## 2023-11-15 DIAGNOSIS — D225 Melanocytic nevi of trunk: Secondary | ICD-10-CM | POA: Diagnosis not present

## 2023-11-15 DIAGNOSIS — L821 Other seborrheic keratosis: Secondary | ICD-10-CM | POA: Diagnosis not present

## 2023-11-15 DIAGNOSIS — D2239 Melanocytic nevi of other parts of face: Secondary | ICD-10-CM | POA: Diagnosis not present

## 2023-11-15 DIAGNOSIS — L814 Other melanin hyperpigmentation: Secondary | ICD-10-CM | POA: Diagnosis not present

## 2023-12-03 ENCOUNTER — Other Ambulatory Visit: Payer: Self-pay

## 2023-12-03 ENCOUNTER — Encounter: Payer: Self-pay | Admitting: Family Medicine

## 2023-12-03 MED ORDER — TIRZEPATIDE 15 MG/0.5ML ~~LOC~~ SOAJ: 15.0000 mg | SUBCUTANEOUS | 0 refills | Status: DC

## 2023-12-12 ENCOUNTER — Ambulatory Visit: Admitting: Podiatry

## 2023-12-12 ENCOUNTER — Ambulatory Visit (INDEPENDENT_AMBULATORY_CARE_PROVIDER_SITE_OTHER)

## 2023-12-12 DIAGNOSIS — M7752 Other enthesopathy of left foot: Secondary | ICD-10-CM

## 2023-12-12 DIAGNOSIS — M722 Plantar fascial fibromatosis: Secondary | ICD-10-CM

## 2023-12-12 DIAGNOSIS — M7732 Calcaneal spur, left foot: Secondary | ICD-10-CM

## 2023-12-12 MED ORDER — MELOXICAM 15 MG PO TABS: 15.0000 mg | ORAL_TABLET | Freq: Every day | ORAL | 2 refills | Status: AC

## 2023-12-12 NOTE — Patient Instructions (Signed)

## 2023-12-12 NOTE — Progress Notes (Signed)
 Chief Complaint  Patient presents with   Plantar Fasciitis    Possible PF in the left foot, midfoot to heel. Worse in am and after rest. It does not bother him as bad if he keeps moving. Today the shoe gear is painful. He does not a cam walker or PF support brace.  A1c 6.7 in Sept, No anti coag.    Discussed the use of AI scribe software for clinical note transcription with the patient, who gave verbal consent to proceed.  History of Present Illness Jon Townsend is a 68 year old male who presents with left foot pain.  He experiences pain at the bottom of his left foot, extending from the back of the heel to the mid-foot area. 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 The current episode has been ongoing for less than two weeks.  He recalls engaging in activities such as walking and picking pecans, which may have contributed to the onset of symptoms. He has been using ibuprofen for pain relief, which he has tolerated well in the past, and also mentions using Salonpas patches for temporary relief.  The pain is most pronounced when he first steps out of bed in the morning, causing him to limp. He wears [employer]chers shoes, which he finds comfortable due to their arch support, although he recently acquired a new pair that may have influenced his symptoms.  His daughter is undergoing treatment for a rare form of cancer, which has recurred after initial treatment. This situation has been a source of stress for him, as he is involved in her care and transportation to treatment sessions.    Past Medical History:  Diagnosis Date   Allergy    BPH (benign prostatic hyperplasia)    Diabetes mellitus type 2 in obese    Glaucoma    Hypertension    Past Surgical History:  Procedure Laterality Date   Broken finger     TONSILLECTOMY     Allergies  Allergen Reactions   Venlafaxine  Other (See Comments)     Caused Insomnia   Physical Exam Palpable pedal pulses are noted.  No open lesions are noted.  No ecchymosis or erythema is appreciated.  There is pain on palpation to the plantar medial and plantar central portions of the left heel.  No gaps or nodules within the plantar fascia.  No pain to the Achilles tendon.  Ankle dorsiflexion does not reproduce pain.  Manual muscle testing 5/5.  Epicritic sensation intact.  Negative Tinel's sign of the posterior tibial nerve.     Results RADIOLOGY (left foot 3 weightbearing views, 12/12/2023) Foot X-ray: Inferior calcaneal spur noted.  No fracture seen  PROCEDURE Corticosteroid injection Description: Cold spray applied to the foot. Injection administered from the lateral aspect of the heel.   Assessment/Plan of Care: 1. Plantar fasciitis of left foot   2. Inferior calcaneal spur of left foot   3. Bursitis of heel, left      Meds ordered this encounter  Medications   meloxicam  (MOBIC ) 15 MG tablet    Sig: Take 1 tablet (15 mg total) by mouth daily.    Dispense:  30 tablet    Refill:  2   FOR HOME USE ONLY DME NIGHT SPLINT Assessment & Plan Plantar fasciitis of left foot Plantar fasciitis of the left foot with pain from the heel to the ball of the foot, exacerbated by kneeling and squatting. Symptoms  have persisted for less than two weeks. X-ray reveals a heel spur, indicating chronic strain and micro bleeding of the plantar fascia. Pain is typical upon first stepping out of bed in the morning. Previous episodes resolved with ibuprofen. Current treatment options include cortisone injection, night splint, stretching exercises, and prescription anti-inflammatories. Cortisone injection is preferred over oral ibuprofen due to reduced gastrointestinal side effects. He opted for cortisone injection despite concerns about discomfort. - Administered cortisone injection to the left plantar heel.  This consisted of a mixture of 1% lidocaine  plain 0.5%  Marcaine  plain and Kenalog 10 for total of 1.25 cc administered to the inferior heel.  Band-Aid was applied.  He tolerated this well. - Prescribed prescription anti-inflammatories to reduce gastrointestinal side effects compared to ibuprofen. - Provided a night splint to aid with morning pain.  This is a prefabricated night splint/AFO device with a soft interface material meant to be worn when nonweightbearing/sleeping.  Insurance waiver reviewed and proof of delivery signed via motion MD. - Instructed on stretching exercises for plantar fasciitis. - Advised icing the affected area and avoiding heat if throbbing occurs. - Instructed to avoid hot tubs for 24 hours post-injection.  Follow-up 4 weeks   Emet Rafanan DSABRA [place], DPM, FACFAS [facility]     [address] [place], [place] [address]                Office [phone]  Fax [phone]

## 2023-12-13 DIAGNOSIS — K080 Exfoliation of teeth due to systemic causes: Secondary | ICD-10-CM | POA: Diagnosis not present

## 2023-12-17 DIAGNOSIS — H524 Presbyopia: Secondary | ICD-10-CM | POA: Diagnosis not present

## 2023-12-18 ENCOUNTER — Other Ambulatory Visit

## 2023-12-20 ENCOUNTER — Ambulatory Visit: Payer: Self-pay | Admitting: Family Medicine

## 2023-12-20 ENCOUNTER — Other Ambulatory Visit (INDEPENDENT_AMBULATORY_CARE_PROVIDER_SITE_OTHER)

## 2023-12-20 ENCOUNTER — Encounter: Payer: Self-pay | Admitting: Family Medicine

## 2023-12-20 DIAGNOSIS — E669 Obesity, unspecified: Secondary | ICD-10-CM

## 2023-12-20 DIAGNOSIS — E119 Type 2 diabetes mellitus without complications: Secondary | ICD-10-CM | POA: Diagnosis not present

## 2023-12-20 LAB — HEMOGLOBIN A1C: Hgb A1c MFr Bld: 6.2 % (ref 4.6–6.5)

## 2023-12-21 ENCOUNTER — Encounter: Payer: Self-pay | Admitting: Family Medicine

## 2023-12-21 ENCOUNTER — Telehealth: Payer: Self-pay

## 2023-12-21 ENCOUNTER — Other Ambulatory Visit (HOSPITAL_COMMUNITY): Payer: Self-pay

## 2023-12-21 NOTE — Telephone Encounter (Signed)
 Pharmacy Patient Advocate Encounter  Received notification from [facility] that Prior Authorization for Mounjaro  15mg /0.38ml has been CANCELLED due to Prior authorization is not required

## 2023-12-21 NOTE — Telephone Encounter (Signed)
 Pharmacy Patient Advocate Encounter   Received notification from Patient Advice Request messages that prior authorization for Mounjaro  15mg /0.60ml is required/requested.   Insurance verification completed.   The patient is insured through [facility].   Per test claim: PA required; PA submitted to above mentioned insurance via Latent Key/confirmation #/EOC [id number] Status is pending

## 2023-12-28 ENCOUNTER — Ambulatory Visit: Admitting: Podiatry

## 2023-12-28 DIAGNOSIS — M79675 Pain in left toe(s): Secondary | ICD-10-CM

## 2023-12-28 DIAGNOSIS — B351 Tinea unguium: Secondary | ICD-10-CM | POA: Diagnosis not present

## 2023-12-28 DIAGNOSIS — M79674 Pain in right toe(s): Secondary | ICD-10-CM | POA: Diagnosis not present

## 2023-12-28 NOTE — Progress Notes (Unsigned)
 Nails x10.  PF resolved

## 2024-01-09 ENCOUNTER — Ambulatory Visit: Admitting: Podiatry

## 2024-01-14 ENCOUNTER — Other Ambulatory Visit: Payer: Self-pay | Admitting: Family Medicine

## 2024-01-14 ENCOUNTER — Encounter: Payer: Self-pay | Admitting: Family Medicine

## 2024-01-14 MED ORDER — TIRZEPATIDE 15 MG/0.5ML ~~LOC~~ SOAJ: 15.0000 mg | SUBCUTANEOUS | 0 refills | Status: AC

## 2024-01-21 ENCOUNTER — Encounter: Payer: Self-pay | Admitting: Family Medicine

## 2024-02-11 ENCOUNTER — Telehealth: Payer: Self-pay

## 2024-02-11 ENCOUNTER — Other Ambulatory Visit (HOSPITAL_COMMUNITY): Payer: Self-pay

## 2024-02-11 NOTE — Telephone Encounter (Signed)
 Pharmacy Patient Advocate Encounter  Received notification from [employer] that Prior Authorization for Mounjaro  has been APPROVED from 02/11/2024 to 2/2/20027. Ran test claim, Copay is $436.00. This test claim was processed through [facility]- copay amounts may vary at other pharmacies due to pharmacy/plan contracts, or as the patient moves through the different stages of their insurance plan.   PA #/Case ID/Reference #: [id number]  Pt. Has deductible to meet

## 2024-02-11 NOTE — Telephone Encounter (Signed)
 Last PA note is from December, have we resubmitted?

## 2024-02-11 NOTE — Telephone Encounter (Signed)
 Pharmacy Patient Advocate Encounter   Received notification from Physician's Office that prior authorization for Mounjaro  is required/requested.   Insurance verification completed.   The patient is insured through [employer].   Per test claim: PA required; PA submitted to above mentioned insurance via Latent Key/confirmation #/EOC [facility] Status is pending

## 2024-03-18 ENCOUNTER — Ambulatory Visit: Admitting: Family Medicine

## 2024-03-27 ENCOUNTER — Ambulatory Visit: Admitting: Podiatry

## 2024-06-03 ENCOUNTER — Ambulatory Visit
